# Patient Record
Sex: Female | Born: 1955 | Race: White | Hispanic: No | Marital: Married | Smoking: Former smoker
Health system: Southern US, Community
[De-identification: ages and names within clinical notes are randomized; demographics above are authoritative.]

## PROBLEM LIST (undated history)

## (undated) DIAGNOSIS — K219 Gastro-esophageal reflux disease without esophagitis: Secondary | ICD-10-CM

## (undated) DIAGNOSIS — F32A Depression, unspecified: Secondary | ICD-10-CM

## (undated) DIAGNOSIS — E119 Type 2 diabetes mellitus without complications: Secondary | ICD-10-CM

## (undated) DIAGNOSIS — F329 Major depressive disorder, single episode, unspecified: Secondary | ICD-10-CM

## (undated) DIAGNOSIS — G8929 Other chronic pain: Secondary | ICD-10-CM

## (undated) DIAGNOSIS — R569 Unspecified convulsions: Secondary | ICD-10-CM

## (undated) DIAGNOSIS — F419 Anxiety disorder, unspecified: Secondary | ICD-10-CM

## (undated) DIAGNOSIS — G629 Polyneuropathy, unspecified: Secondary | ICD-10-CM

## (undated) DIAGNOSIS — I10 Essential (primary) hypertension: Secondary | ICD-10-CM

## (undated) DIAGNOSIS — G4730 Sleep apnea, unspecified: Secondary | ICD-10-CM

## (undated) HISTORY — DX: Polyneuropathy, unspecified: G62.9

## (undated) HISTORY — PX: CHOLECYSTECTOMY: SHX55

## (undated) HISTORY — PX: ABDOMINAL HYSTERECTOMY: SHX81

## (undated) HISTORY — PX: BACK SURGERY: SHX140

## (undated) HISTORY — PX: CARPAL TUNNEL RELEASE: SHX101

## (undated) HISTORY — PX: PARS PLANA VITRECTOMY W/ REPAIR OF MACULAR HOLE: SHX2170

## (undated) HISTORY — DX: Sleep apnea, unspecified: G47.30

## (undated) HISTORY — DX: Morbid (severe) obesity due to excess calories: E66.01

## (undated) HISTORY — DX: Other chronic pain: G89.29

## (undated) HISTORY — DX: Anxiety disorder, unspecified: F41.9

---

## 2000-02-22 ENCOUNTER — Encounter: Payer: Self-pay | Admitting: Gastroenterology

## 2001-11-26 ENCOUNTER — Encounter: Payer: Self-pay | Admitting: Podiatry

## 2001-11-30 ENCOUNTER — Ambulatory Visit (HOSPITAL_COMMUNITY): Admission: RE | Admit: 2001-11-30 | Discharge: 2001-11-30 | Payer: Self-pay

## 2002-04-08 HISTORY — PX: FOOT SURGERY: SHX648

## 2002-05-10 ENCOUNTER — Ambulatory Visit (HOSPITAL_COMMUNITY): Admission: RE | Admit: 2002-05-10 | Discharge: 2002-05-10 | Payer: Self-pay | Admitting: Family Medicine

## 2002-05-10 ENCOUNTER — Encounter: Payer: Self-pay | Admitting: Family Medicine

## 2002-08-30 ENCOUNTER — Ambulatory Visit: Admission: RE | Admit: 2002-08-30 | Discharge: 2002-08-30 | Payer: Self-pay | Admitting: Family Medicine

## 2002-10-12 ENCOUNTER — Inpatient Hospital Stay (HOSPITAL_COMMUNITY): Admission: AD | Admit: 2002-10-12 | Discharge: 2002-10-16 | Payer: Self-pay | Admitting: Family Medicine

## 2002-10-12 ENCOUNTER — Encounter: Payer: Self-pay | Admitting: Family Medicine

## 2002-10-14 HISTORY — PX: COLONOSCOPY: SHX174

## 2002-12-31 ENCOUNTER — Encounter: Payer: Self-pay | Admitting: Neurological Surgery

## 2002-12-31 ENCOUNTER — Ambulatory Visit (HOSPITAL_COMMUNITY): Admission: RE | Admit: 2002-12-31 | Discharge: 2002-12-31 | Payer: Self-pay | Admitting: Neurological Surgery

## 2003-02-14 ENCOUNTER — Ambulatory Visit (HOSPITAL_COMMUNITY): Admission: RE | Admit: 2003-02-14 | Discharge: 2003-02-14 | Payer: Self-pay | Admitting: Family Medicine

## 2003-05-11 ENCOUNTER — Ambulatory Visit (HOSPITAL_COMMUNITY): Admission: RE | Admit: 2003-05-11 | Discharge: 2003-05-11 | Payer: Self-pay | Admitting: Family Medicine

## 2003-06-15 ENCOUNTER — Encounter (HOSPITAL_COMMUNITY): Admission: RE | Admit: 2003-06-15 | Discharge: 2003-07-15 | Payer: Self-pay | Admitting: Family Medicine

## 2004-05-23 ENCOUNTER — Ambulatory Visit (HOSPITAL_COMMUNITY): Admission: RE | Admit: 2004-05-23 | Discharge: 2004-05-23 | Payer: Self-pay | Admitting: Family Medicine

## 2004-10-03 ENCOUNTER — Ambulatory Visit (HOSPITAL_COMMUNITY): Admission: RE | Admit: 2004-10-03 | Discharge: 2004-10-03 | Payer: Self-pay | Admitting: Family Medicine

## 2004-11-07 ENCOUNTER — Ambulatory Visit (HOSPITAL_COMMUNITY): Admission: RE | Admit: 2004-11-07 | Discharge: 2004-11-07 | Payer: Self-pay | Admitting: Obstetrics & Gynecology

## 2004-11-19 ENCOUNTER — Encounter: Admission: RE | Admit: 2004-11-19 | Discharge: 2004-11-19 | Payer: Self-pay | Admitting: Family Medicine

## 2004-11-30 ENCOUNTER — Emergency Department (HOSPITAL_COMMUNITY): Admission: EM | Admit: 2004-11-30 | Discharge: 2004-12-01 | Payer: Self-pay | Admitting: *Deleted

## 2004-12-24 ENCOUNTER — Ambulatory Visit: Payer: Self-pay | Admitting: Internal Medicine

## 2005-01-02 ENCOUNTER — Encounter (INDEPENDENT_AMBULATORY_CARE_PROVIDER_SITE_OTHER): Payer: Self-pay | Admitting: *Deleted

## 2005-01-02 ENCOUNTER — Ambulatory Visit (HOSPITAL_COMMUNITY): Admission: RE | Admit: 2005-01-02 | Discharge: 2005-01-03 | Payer: Self-pay | Admitting: General Surgery

## 2005-03-25 ENCOUNTER — Ambulatory Visit (HOSPITAL_COMMUNITY): Admission: RE | Admit: 2005-03-25 | Discharge: 2005-03-25 | Payer: Self-pay | Admitting: Neurological Surgery

## 2005-04-15 ENCOUNTER — Ambulatory Visit (HOSPITAL_COMMUNITY): Admission: RE | Admit: 2005-04-15 | Discharge: 2005-04-15 | Payer: Self-pay | Admitting: Neurological Surgery

## 2005-05-30 ENCOUNTER — Inpatient Hospital Stay (HOSPITAL_COMMUNITY): Admission: RE | Admit: 2005-05-30 | Discharge: 2005-06-05 | Payer: Self-pay | Admitting: Neurological Surgery

## 2005-11-18 ENCOUNTER — Ambulatory Visit (HOSPITAL_COMMUNITY): Admission: RE | Admit: 2005-11-18 | Discharge: 2005-11-18 | Payer: Self-pay | Admitting: Family Medicine

## 2005-11-26 ENCOUNTER — Ambulatory Visit (HOSPITAL_COMMUNITY): Admission: RE | Admit: 2005-11-26 | Discharge: 2005-11-26 | Payer: Self-pay | Admitting: Neurological Surgery

## 2006-07-21 ENCOUNTER — Ambulatory Visit (HOSPITAL_COMMUNITY): Admission: RE | Admit: 2006-07-21 | Discharge: 2006-07-21 | Payer: Self-pay | Admitting: Family Medicine

## 2006-08-08 ENCOUNTER — Encounter: Admission: RE | Admit: 2006-08-08 | Discharge: 2006-08-08 | Payer: Self-pay | Admitting: Family Medicine

## 2006-08-12 ENCOUNTER — Encounter: Admission: RE | Admit: 2006-08-12 | Discharge: 2006-08-12 | Payer: Self-pay | Admitting: Family Medicine

## 2006-08-12 ENCOUNTER — Encounter (INDEPENDENT_AMBULATORY_CARE_PROVIDER_SITE_OTHER): Payer: Self-pay | Admitting: Specialist

## 2006-08-19 HISTORY — PX: BREAST BIOPSY: SHX20

## 2006-09-14 ENCOUNTER — Emergency Department (HOSPITAL_COMMUNITY): Admission: EM | Admit: 2006-09-14 | Discharge: 2006-09-15 | Payer: Self-pay | Admitting: Emergency Medicine

## 2007-04-15 ENCOUNTER — Ambulatory Visit (HOSPITAL_COMMUNITY): Admission: RE | Admit: 2007-04-15 | Discharge: 2007-04-15 | Payer: Self-pay | Admitting: Neurological Surgery

## 2007-09-29 ENCOUNTER — Ambulatory Visit (HOSPITAL_COMMUNITY): Admission: RE | Admit: 2007-09-29 | Discharge: 2007-09-29 | Payer: Self-pay | Admitting: Family Medicine

## 2008-04-06 ENCOUNTER — Emergency Department (HOSPITAL_COMMUNITY): Admission: EM | Admit: 2008-04-06 | Discharge: 2008-04-06 | Payer: Self-pay | Admitting: Emergency Medicine

## 2009-04-12 ENCOUNTER — Ambulatory Visit: Payer: Self-pay | Admitting: Gastroenterology

## 2009-04-12 DIAGNOSIS — Z8719 Personal history of other diseases of the digestive system: Secondary | ICD-10-CM | POA: Insufficient documentation

## 2010-02-06 ENCOUNTER — Ambulatory Visit (HOSPITAL_COMMUNITY): Admission: RE | Admit: 2010-02-06 | Discharge: 2010-02-06 | Payer: Self-pay | Admitting: Family Medicine

## 2010-03-08 ENCOUNTER — Ambulatory Visit (HOSPITAL_COMMUNITY): Admission: RE | Admit: 2010-03-08 | Discharge: 2010-03-08 | Payer: Self-pay | Admitting: Family Medicine

## 2010-04-28 ENCOUNTER — Encounter: Payer: Self-pay | Admitting: Family Medicine

## 2010-04-29 ENCOUNTER — Encounter: Payer: Self-pay | Admitting: Obstetrics & Gynecology

## 2010-04-29 ENCOUNTER — Encounter: Payer: Self-pay | Admitting: Family Medicine

## 2010-05-08 NOTE — Procedures (Signed)
Summary: EGD   EGD  Procedure date:  02/22/2000  Findings:      Findings: Esophagitis  Location: [facility]    EGD  Procedure date:  02/22/2000  Findings:      Findings: Esophagitis  Location: [facility]   Patient Name: Deborah Carroll, Deborah Carroll MRN:  Procedure Procedures: Panendoscopy (EGD) CPT: 43235.  Personnel: Endoscopist: Barbette Hair. Arlyce Dice, MD.  Referred By: Lilyan Punt, MD.  Exam Location: Exam performed in Outpatient Clinic. Outpatient  Patient Consent: Procedure, Alternatives, Risks and Benefits discussed, consent obtained, from patient.  Indications Symptoms: Chest Pain.  History  Pre-Exam Physical: Performed Feb 22, 2000  Cardio-pulmonary exam, HEENT exam, Abdominal exam, Extremity exam, Neurological exam, Mental status exam WNL.  Exam Exam Info: Maximum depth of insertion Stomach, intended Duodenum. Exam incomplete due to poor cooperation. ASA Classification: I. Tolerance: excellent.  Sedation Meds: Fentanyl 50 mcg. Versed 10 mg. Cetacaine Spray 1 sprays Robinul 0.2  Monitoring: BP and pulse monitoring done. Oximetry used. Supplemental O2 given  Findings ESOPHAGEAL INFLAMMATION: Comments: erosions.    Comments: Dilitation not attempted; pt poorly cooperative Assessment Abnormal examination, see findings above.  Diagnoses: 530.11: Esophagitis, Reflux.   Events  Unplanned Intervention: No unplanned interventions were required.  Unplanned Events: There were no complications. Plans Medication(s): PPI: Esomeprazole/Nexium 40 mg QD, starting Feb 22, 2000   Disposition: After procedure patient sent to recovery. After recovery patient sent home.  Scheduling: Office Visit, to [employer]. Arlyce Dice, MD, around Mar 21, 2000.    This report was created from the original endoscopy report, which was reviewed and signed by the above listed endoscopist.     cc. Tessie Fass

## 2010-05-08 NOTE — Letter (Signed)
Summary: Generic Letter  [facility]  [address] [place], [place] [address]   Phone: [phone]  Fax: [phone]    04/12/2009  Deborah Carroll [address] [place], [place]  [address]  Dear Ms. Joplin,  It is my pleasure to have treated you recently as a new patient in my office. I appreciate your confidence and the opportunity to participate in your care.  Since I do have a busy inpatient endoscopy schedule and office schedule, my office hours vary weekly. I am, however, available for emergency calls everyday through my office. If I am not available for an urgent office appointment, another one of our gastroenterologist will be able to assist you.  My well-trained staff are prepared to help you at all times. For emergencies after office hours, a physician from our Gastroenterology section is always available through my 24 hour answering service  Once again I welcome you as a new patient and I look forward to a happy and healthy relationship               Sincerely,   Melvia Heaps MD  Appended Document: Generic Letter letter mailed

## 2010-05-08 NOTE — Assessment & Plan Note (Signed)
Summary: consult before col...em   History of Present Illness Visit Type: Initial Consult Primary GI MD: Melvia Heaps MD [facility] Primary Provider: Lilyan Punt, MD Chief Complaint: Consult before Colonoscopy History of Present Illness:   [name] is a 55 year old white female referred at the request of Dr. Gerda Diss for possible colonoscopy.  In 2004 she underwent colonoscopy during a  hospitalization for abdominal pain.  Changes consistent with ischemic colitis were seen.  At that time she was taking a dietary patch.  She has had no GI complaints ever since.  Specifically, she is with a change in bowel habits, abdominal pain, melena or hematochezia.  She takes hydrocodone daily for back pain and tends to be constipated.   GI Review of Systems    Reports bloating and  weight gain.      Denies abdominal pain, acid reflux, belching, chest pain, dysphagia with liquids, dysphagia with solids, heartburn, loss of appetite, nausea, vomiting, vomiting blood, and  weight loss.      Reports change in bowel habits.     Denies anal fissure, black tarry stools, constipation, diarrhea, diverticulosis, fecal incontinence, heme positive stool, hemorrhoids, irritable bowel syndrome, jaundice, light color stool, liver problems, rectal bleeding, and  rectal pain.    Current Medications (verified): 1)  Dilantin 100 Mg Caps (Phenytoin Sodium Extended) .Marland Kitchen.. 1 Capsule By Mouth Two Times A Day 2)  Hydrochlorothiazide 25 Mg Tabs (Hydrochlorothiazide) .Marland Kitchen.. 1 Tablet By Mouth Once Daily 3)  Citalopram Hydrobromide 20 Mg Tabs (Citalopram Hydrobromide) .Marland Kitchen.. 1 Tablet By Mouth Once Daily 4)  Prilosec 20 Mg Cpdr (Omeprazole) .Marland Kitchen.. 1 Capsule By Mouth Once Daily 5)  Vitamin B-12 1000 Mcg Tabs (Cyanocobalamin) .Marland Kitchen.. 1 Tablet By Mouth Once Daily 6)  Cascara Sagrada 450 Mg Caps (Cascara Sagrada) .... 2 Capsules By Mouth Once Daily 7)  Adipex-P 37.5 Mg Caps (Phentermine Hcl) .Marland Kitchen.. 1 By Mouth Capsule Once Daily 8)   Hydrocodone-Acetaminophen 7.5-750 Mg Tabs (Hydrocodone-Acetaminophen) .... Take Q 4-6 Hours As Needed Pain  Allergies (verified): No Known Drug Allergies  Past History:  Past Medical History: seizure disorder urinary incontinence Esophagitis 2001 Depression Gallstones Obesity Seizures Sleep Apnea  Past Surgical History: c-section Hysterectomy Carpal Tunnel Release Tubal Ligation Back Surgery  Family History: No FH of Colon Cancer: Family History of Diabetes: MGM Family History of Heart Disease: MGM Aunt   Social History: Married  2 girls Occupation: Housewife Patient is a former smoker. quit 1980 Alcohol Use - no Daily Caffeine Use  1 per day Illicit Drug Use - no Smoking Status:  quit Drug Use:  no  Review of Systems       The patient complains of back pain, depression-new, hearing problems, heart rhythm changes, night sweats, sleeping problems, swelling of feet/legs, urine leakage, and vision changes.  The patient denies allergy/sinus, anemia, anxiety-new, arthritis/joint pain, blood in urine, breast changes/lumps, change in vision, confusion, cough, coughing up blood, fainting, fatigue, fever, headaches-new, heart murmur, itching, muscle pains/cramps, nosebleeds, shortness of breath, skin rash, sore throat, swollen lymph glands, thirst - excessive, urination - excessive, urination changes/pain, and voice change.         All other systems were reviewed and were negative   Vital Signs:  Patient profile:   55 year old female Height:      62 inches Weight:      211.25 pounds BMI:     38.78 Pulse rate:   84 / minute Pulse rhythm:   regular BP sitting:   130 / 84  (  left arm)  Vitals Entered By: Milford Cage NCMA (April 12, 2009 11:46 AM)  Physical Exam  Additional Exam:  She is a well-developed well-nourished female  skin: anicteric HEENT: normocephalic; PEERLA; no nasal or pharyngeal abnormalities neck: supple nodes: no cervical  lymphadenopathy chest: clear to ausculatation and percussion heart: no murmurs, gallops, or rubs abd: soft, nontender; BS normoactive; no abdominal masses, tenderness, organomegaly rectal: deferred ext: no cynanosis, clubbing, edema skeletal: no deformities neuro: oriented x 3; no focal abnormalities    Impression & Recommendations:  Problem # 1:  ISCHEMIC COLITIS, HX OF (ICD-V12.79) Patient does not have any ongoing bowel symptoms.  She is not at risk for undergoing GI disorders.  Recommendations #1 colonoscopy for colorectal cancer screening in 2014  Patient Instructions: 1)  CC Dr. Lilyan Punt

## 2010-08-24 NOTE — Consult Note (Signed)
NAME:  Deborah Carroll, Deborah Carroll                      ACCOUNT NO.:  [id number]   MEDICAL RECORD NO.:  [id number]                   PATIENT TYPE:  INP   LOCATION:  A320                                 FACILITY:  [facility]   PHYSICIAN:  Lionel December, M.D.                 DATE OF BIRTH:  [birth year]   DATE OF CONSULTATION:  10/12/2002  DATE OF DISCHARGE:                                   CONSULTATION   REFERRING PHYSICIAN:  Donna Bernard, M.D.   REASON FOR CONSULTATION:  Abdominal pain and leukocytosis.   HISTORY OF PRESENT ILLNESS:  Deborah Carroll is a 55 year old, Caucasian female  who was admitted to Dr. Zack Seal service earlier today for  evaluation and treatment of acute onset of abdominal pain.  She was in her  usual state of health until early this morning when she woke up with more or  less general abdominal pain and needed to have a bowel movement, but she is  not able to do so.  Pain increased in intensity.  She came to the emergency  room around 3 a.m.  While in the waiting area, she had a large bowel  movement.  She passed hard stool followed by normal and liquid stool.  She  felt better and therefore went home without being seen by ER physician.  However, her pain continued.  She was seen by Dr. Gerda Diss in the office and  noted to be in a lot of distress and therefore hospitalized.  She has been  started on Protonix and IV Unasyn.  She is also receiving Noora Ciel and IV  fluids for low potassium.  She states that she was fine when she went to bed  last night, although she had not had a BM x3 days.  This afternoon, she  noted some blood with her bowel movements and she also noted fresh blood on  tissue.  She did throw up after she took contrast for abdominal CT.  CT  shows thickened, distal transverse colon, splenic flexure and proximal  descending colon.  She has had diaphoresis when she has severe pain,  although she has not had fever.  She denies vaginal discharge or  discharge.  She denies recent weight loss or anorexia.  She has been trying to lose  weight and about two weeks ago, she started herself on this patch which is  natural ingredients.  However, she does not know what is in the patch.  She  did decide to take it off this morning.  She denies chronic heartburn or  dysphagia.   MEDICATIONS ON ADMISSION:  1. Dilantin 500 mg q.h.s.  2. Ibuprofen 800 mg q.d. for back pain.  3. Fluid pill, which was begun recently, but she does not remember the name.   PAST MEDICAL HISTORY:  1. Seizure disorder.  2. Migraines.  3. Back pain secondary to prolapsed disc and because of this, she has  been     on temporary Disability.  4. C-section x2.  5. Hysterectomy.  6. Decompression of carpal tunnel bilaterally.  7. Most recent surgery was foot surgery in August 2003.   ALLERGIES:  No known drug allergies.   FAMILY HISTORY:  Noncontributory.   SOCIAL HISTORY:  She works at [employer].  She is presently on short-term  Disability because of her back problems.  She is married and has two  children.  She never smoked cigarettes and she does not drink alcohol.   PHYSICAL EXAMINATION:  GENERAL:  Well-developed, moderately obese, Caucasian  female who appears to be in some distress.  VITAL SIGNS:  Weight 207.7 pounds, height 5 feet 2 inches, pulse 97, blood  pressure 144/81, respirations 30, temperature 101.1.  HEENT:  Conjunctivae is pink.  Sclerae nonicteric.  Oropharyngeal mucosa is  normal.  NECK:  Without thyromegaly.  CARDIAC:  Regular rate and rhythm.  Normal S1, S3.  No murmur or gallop  noted.  LUNGS:  Clear to auscultation.  ABDOMEN:  Protuberant.  Bowel sounds are normal.  Palpation reveals vague  tenderness which is more in the left lower quadrant, but more or less  generalized.  She has some guarding in the left upper quadrant, but no  rebound.  No organomegaly or masses.  RECTAL:  Deferred, as she just had one in the office.  EXTREMITIES:   No peripheral edema or clubbing.   LABORATORY DATA AND X-RAY FINDINGS:  WBC 20.1, 90% segs, H&H 15.2 and 44.2,  platelet count 260,000.  Sodium 133, potassium 2.5, chloride 97, CO2 25,  glucose 127, BUN 8, creatinine 0.6.  Bilirubin 0.8, Alk phos mildly elevated  at 160, AST 22, ALT 28, total protein 7.7 with albumin of 4.2 and calcium of  9.1.   Abdominal CT reviewed by the radiologist.  It shows thickened splenic  flexure, distal transverse colon and proximal descending colon.  Fatty  change in her liver.   ASSESSMENT:  Ms. Buda acute symptomatology and computed tomography  findings are suggestive of ischemic colitis on presentation consisting of  sudden onset of abdominal pain with bowel urgency, followed by frequent  bowel movements and rectal bleeding.  She does not have any risk factors  such as smoking, use of birth control pills or diabetes.  She did recently  go on this natural patch and I wonder what sort of medications it has in it  (she used this to try and lose weight).  I feel infectious colitis, colonic  neoplasm or diverticulitis would be highly unlikely.  I agree with idea of  giving her intravenous antibiotics until definite diagnosis has been  established.   RECOMMENDATIONS:  1. Will start her on morphine sulfate 4 mg IV every three hours as needed.  2. Will allow sips of clear liquids.  3. The patient will be reassessed and hopefully her condition will improve     dramatically.  4. Will plan colonoscopy within the next 48-72 hours.   I would like to thank Dr. Simone Curia for the opportunity to participate  in the care of this nice lady.                                               Lionel December, M.D.    NR/MEDQ  D:  10/12/2002  T:  10/13/2002  Job:  [id number]
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
## 2010-08-24 NOTE — Op Note (Signed)
NAME:  DAFNEY, FARLER NO.:  [id number]   MEDICAL RECORD NO.:  [id number]          PATIENT TYPE:  INP   LOCATION:  3309                         FACILITY:  [facility]   PHYSICIAN:  Stefani Dama, M.D.  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   PROCEDURES:  Bilateral diskectomy, L4-L5, posterior lumbar interbody fusion  with PEEK spacers, pedicle screw fixation, L4-L5, posterolateral arthrodesis  with local autograft and allograft, L4-L5.   SURGEON:  Stefani Dama, M.D.   FIRST ASSISTANT:  Cristi Loron, M.D.   ANESTHESIA:  General endotracheal.   INDICATIONS:  Deborah Carroll is a 55 year old individual who has had  significant back pain with bilateral lower extremity pain that has been  progressive over the last 3-4 years' time.  She had been treated with  conservative management, but notes that her level of function is continuing  to deteriorate despite conservative efforts.  She was advised regarding  surgical decompression and arthrodesis at the L4-L5 level.   PROCEDURE:  The patient was brought to the operating room supine on a  stretcher.  After the smooth induction of general endotracheal anesthesia,  she was turned prone.  The back was prepped with DuraPrep and draped in a  sterile fashion.  A midline incision was created and carried down to the  lumbodorsal fascia.  The fascia was opened on either side of the midline to  expose the first spinous process of L4.  The interlaminar space at L4-L5 was  then cleared.  A self-retaining retractor was placed in the wound and after  positively identifying each spinous process and interlaminar space of L4-L5  with radiography, bilateral  laminotomies were created, removing the inferior  margin of the lamina and medial wall of the facet of L4.  The thickened  redundant yellow ligament in this area was then taken up and the common  dural tube was exposed.  Dissection was carried out lateral to the common  dural tube and the disk space was identified.  Disk was noted be bulged  posteriorly significantly.  In this area, there was noted be calcific  overgrowth of the posterior longitudinal ligament and after opening into the  disk space with a 15 blade, a combination of curettes and rongeurs was used  to evacuate a substantial quantity of severely desiccated disk material.  The ligament itself was then taken up with a 2- and 3-mm Kerrison punch and  an osteophyte tool was used to remove the remnants of the osteophytes from  the inferior margin of the body of L4 and the superior margin of the body of  L5.  Disk space was cleared completely, first from the right side and then a  similar procedure was carried out from the left side.  The interspinous  ligament was loosened and this was removed and the interlaminar distractor  was used  in this area to allow for further visualization of the interspace.  With the disk largely being removed, a series of curettes were then used to  remove the cartilaginous endplates from L4 and L5.  A series of disk shapers  were then used up to the 9-mm size to remove bony endplate material and  opened into the marrow cavity at the L4-L5 space.  With this then, the  interspace was sized and it was felt that an 8-mm spacer would fit well in  the collapsed interspace.  PEEK spacers were then filled with a combination  of autograft and allograft; the autograft was the remnants of the  laminectomy and facetectomy that were performed.  This was mixed with 10 mL  of Alpha Grain demineralized bone matrix substitute and this combination was  placed into the interspace along with the PEEK cage, first placed on  the  right side and then on the left.  Pedicle entry sites were then chosen at L4  and L5.  These were checked radiographically with a fluoroscope and then the  L4 pedicle openings were tapped to the 5.5-mm size and 5.5 x 45-mm screws  were placed in L4, 6.5 x 45-mm screws were placed in L5; these connected was  short rods on the left side and a 30-mm pre-contoured rod was used and on  the right side, a 35-mm pre-contoured rod was used.  This was affixed into  the final position and then in the previously decorticated lateral gutters,  bone graft was packed.  Once this was all secured, care was taken to make  sure that the L4 nerve roots superiorly were well-decompressed out their  path out the foramen.  The L5 nerve roots were decompressed inferiorly and  hemostasis was then achieved in the soft tissues of the wound and the  lumbodorsal fascia was closed with #1 Vicryl in interrupted fashion, 2-0  Vicryl was used in the subcutaneous and subcuticular tissues, 3-0 Vicryl was  used to close the final subcuticular layer and Dermabond was used on the  skin.  Blood loss was estimated at 500 mL.      Stefani Dama, M.D.  Electronically Signed     HJE/MEDQ  D:  05/30/2005  T:  05/31/2005  Job:  [id number]

## 2010-08-24 NOTE — H&P (Signed)
NAME:  Deborah Carroll, Deborah Carroll NO.:  [id number]   MEDICAL RECORD NO.:  [id number]                  PATIENT TYPE:   LOCATION:  320                                  FACILITY:   PHYSICIAN:  Donna Bernard, M.D.             DATE OF BIRTH:   DATE OF ADMISSION:  10/12/2002  DATE OF DISCHARGE:                                HISTORY & PHYSICAL   CHIEF COMPLAINT:  Blood per stools, abdominal pain and pelvic pain.   SUBJECTIVE:  This patient is a 55 year old white female with a history of  seizure disorder.  Patient presented to the office initially with complaints  of severe lower abdominal and pelvic region pain, the onset was earlier this  morning.  The patient has had a couple of loose stools that were bloody in  nature.  She notes severe pain primarily in the low abdomen.  She has had  one episode of vomiting, mild nausea.  She has also had fever and chills.  No significant change in urinary habits.  Of note, I saw the patient in May  presented with a one day history of hematochezia without abdominal pain.  I  recommended a GI consultation at that time which was scheduled and the  patient did not keep it.  The patient presents in extremis and feeling very  much in discomfort.  She rates her pain as an 8 out of 10.   PRIOR SURGERY:  1. Remote carpal tunnel syndrome.  2. Remote C-section x 2.  3. Partial hysterectomy in 1999.   FAMILY HISTORY:  Positive for hypertension and coronary artery disease.   SOCIAL HISTORY:  Patient is married.  Does not smoke tobacco.  No alcohol  use.  Two children.   CURRENT MEDICATIONS:  1. Dilantin 100 mg five per day.  2. Hydrochlorothiazide 25 mg one daily.   ALLERGIES:  Patient notes no allergies.   REVIEW OF SYSTEMS:  Otherwise negative.   PHYSICAL EXAMINATION:  VITAL SIGNS:  Temp 99.8, weight 208.  GENERAL:  Patient is alert with some acute distress.  HEENT:  Normal.  NECK:  Supple.  LUNGS:  Clear.  HEART:   Regular rate and rhythm.  Tachycardic.  No significant murmur.  ABDOMEN:  Bowel sounds present.  Left lower abdominal tenderness.  Left  upper quadrant tenderness noted.  No rebound.  Some guarding.  EXTREMITIES:  Normal.  NEURO:  Intact.   SIGNIFICANT LABORATORY DATA:  CBC 20,000 white blood count, left shift.  MET-  7 potassium low at 2.5.   CT scan with contrast result reveals ischemic bowel changes in the left  sigmoid colon extending into the rectosigmoid region.   IMPRESSION:  1. Ischemic bowel disease.  Patient interestingly notes a recent over the     counter patch she is using for weight loss.  She is not sure of the     content.  She will bring this back to [place].  Currently she does not have a     surgical abdomen.  A long discussion was had with the family about the     nature of this disease including the low potential for serious     complications including infarction and the need for surgery.  At this     time we do not anticipate such a complication.  2. Seizure disorder - stable.  3. Hypokalemia - fairly severe.   PLAN:  Potassium supplementation, antibiotics, symptomatic care discussed,  GI consultation, close watch for further decline, appropriate pain control,  clear liquids, further orders as noted on the chart.                                                 Donna Bernard, M.D.    Karie Chimera  D:  10/12/2002  T:  10/13/2002  Job:  [id number]

## 2010-08-24 NOTE — Op Note (Signed)
NAME:  LASHEIKA, ORTLOFF                      ACCOUNT NO.:  [id number]   MEDICAL RECORD NO.:  [id number]                   PATIENT TYPE:  INP   LOCATION:  A320                                 FACILITY:  [facility]   PHYSICIAN:  Lionel December, M.D.                 DATE OF BIRTH:  November 24, 1955   DATE OF PROCEDURE:  10/14/2002  DATE OF DISCHARGE:                                  PROCEDURE NOTE   PROCEDURE:  Total colonoscopy.   INDICATIONS:  Mrs. Dantuono is a 55 year old Caucasian female who presented  with sudden onset of left-sided abdominal pain, bloody diarrhea and has  thickened colon on CT scan primarily in the descending colon and splenic  flexure area.  She is suspected to have a ischemic colitis.  She is  undergoing diagnostic colonoscopy.  Procedure was reviewed with the patient  and informed consent was obtained.   PREOPERATIVE MEDICATIONS:  Demerol 25 mg IV, Versed 4 mg IV.   FINDINGS:  The procedure was performed in the endoscopy suite.  The  patient's vital signs and O2 saturation were monitored during the procedure  and remained stable.  The patient was placed in the left lateral recumbent  position.  Rectal examination was performed.  No abnormality noted on  external or digital exam.  Olympus videoscope was placed in the rectum and  advanced under vision into the sigmoid colon and beyond.  There was patchy  erythema to distal descending colon.  However, as the scope was passed into  to proximal descending colon, there were patchy superficial ulcers which  progressed to become a large ulceration in the area of the splenic flexure.  Similar changes were also noted in the distal transverse colon.  Preparation  was satisfactory.  The scope was passed to the cecum which was identified by  the ileocecal valve.  Blunt cecum was also seen but not photographed.  As  the scope was withdrawn, mucosa was once again carefully examined.  Biopsies  were taken from the area of the  splenic flexure.  Mucosa of the sigmoid  colon and rectum was normal.  Scope was retroflexed to examine anorectal  junction and prominent papilla was noted.  Endoscope was then withdrawn.  The patient tolerated the procedure well.   FINAL DIAGNOSIS:  Ulceration involving descending colon, splenic flexure and  distal transverse colon with most extensive inflammation in the splenic  flexure area.  These changes are typical of ischemic colitis.  Biopsy taken.    RECOMMENDATIONS:  Advance diet to full liquids.  Give her Levsin/SLSE IV,  drop to 75 mL per hour.  Will go ahead and stop her Unasyn.                                               [employer]  Karilyn Cota, M.D.    NR/MEDQ  D:  10/14/2002  T:  10/14/2002  Job:  [id number]   cc:   Donna Bernard, M.D.  [address]  [place]  [place] [address]  Fax: [phone]

## 2010-08-24 NOTE — Discharge Summary (Signed)
NAMEMarland Carroll  DIANNAH, RINDFLEISCH NO.:  [id number]   MEDICAL RECORD NO.:  [id number]          PATIENT TYPE:  INP   LOCATION:  3030                         FACILITY:  [facility]   PHYSICIAN:  Stefani Dama, M.D.  DATE OF BIRTH:  1956-02-24   DATE OF ADMISSION:  05/30/2005  DATE OF DISCHARGE:  06/05/2005                                 DISCHARGE SUMMARY   ADMITTING DIAGNOSIS:  1.  Lumbar spondylosis and stenosis L4-L5.  2.  Lumbar radiculopathy.   DISCHARGE AND FINAL DIAGNOSIS:  1.  Lumbar spondylosis and stenosis L4-L5.  2.  Lumbar radiculopathy.   MAJOR OPERATION:  Bilateral diskectomy L4-L5, posterior interbody  arthrodesis with peak spacers, pedicle screw fixation L4-L5, posterolateral  arthrodesis with autograft and Allograft on May 30, 2005.   CONDITION ON DISCHARGE:  Improving.   HOSPITAL COURSE:  The patient is a 55 year old individual who has had  significant back and bilateral lower extremity pain secondary to spondylitic  degeneration at L4-L5.  She has failed extensive efforts at conservative  management and ultimately was taken to the operating room where she  underwent the above-named procedure.  Postoperatively she has been having  considerable difficulties with pain.  Postoperative CAT scan was performed  on the second postoperative day and this demonstrated placement of the  hardware was in good position.  There was no evidence of any bony  impingement on any of the nerve roots.  Pedicle screws were well-placed.  The patient was placed on some Decadron and this seemed to help ameliorate  her pain to a moderate degree.  Gradually with passage of time and  mobilization she was ambulated.  Her incision has been clean and dry and at  this time she is complaining primarily of right buttock pain, but otherwise  is neurologically intact.  The patient is given a prescription for Percocet  #60 without refills, Valium 5 mg #40 without refills, Decadron taper 0.75  mg  three times a day to be tapered every three days to off, Pepcid for  protection of her GI system and the patient is on potassium chloride 14 mEq  twice daily for hypokalemia which she has had since the time of admission.  She will be seen in the office in about two weeks' time.   CONDITION ON DISCHARGE:  Improving.      Stefani Dama, M.D.  Electronically Signed     HJE/MEDQ  D:  06/05/2005  T:  06/05/2005  Job:  [id number]

## 2010-08-24 NOTE — Op Note (Signed)
NAME:  Deborah Carroll, Deborah Carroll NO.:  [id number]   MEDICAL RECORD NO.:  [id number]                  PATIENT TYPE:   LOCATION:                                       FACILITY:   PHYSICIAN:  Emilio Math, M.D.                  DATE OF BIRTH:   DATE OF PROCEDURE:  11/30/2001  DATE OF DISCHARGE:                                 OPERATIVE REPORT   PREOPERATIVE DIAGNOSES:  1. Abductor varus rotation both feet.  2. Two verrucae 1 cm or greater right foot.   POSTOPERATIVE DIAGNOSES:  1. Abductor varus rotation both feet.  2. Two verrucae 1 cm or greater right foot.   PROCEDURE:  1. Derotation arthroplasty fifth toes bilaterally.  2. Excision of warts x2 right foot.   SURGEON:  Erven Colla, DPM   ANESTHESIA:  Local standby   DESCRIPTION OF PROCEDURE:  The patient brought to the operating room and  placed on the operating table in the supine position  The patient's lower  right foot and leg was then prepped and draped in the usual aseptic manner.  Then, with an ankle tourniquet placed and well-padded to prevent contusion;  elevated to 250 mmHg; the following surgical procedures were then performed  under monitored anesthesia care.   Derotation arthroplasty fifth toe right foot.  Attention directed to the  dorsal aspect of the fifth toe of the right foot where two semi-elliptical  converging skin incisions were made.  The incisions were underlined and  deepened via sharp and blunt dissection being sure to identify and retract  all vital structures.  Capsular incision was made in the head of the phalanx  freed from all soft tissue attachments and resected utilizing a Zimmer  oscillating saw.  The tendon was reapproximated utilizing 1 horizontal  mattress suture of 4-0 Dexon and the skin was approximated utilizing  horizontal mattress sutures of 4-0 Prolene.   Arthroplasty derotational fifth toe left foot.  A similar procedure as  described on the right foot  was performed on the left foot.  No addition or  deletions.   Excision of warts x2 right foot.  The two verruca on the plantar aspect of  the right foot were circumcised and then curetted utilizing a blunt curette.   All surgical sites were then infiltrated with approximately 1/8 cc of  dexamethasone phosphatase and mild compressive bandage consistent of  Betadine soaked Adaptic.  Sterile 4 x 4's and sterile Kling were then  applied.  The patient tolerated the procedure well and left the operating  room in apparent good condition.  Vital signs stable to the recovery room.                                                Emilio Math,  M.D.    DT/MEDQ  D:  11/30/2001  T:  12/01/2001  Job:  [id number]

## 2010-08-24 NOTE — Discharge Summary (Signed)
NAME:  Deborah Carroll, MAS NO.:  [id number]   MEDICAL RECORD NO.:  [id number]                  PATIENT TYPE:   LOCATION:                                       FACILITY:   PHYSICIAN:  Donna Bernard, M.D.             DATE OF BIRTH:   DATE OF ADMISSION:  DATE OF DISCHARGE:  10/16/2002                                 DISCHARGE SUMMARY   FINAL DIAGNOSES:  1. Ischemic colitis.  2. Seizure disorder.  3. Hypokalemia - resolved.   FINAL DISPOSITION:  1. Patient discharged to home.   DISCHARGE MEDICATIONS:  1. Patient maintained on Dilantin at the same dose.  2. Stop hydrochlorothiazide for now.  3. Stop ibuprofen for now.  4. Tylenol as needed for mild pain.  5. Vicodin as needed for more significant pain.  6. __________ as needed for abdominal cramps.   FOLLOW UP:  Followup with Dr. Lilyan Punt in approximately one week.  He  must report any severe abdominal pain.   INITIAL HISTORY AND PHYSICAL:  Please see H&P as dictated.   HOSPITAL COURSE:  This patient is a 55 year old white female with a history  of seizure disorder otherwise she has been in good health recently.  She has  been on hydrochlorothiazide 25 mg as needed for edema which she is taking  nearly every day.  The patient also is compliant with her Dilantin 100 mg 5  daily.  Over the couple days prior to admission, the patient had significant  abdominal discomfort in her lower abdomen and pelvic region.  Patient had  some loose stools.  She did note blood in her stools.  She had slight  nausea.  Interestingly, she had a similar spell but not as significant in  May.  At that time a GI consult was recommended and the patient did not keep  that appointment.  The pain upon presentation to the hospital was quite  severe, an 8-10.  The patient was admitted to the hospital.  We consulted  the GI folks who did a CT scan, this revealed significant ischemic bowel  changes in the left sigmoid  colon extending into the rectosigmoid colon.  Potassium supplementation was provided.  Initially we covered the patient  with IV antibiotics specifically Unison.  The patient's initial CBC was  significantly elevated with a white blood count of 20,000, normal  hemoglobin.  MET-7 showed a significantly low potassium at 2.5.  Liver  enzymes were fine other than a mild elevation of the alk phos.  Over the  next several days, the patient did improve significantly.  Dr. Karilyn Cota did a  colonoscopy which did confirmed changes of ischemic colitis.  We had brought  the  patient off her antibiotics.  We advanced her diet which she tolerated.  On  the day of discharge the patient was feeling better.  Her abdominal exam was  stable.  She was discharged home.  Diagnosis and disposition as noted above.                                               Donna Bernard, M.D.    Karie Chimera  D:  10/24/2002  T:  10/24/2002  Job:  [id number]

## 2010-08-24 NOTE — H&P (Signed)
   NAME:  Deborah Carroll, Deborah Carroll.:  [id number]   MEDICAL RECORD NO.:  [id number]                  PATIENT TYPE:   LOCATION:                                       FACILITY:   PHYSICIAN:  Emilio Math, M.D.                  DATE OF BIRTH:   DATE OF ADMISSION:  11/30/2001  DATE OF DISCHARGE:                                HISTORY & PHYSICAL   HISTORY OF PRESENT ILLNESS:  The patient is a 55 year old female who  presents to the office with a chief complaint of painful lesions of the  fifth toe of the right foot.  The patient relates that, along with a lesion  that she has on the right hallux, has been getting very painful.  She has  difficulty wearing enclosed shoes and would like to have this problem  surgically corrected.  Conservative care has consisted of debridement of the  lesions, wearing wider, larger shoes, without relief of symptomatology.  The  patient requests surgical correction of same.   PAST MEDICAL HISTORY:  Children x2, C-section x2, hysterectomy, carpal  tunnel.  No transfusions or hepatitis.   MEDICATIONS:  She takes Dilantin for seizure problems and migraine  headaches.   ALLERGIES:  No allergies.   REVIEW OF SYMPTOMS:  History of migraine headaches.   PHYSICAL EXAMINATION:  EXTREMITIES:  Lower extremity exam reveals palpable  pedal pulses at DP and PT, with spontaneous capillary filling time.  NEUROLOGIC:  Essentially within normal limits.  MUSCULOSKELETAL:  Musculoskeletal exam reveals a abductovarus rotation of  the fifth toe of the right foot with dorsal lateral lesions consistent with  a abductovarus rotation.  SKIN:  Dermatological exam reveals the patient to have a lesion on the  plantar aspect of the right hallux that is verrucoid in nature and  consistent with a verruca plantaris.  It is approximately 4 mm in diameter.   ASSESSMENT:  1. Abductovarus rotation, fifth toe, right foot.  2. Plantar wart.   PLAN:  The  patient will undergo surgical derotational arthroplasty with  excision of wart.  I reviewed the procedure with the patient, including  complications of the procedure such as infection, bone infection,  postoperative pain, swelling, etc.  The patient seems to understand the same  and surgery has been scheduled for 11/30/01.                                                Emilio Math, M.D.    DT/MEDQ  D:  11/29/2001  T:  11/29/2001  Job:  [id number]

## 2010-08-24 NOTE — Procedures (Signed)
NAME:  Deborah Carroll, Deborah Carroll NO.:  [id number]   MEDICAL RECORD NO.:  [id number]                  PATIENT TYPE:  PREC   LOCATION:                                       FACILITY:   PHYSICIAN:  Scott A. Gerda Diss, M.D.               DATE OF BIRTH:   DATE OF PROCEDURE:  06/15/2003  DATE OF DISCHARGE:                                    STRESS TEST   FINDINGS:  1. Normal sinus rhythm.  2. No acute changes on resting EKG.   PROTOCOL:  Bruce protocol.   INDICATION:  Chest discomfort.   HEART RATE RESPONSE TO EXERCISE:  Heart rate gradually increased and reached  a peak of 156 and Stage 3 of the test.  The test was ended at approximately  7:45 into the test.   ST SEGMENT RESPONSE TO EXERCISE:  There were no acute ST-segment depression.  Everything in that regards was normal.   ARRHYTHMIA:  Occasional PAC.   BLOOD PRESSURE RESPONSE:  The patient had a significant hypertensive  response to exercise, getting at 210/100 after having a resting blood  pressure of ____/90.   HEART RATE RESPONSE IN RECOVERY:  Gradually came down to normal.   SYMPTOMATOLOGY:  A little bit of shortness of breath and fatigue, but no  chest pressure.   INTERPRETATION:  Negative stress test, await Cardiolite.  Does have  hypertensive response.  Recommend for the patient to increase the amount of  exercise on a regular basis, after we get the Cardiolite back.      ___________________________________________                                            Jonna Coup. Gerda Diss, M.D.   SAL/MEDQ  D:  06/20/2003  T:  06/21/2003  Job:  [id number]

## 2010-08-24 NOTE — Op Note (Signed)
NAME:  Deborah Carroll, Deborah Carroll NO.:  [id number]   MEDICAL RECORD NO.:  [id number]          PATIENT TYPE:  INP   LOCATION:  3309                         FACILITY:  [facility]   PHYSICIAN:  Stefani Dama, M.D.  DATE OF BIRTH:  May 01, 1955   DATE OF PROCEDURE:  05/30/2005  DATE OF DISCHARGE:                                 OPERATIVE REPORT   PREOPERATIVE DIAGNOSIS:  Lumbar spondylosis with stenosis L4-L5 with lumbar  radiculopathy.   POSTOPERATIVE DIAGNOSIS:  Lumbar spondylosis with stenosis L4-L5 with lumbar  radiculopathy.   OPERATION:  Lumbar decompression L4-L5 with bilateral discectomy, posterior  interbody arthrodesis with PEEK bone spacer and local autograft and  allograft, pedicle screw fixation L4-L5 with posterolateral arthrodesis  local autograft and allograft.   SURGEON:  Stefani Dama, M.D.   FIRST ASSISTANT:  Cristi Loron, M.D.   ANESTHESIA:  General endotracheal.   INDICATIONS:  Deborah Carroll a 55 year old individual who has had significant back  and bilateral lower extremity pain.  She has evidence of significant  spondylitic disease at L4-L5 with a high grade stenosis.  Having failed  extensive efforts at conservative management over a long period of time, she  was advised regarding surgical intervention at L4-L5.   PROCEDURE:  The patient was brought to the operating room supine on the  stretcher.  After the smooth induction of general endotracheal anesthesia,  she was turned prone, the back was prepped with DuraPrep and draped in  sterile fashion.  A midline incision was created and carried down to the  lumbodorsal fascia which was opened on either side of midline.  The  interlaminar space at L4-L5 was cleared.  Confirmation of the interlaminar  space was obtained radiographically.  Laminotomies removing the inferior  margin lamina of L4 out to the mesial wall of the facets was performed,  first on the right side then on the left side.  The common  dural tube was  identified and dissected free and as the dissection was continued, there was  noted to be a substantial mass in the region of disc space elevating and  compressing the common dural tube.  The epidural veins in this area were  cauterized and divided and then using a 15 blade, the disc space itself was  opened.  There was significant calcific overgrowth at this point which was  accentuating the compression.  This was released with a 2 mm punch and by  dissecting through and into the disc space.  The disc space was opened and  evacuated of a modest quantity of significantly degenerated disc material.  The space was opened further by using an interspinous distractor at L4-L5  then discectomy could be completed more thoroughly and 2 and 3 mm Kerrison  punches were used to remove osteophytes from superior margin of the body of  L5 and inferior margin body of L4 to allow for the placement of some  osteotomes from the surgical Dynamic set so as to scrape the endplates.  The  procedure was then continued on the opposite side and decompression was  carried out here also.  In the  end, the disc space was curetted both on the  inferior margin of the body of L4 and superior margin body of L5 to make  sure there was good bony contact for the PEEK cage spacers.  By using trial  spacers, it was felt that an 8 mm spacer would give adequate opening to the  interspinous space and to the intradiscal space and ultimately an 8 mm x 25  mm PEEK bone spacer was filled with the patient's autograft and placed into  the interspace.  Attention was then turned to the pedicle entry sites which  chosen at L4 and L5 using fluoroscopic guidance.  When these were adequate.  6.5 by 45 mm screws were placed in the L5 vertebra, 5.5 by 45 mm screws were  used in L4 vertebra.  These were tapped, sounded and then placed  appropriately.  Then a 35 mm pre-contoured rod was used on the right side at  L4-L5 and on the  left side, a 30 mm pre -contoured rod was placed and the  system was connected and tightened down.  Care was taken to make sure that  the L4 nerve root superiorly and the L5 nerve roots inferiorly were well  decompressed.  Once this was ascertained, then posterolateral bone graft was  packed into the lateral gutters.  Hemostasis in the soft tissues was  obtained meticulously.  The system was checked for appropriate torque on  final tightening and then the lumbodorsal fascia was reapproximated with #1  Vicryl in an interrupted fashion, 2-0 Vicryl subcutaneous tissues, 3-0  Vicryl subcuticularly and Dermabond on the skin.  Blood loss was estimated  500 mL.  The patient tolerated procedure well and was returned to recovery  in stable condition.      Stefani Dama, M.D.  Electronically Signed     HJE/MEDQ  D:  05/31/2005  T:  06/01/2005  Job:  [id number]

## 2011-01-24 LAB — COMPREHENSIVE METABOLIC PANEL
ALT: 15
AST: 19
Albumin: 3.6
Alkaline Phosphatase: 155 — ABNORMAL HIGH
BUN: 11
CO2: 29
Calcium: 8.8
Chloride: 101
Creatinine, Ser: 0.7
GFR calc Af Amer: >60
GFR calc non Af Amer: >60
Glucose, Bld: 159 — ABNORMAL HIGH
Potassium: 2.8 — ABNORMAL LOW
Sodium: 138
Total Bilirubin: 0.2 — ABNORMAL LOW
Total Protein: 7.1

## 2011-01-24 LAB — DIFFERENTIAL
Basophils Absolute: 0.1
Basophils Relative: 1
Eosinophils Absolute: 0.4
Eosinophils Relative: 3
Lymphocytes Relative: 24
Lymphs Abs: 3.2
Monocytes Absolute: 0.8 — ABNORMAL HIGH
Monocytes Relative: 6
Neutro Abs: 8.6 — ABNORMAL HIGH
Neutrophils Relative %: 66

## 2011-01-24 LAB — CBC
HCT: 36.2
Hemoglobin: 12.5
MCHC: 34.5
MCV: 83.8
Platelets: 231
RBC: 4.32
RDW: 14.7 — ABNORMAL HIGH
WBC: 12.9 — ABNORMAL HIGH

## 2011-01-24 LAB — POCT CARDIAC MARKERS
CKMB, poc: <1 — ABNORMAL LOW
CKMB, poc: <1 — ABNORMAL LOW
Myoglobin, poc: 52.5
Myoglobin, poc: 57.4
Operator id: 208461
Operator id: 286941
Troponin i, poc: <0.05
Troponin i, poc: <0.05

## 2011-02-22 ENCOUNTER — Other Ambulatory Visit: Payer: Self-pay | Admitting: Family Medicine

## 2011-02-22 DIAGNOSIS — Z139 Encounter for screening, unspecified: Secondary | ICD-10-CM

## 2011-03-07 ENCOUNTER — Ambulatory Visit (HOSPITAL_COMMUNITY): Payer: Self-pay

## 2011-11-18 ENCOUNTER — Emergency Department (HOSPITAL_COMMUNITY): Payer: Medicare Other

## 2011-11-18 ENCOUNTER — Emergency Department (HOSPITAL_COMMUNITY)
Admission: EM | Admit: 2011-11-18 | Discharge: 2011-11-18 | Disposition: A | Discharge status: Home / Self Care | Payer: Medicare Other | Attending: Emergency Medicine | Admitting: Emergency Medicine

## 2011-11-18 ENCOUNTER — Encounter (HOSPITAL_COMMUNITY): Payer: Self-pay | Admitting: *Deleted

## 2011-11-18 DIAGNOSIS — R112 Nausea with vomiting, unspecified: Secondary | ICD-10-CM | POA: Insufficient documentation

## 2011-11-18 DIAGNOSIS — R109 Unspecified abdominal pain: Secondary | ICD-10-CM | POA: Insufficient documentation

## 2011-11-18 DIAGNOSIS — Z9089 Acquired absence of other organs: Secondary | ICD-10-CM | POA: Insufficient documentation

## 2011-11-18 DIAGNOSIS — R300 Dysuria: Secondary | ICD-10-CM | POA: Insufficient documentation

## 2011-11-18 DIAGNOSIS — M549 Dorsalgia, unspecified: Secondary | ICD-10-CM | POA: Insufficient documentation

## 2011-11-18 DIAGNOSIS — R10817 Generalized abdominal tenderness: Secondary | ICD-10-CM | POA: Insufficient documentation

## 2011-11-18 HISTORY — DX: Essential (primary) hypertension: I10

## 2011-11-18 HISTORY — DX: Depression, unspecified: F32.A

## 2011-11-18 HISTORY — DX: Major depressive disorder, single episode, unspecified: F32.9

## 2011-11-18 HISTORY — DX: Unspecified convulsions: R56.9

## 2011-11-18 LAB — CBC WITH DIFFERENTIAL/PLATELET
Basophils Absolute: 0 10*3/uL (ref 0.0–0.1)
Basophils Relative: 0 % (ref 0–1)
Eosinophils Absolute: 0.2 10*3/uL (ref 0.0–0.7)
Eosinophils Relative: 2 % (ref 0–5)
HCT: 38.6 % (ref 36.0–46.0)
Hemoglobin: 12.8 g/dL (ref 12.0–15.0)
Lymphocytes Relative: 19 % (ref 12–46)
Lymphs Abs: 2.2 10*3/uL (ref 0.7–4.0)
MCH: 27.9 pg (ref 26.0–34.0)
MCHC: 33.2 g/dL (ref 30.0–36.0)
MCV: 84.3 fL (ref 78.0–100.0)
Monocytes Absolute: 0.9 10*3/uL (ref 0.1–1.0)
Monocytes Relative: 8 % (ref 3–12)
Neutro Abs: 8.1 10*3/uL — ABNORMAL HIGH (ref 1.7–7.7)
Neutrophils Relative %: 71 % (ref 43–77)
Platelets: 160 10*3/uL (ref 150–400)
RBC: 4.58 MIL/uL (ref 3.87–5.11)
RDW: 13.6 % (ref 11.5–15.5)
WBC: 11.4 10*3/uL — ABNORMAL HIGH (ref 4.0–10.5)

## 2011-11-18 LAB — BASIC METABOLIC PANEL
BUN: 10 mg/dL (ref 6–23)
CO2: 28 mEq/L (ref 19–32)
Calcium: 9.3 mg/dL (ref 8.4–10.5)
Chloride: 101 mEq/L (ref 96–112)
Creatinine, Ser: 0.6 mg/dL (ref 0.50–1.10)
GFR calc Af Amer: >90 mL/min (ref >90)
GFR calc non Af Amer: >90 mL/min (ref >90)
Glucose, Bld: 128 mg/dL — ABNORMAL HIGH (ref 70–99)
Potassium: 2.9 mEq/L — ABNORMAL LOW (ref 3.5–5.1)
Sodium: 138 mEq/L (ref 135–145)

## 2011-11-18 LAB — HEPATIC FUNCTION PANEL
ALT: 13 U/L (ref 0–35)
AST: 12 U/L (ref 0–37)
Albumin: 3.5 g/dL (ref 3.5–5.2)
Alkaline Phosphatase: 130 U/L — ABNORMAL HIGH (ref 39–117)
Bilirubin, Direct: <0.1 mg/dL (ref 0.0–0.3)
Total Bilirubin: 0.4 mg/dL (ref 0.3–1.2)
Total Protein: 7.3 g/dL (ref 6.0–8.3)

## 2011-11-18 LAB — URINALYSIS, ROUTINE W REFLEX MICROSCOPIC
Bilirubin Urine: NEGATIVE
Glucose, UA: NEGATIVE mg/dL
Ketones, ur: NEGATIVE mg/dL
Leukocytes, UA: NEGATIVE
Nitrite: NEGATIVE
Protein, ur: NEGATIVE mg/dL
Specific Gravity, Urine: 1.015 (ref 1.005–1.030)
Urobilinogen, UA: 0.2 mg/dL (ref 0.0–1.0)
pH: 7 (ref 5.0–8.0)

## 2011-11-18 LAB — LIPASE, BLOOD: Lipase: 36 U/L (ref 11–59)

## 2011-11-18 LAB — URINE MICROSCOPIC-ADD ON

## 2011-11-18 MED ORDER — PROMETHAZINE HCL 25 MG PO TABS: 25.0000 mg | ORAL_TABLET | Freq: Four times a day (QID) | ORAL | Status: DC | PRN

## 2011-11-18 MED ORDER — POTASSIUM CHLORIDE CRYS ER 20 MEQ PO TBCR
40.0000 meq | EXTENDED_RELEASE_TABLET | Freq: Once | ORAL | Status: AC
  Administered 2011-11-18: 40 meq via ORAL
  Filled 2011-11-18: qty 2

## 2011-11-18 MED ORDER — IOHEXOL 300 MG/ML  SOLN
100.0000 mL | Freq: Once | INTRAMUSCULAR | Status: AC | PRN
  Administered 2011-11-18: 100 mL via INTRAVENOUS

## 2011-11-18 MED ORDER — ONDANSETRON HCL 4 MG/2ML IJ SOLN
4.0000 mg | Freq: Once | INTRAMUSCULAR | Status: AC
  Administered 2011-11-18: 4 mg via INTRAVENOUS
  Filled 2011-11-18: qty 2

## 2011-11-18 MED ORDER — OXYCODONE-ACETAMINOPHEN 5-325 MG PO TABS: 1.0000 | ORAL_TABLET | Freq: Four times a day (QID) | ORAL | Status: AC | PRN

## 2011-11-18 MED ORDER — SODIUM CHLORIDE 0.9 % IV BOLUS (SEPSIS)
1000.0000 mL | Freq: Once | INTRAVENOUS | Status: AC
  Administered 2011-11-18: 1000 mL via INTRAVENOUS

## 2011-11-18 MED ORDER — CEPHALEXIN 500 MG PO CAPS: 500.0000 mg | ORAL_CAPSULE | Freq: Four times a day (QID) | ORAL | Status: AC

## 2011-11-18 NOTE — ED Provider Notes (Signed)
History   This chart was scribed for Deborah Lennert, MD by Charolett Bumpers . The patient was seen in room APA18/APA18. Patient's care was started at 1215.    CSN: [id number]  Arrival date & time 11/18/11  1115   First MD Initiated Contact with Patient 11/18/11 1215      Chief Complaint  Patient presents with  . Abdominal Pain    (Consider location/radiation/quality/duration/timing/severity/associated sxs/prior treatment) HPI Comments: Deborah Carroll is a 56 y.o. female who presents to the Emergency Department complaining of constant, moderate generalized abdominal pain with an onset of 2 days ago. Pt reports associated n/v, dysuria, decreased appetite and back pain. Pt denies any diarrhea. Pt states last night, her back pain started radiating around sides, described as tightness. Pt reports a h/o cholecystectomy and back surgery.   PCP: Dr. Lilyan Punt   Patient is a 56 y.o. female presenting with abdominal pain.  Abdominal Pain The primary symptoms of the illness include abdominal pain, nausea, vomiting and dysuria. The primary symptoms of the illness do not include fatigue or diarrhea. The current episode started 2 days ago. The onset of the illness was sudden. The problem has been gradually worsening.  The abdominal pain is generalized.  The dysuria is not associated with hematuria or frequency.  Additional symptoms associated with the illness include back pain. Symptoms associated with the illness do not include hematuria or frequency.     Past Medical History  Diagnosis Date  . Seizures   . Hypertension   . Depression     Past Surgical History  Procedure Date  . Back surgery   . Cesarean section   . Abdominal hysterectomy   . Carpal tunnel release   . Cholecystectomy   . Foot surgery     History reviewed. No pertinent family history.  History  Substance Use Topics  . Smoking status: Never Smoker   . Smokeless tobacco: Not on file  . Alcohol Use:  Yes     occ    OB History    Grav Para Term Preterm Abortions TAB SAB Ect Mult Living                  Review of Systems  Constitutional: Positive for appetite change. Negative for fatigue.  HENT: Negative for congestion, sinus pressure and ear discharge.   Eyes: Negative for discharge.  Respiratory: Negative for cough.   Cardiovascular: Negative for chest pain.  Gastrointestinal: Positive for nausea, vomiting and abdominal pain. Negative for diarrhea.  Genitourinary: Positive for dysuria. Negative for frequency and hematuria.  Musculoskeletal: Positive for back pain.  Skin: Negative for rash.  Neurological: Negative for seizures and headaches.  Hematological: Negative.   Psychiatric/Behavioral: Negative for hallucinations.  All other systems reviewed and are negative.    Allergies  Review of patient's allergies indicates no known allergies.  Home Medications  No current outpatient prescriptions on file.  BP 142/83  Pulse 87  Temp 99.2 F (37.3 C) (Oral)  Resp 20  Ht 5\' 2"  (1.575 m)  Wt 220 lb (99.791 kg)  BMI 40.24 kg/m2  SpO2 98%  Physical Exam  Constitutional: She is oriented to person, place, and time. She appears well-developed.  HENT:  Head: Normocephalic and atraumatic.  Eyes: Conjunctivae and EOM are normal. No scleral icterus.  Neck: Neck supple. No thyromegaly present.  Cardiovascular: Normal rate, regular rhythm and normal heart sounds.  Exam reveals no gallop and no friction rub.   No murmur heard. Pulmonary/Chest:  Effort normal and breath sounds normal. No stridor. She has no wheezes. She has no rales. She exhibits no tenderness.  Abdominal: Soft. Bowel sounds are normal. She exhibits no distension. There is generalized tenderness. There is no rebound.       Minimal diffuse abdominal tenderness.   Musculoskeletal: Normal range of motion. She exhibits no edema.  Lymphadenopathy:    She has no cervical adenopathy.  Neurological: She is oriented to  person, place, and time. Coordination normal.  Skin: No rash noted. No erythema.  Psychiatric: She has a normal mood and affect. Her behavior is normal.    ED Course  Procedures (including critical care time)  DIAGNOSTIC STUDIES: Oxygen Saturation is 98% on room air, normal by my interpretation.    COORDINATION OF CARE:   12:20-Discussed planned course of treatment with the patient, who is agreeable at this time.  12:30-Medication Orders: Sodium chloride 0.9% bolus 1,000 mL-once.    Results for orders placed during the hospital encounter of 11/18/11  URINALYSIS, ROUTINE W REFLEX MICROSCOPIC      Component Value Range   Color, Urine YELLOW  YELLOW   APPearance CLEAR  CLEAR   Specific Gravity, Urine 1.015  1.005 - 1.030   pH 7.0  5.0 - 8.0   Glucose, UA NEGATIVE  NEGATIVE mg/dL   Hgb urine dipstick TRACE (*) NEGATIVE   Bilirubin Urine NEGATIVE  NEGATIVE   Ketones, ur NEGATIVE  NEGATIVE mg/dL   Protein, ur NEGATIVE  NEGATIVE mg/dL   Urobilinogen, UA 0.2  0.0 - 1.0 mg/dL   Nitrite NEGATIVE  NEGATIVE   Leukocytes, UA NEGATIVE  NEGATIVE  CBC WITH DIFFERENTIAL      Component Value Range   WBC 11.4 (*) 4.0 - 10.5 K/uL   RBC 4.58  3.87 - 5.11 MIL/uL   Hemoglobin 12.8  12.0 - 15.0 g/dL   HCT 16.1  09.6 - 04.5 %   MCV 84.3  78.0 - 100.0 fL   MCH 27.9  26.0 - 34.0 pg   MCHC 33.2  30.0 - 36.0 g/dL   RDW 40.9  81.1 - 91.4 %   Platelets 160  150 - 400 K/uL   Neutrophils Relative 71  43 - 77 %   Neutro Abs 8.1 (*) 1.7 - 7.7 K/uL   Lymphocytes Relative 19  12 - 46 %   Lymphs Abs 2.2  0.7 - 4.0 K/uL   Monocytes Relative 8  3 - 12 %   Monocytes Absolute 0.9  0.1 - 1.0 K/uL   Eosinophils Relative 2  0 - 5 %   Eosinophils Absolute 0.2  0.0 - 0.7 K/uL   Basophils Relative 0  0 - 1 %   Basophils Absolute 0.0  0.0 - 0.1 K/uL  BASIC METABOLIC PANEL      Component Value Range   Sodium 138  135 - 145 mEq/L   Potassium 2.9 (*) 3.5 - 5.1 mEq/L   Chloride 101  96 - 112 mEq/L   CO2 28  19  - 32 mEq/L   Glucose, Bld 128 (*) 70 - 99 mg/dL   BUN 10  6 - 23 mg/dL   Creatinine, Ser 7.82  0.50 - 1.10 mg/dL   Calcium 9.3  8.4 - 95.6 mg/dL   GFR calc non Af Amer >90  >90 mL/min   GFR calc Af Amer >90  >90 mL/min  HEPATIC FUNCTION PANEL      Component Value Range   Total Protein 7.3  6.0 -  8.3 g/dL   Albumin 3.5  3.5 - 5.2 g/dL   AST 12  0 - 37 U/L   ALT 13  0 - 35 U/L   Alkaline Phosphatase 130 (*) 39 - 117 U/L   Total Bilirubin 0.4  0.3 - 1.2 mg/dL   Bilirubin, Direct <6.2  0.0 - 0.3 mg/dL   Indirect Bilirubin NOT CALCULATED  0.3 - 0.9 mg/dL  LIPASE, BLOOD      Component Value Range   Lipase 36  11 - 59 U/L  URINE MICROSCOPIC-ADD ON      Component Value Range   Squamous Epithelial / LPF FEW (*) RARE   WBC, UA 0-2  <3 WBC/hpf   RBC / HPF 0-2  <3 RBC/hpf   Bacteria, UA FEW (*) RARE     Dg Abd Acute W/chest  11/18/2011  *RADIOLOGY REPORT*  Clinical Data: Upper abdominal pain and bloating  ACUTE ABDOMEN SERIES (ABDOMEN 2 VIEW & CHEST 1 VIEW)  Comparison: None.  Findings: There is a moderate amount of fecal matter in the colon. No evidence of ileus, obstruction nor free air.  There are clips in the right upper quadrant related previous cholecystectomy.  There has been previous fusion at L4-5.  No acute bony finding.  One-view chest shows normal heart and mediastinal shadows.  There is some linear scarring or atelectasis at the lung bases.  Upper lungs are clear.  No heart failure or effusions.  IMPRESSION: Moderate amount of fecal matter in the colon.  No evidence of ileus, obstruction or free air.  Original Report Authenticated By: Thomasenia Sales, M.D.     No diagnosis found.   Pt to follow up in 2 days with her md MDM  abd and flank pain.  Possible uti,  Will tx with keflex The chart was scribed for me under my direct supervision.  I personally performed the history, physical, and medical decision making and all procedures in the evaluation of this patient.Deborah Lennert, MD 11/18/11 [phone]

## 2011-11-18 NOTE — ED Notes (Signed)
abd pain , vomiting ,since Saturday,no diarrhea.  Dysuria,, chills last pam.  No rash

## 2011-11-21 LAB — URINE CULTURE: Colony Count: >=100000

## 2011-11-22 NOTE — ED Notes (Signed)
Results received from [facility]. (+) URNC  -> >/= 100,000 colonies -> E Coli.  Rx given in ED for  Keflex -> sensitive to the same.  Chart appended per protocol.  

## 2012-01-22 ENCOUNTER — Other Ambulatory Visit: Payer: Self-pay | Admitting: Family Medicine

## 2012-01-22 DIAGNOSIS — Z139 Encounter for screening, unspecified: Secondary | ICD-10-CM

## 2012-01-28 ENCOUNTER — Ambulatory Visit (HOSPITAL_COMMUNITY)
Admission: RE | Admit: 2012-01-28 | Discharge: 2012-01-28 | Disposition: A | Discharge status: Home / Self Care | Payer: Medicare Other | Source: Ambulatory Visit | Attending: Family Medicine | Admitting: Family Medicine

## 2012-01-28 DIAGNOSIS — Z1231 Encounter for screening mammogram for malignant neoplasm of breast: Secondary | ICD-10-CM | POA: Insufficient documentation

## 2012-01-28 DIAGNOSIS — Z139 Encounter for screening, unspecified: Secondary | ICD-10-CM

## 2012-06-16 ENCOUNTER — Other Ambulatory Visit: Payer: Self-pay | Admitting: Family Medicine

## 2012-06-16 ENCOUNTER — Ambulatory Visit (HOSPITAL_COMMUNITY)
Admission: RE | Admit: 2012-06-16 | Discharge: 2012-06-16 | Disposition: A | Discharge status: Home / Self Care | Payer: Medicare Other | Source: Ambulatory Visit | Attending: Family Medicine | Admitting: Family Medicine

## 2012-06-16 DIAGNOSIS — K5900 Constipation, unspecified: Secondary | ICD-10-CM

## 2012-06-16 DIAGNOSIS — K5909 Other constipation: Secondary | ICD-10-CM | POA: Insufficient documentation

## 2012-06-16 DIAGNOSIS — R109 Unspecified abdominal pain: Secondary | ICD-10-CM | POA: Insufficient documentation

## 2012-07-13 ENCOUNTER — Other Ambulatory Visit (HOSPITAL_COMMUNITY): Payer: Self-pay | Admitting: Family Medicine

## 2012-07-22 ENCOUNTER — Encounter: Payer: Self-pay | Admitting: Internal Medicine

## 2012-07-27 ENCOUNTER — Encounter: Payer: Self-pay | Admitting: Gastroenterology

## 2012-07-27 ENCOUNTER — Ambulatory Visit (INDEPENDENT_AMBULATORY_CARE_PROVIDER_SITE_OTHER): Payer: Medicare Other | Admitting: Gastroenterology

## 2012-07-27 VITALS — BP 112/69 | HR 67 | Temp 97.6°F | Ht 62.0 in | Wt 224.8 lb

## 2012-07-27 DIAGNOSIS — R198 Other specified symptoms and signs involving the digestive system and abdomen: Secondary | ICD-10-CM

## 2012-07-27 DIAGNOSIS — R194 Change in bowel habit: Secondary | ICD-10-CM | POA: Insufficient documentation

## 2012-07-27 DIAGNOSIS — K5900 Constipation, unspecified: Secondary | ICD-10-CM

## 2012-07-27 MED ORDER — POLYETHYLENE GLYCOL 3350 17 GM/SCOOP PO POWD: ORAL | Status: DC

## 2012-07-27 MED ORDER — PEG 3350-KCL-NA BICARB-NACL 420 G PO SOLR: 4000.0000 mL | ORAL | Status: DC

## 2012-07-27 NOTE — Progress Notes (Signed)
Primary Care Physician:  Lilyan Punt, MD  Primary Gastroenterologist:  Jonette Eva, MD   Chief Complaint  Patient presents with  . Colonoscopy  . Constipation    HPI:  Deborah Carroll is a 57 y.o. female here for change of bowel habits, to schedule colonoscopy. Her last colonoscopy was by Dr. Karilyn Cota in July of 2004 at which time she presented with left-sided abdominal pain and bloody diarrhea. Findings were consistent with ischemic colitis. Recently she started Lyrica. She developed abdominal fullness, difficulty passing a bowel movement. She reports she did not have a BM for six weeks. KUB was obtained which showed a significant amount of stool throughout her colon. Lab work showed normal CBC and ferritin. She tried FiberCon, Clinical biochemist, Geologist, engineering, Fleet suppositories for acute obstipation and states they did not produce a BM. Eventually had some relief with daily lactulose. She initially was on Lyrica 3 daily. Medications was stopped due to obstipation.  She developed stomach virus, just got over this one week ago. She stopped all of her medications for constipation at that time. Resumed colace three daily last week. Also started herself back on Lyrica one daily b/c of severe foot pain. She is having BMs, but Britsol 1.   Denies rectal bleeding, n/v, abd pain, heartburn, weight loss. She reports her thyroid function has been checked within the last one year by Dr. Gerda Diss.     Current Outpatient Prescriptions  Medication Sig Dispense Refill  . citalopram (CELEXA) 20 MG tablet Take 20 mg by mouth daily.      Marland Kitchen docusate sodium (COLACE) 100 MG capsule Take 100 mg by mouth 3 (three) times daily.      . hydrochlorothiazide (HYDRODIURIL) 25 MG tablet TAKE ONE TABLET BY MOUTH EVERY DAY  90 tablet  1  . phenytoin (DILANTIN) 100 MG ER capsule Take 100 mg by mouth. 3  Tablets in the am 2 tablets in pm      . potassium chloride (K-DUR,KLOR-CON) 10 MEQ tablet Take 10 mEq by mouth daily.      .  pregabalin (LYRICA) 50 MG capsule Take 50 mg by mouth daily.      . polyethylene glycol powder (MIRALAX) powder Take one capful twice a day for three days and then daily as needed for constipation.       No current facility-administered medications for this visit.    Allergies as of 07/27/2012  . (No Known Allergies)    Past Medical History  Diagnosis Date  . Seizures     last one 1991  . Hypertension   . Depression   . Sleep apnea     CPAP  . Anxiety   . Peripheral neuropathy     Past Surgical History  Procedure Laterality Date  . Back surgery    . Cesarean section    . Abdominal hysterectomy    . Carpal tunnel release      Bilateral  . Cholecystectomy    . Foot surgery  2004  . Colonoscopy  10/14/2002    HQI:[id number] involving descending colon, splenic flexure and distal transverse colon with most extensive inflammation in the splenic flexure area.  These changes are typical of ischemic colitis.  Biopsy taken.    Family History  Problem Relation Age of Onset  . Colon cancer Neg Hx   . Liver disease Neg Hx     History   Social History  . Marital Status: Married    Spouse Name: N/A    Number of Children: 2  .  Years of Education: N/A   Occupational History  . disability    Social History Main Topics  . Smoking status: Never Smoker   . Smokeless tobacco: Not on file  . Alcohol Use: Yes     Comment: occ  . Drug Use: No  . Sexually Active: Yes    Birth Control/ Protection: Surgical   Other Topics Concern  . Not on file   Social History Narrative  . No narrative on file      ROS:  General: Negative for anorexia, weight loss, fever, chills, fatigue, weakness. Eyes: Negative for vision changes.  ENT: Negative for hoarseness, difficulty swallowing , nasal congestion. CV: Negative for chest pain, angina, palpitations, dyspnea on exertion, peripheral edema.  Respiratory: Negative for dyspnea at rest, dyspnea on exertion, cough, sputum, wheezing.   GI: See history of present illness. GU:  Negative for dysuria, hematuria, urinary incontinence, urinary frequency, nocturnal urination.  MS: chronic foot pain since surgery 2004, no low back pain.  Derm: Negative for rash or itching.  Neuro: Negative for weakness, abnormal sensation, seizure, frequent headaches, memory loss, confusion.  Psych: Negative for anxiety, depression, suicidal ideation, hallucinations.  Endo: Negative for unusual weight change.  Heme: Negative for bruising or bleeding. Allergy: Negative for rash or hives.    Physical Examination:  BP 112/69  Pulse 67  Temp(Src) 97.6 F (36.4 C) (Oral)  Ht 5\' 2"  (1.575 m)  Wt 224 lb 12.8 oz (101.969 kg)  BMI 41.11 kg/m2   General: Well-nourished, well-developed in no acute distress. obese Head: Normocephalic, atraumatic.   Eyes: Conjunctiva pink, no icterus. Mouth: Oropharyngeal mucosa moist and pink , no lesions erythema or exudate. Neck: Supple without thyromegaly, masses, or lymphadenopathy.  Lungs: Clear to auscultation bilaterally.  Heart: Regular rate and rhythm, no murmurs rubs or gallops.  Abdomen: Bowel sounds are normal, nontender, nondistended, no hepatosplenomegaly or masses, no abdominal bruits or    hernia , no rebound or guarding.   Rectal: defer Extremities: No lower extremity edema. No clubbing or deformities.  Neuro: Alert and oriented x 4 , grossly normal neurologically.  Skin: Warm and dry, no rash or jaundice.   Psych: Alert and cooperative, normal mood and affect.  Labs: Labs from 06/15/2012. Ferritin 93, white blood cell count 9400, hemoglobin 14.7, hematocrit 42.7, platelets 246,000.  Imaging Studies: CT A/P with contrast 11/2011  IMPRESSION:  1. No explanation for patient's abdominal pain, nausea and  vomiting.  2. The urinary bladder is mildly thick-walled, possibly secondary  to underdistension, but may be seen in the setting of cystitis.  Correlation with urinalysis may be performed  as clinically  indicated. No renal stones or evidence of urinary obstruction.  3. Moderate colonic stool burden without evidence of obstruction.  Normal appearance of the appendix.  4. Post cholecystectomy.  5. Stable postsurgical change of L4 - L5.

## 2012-07-27 NOTE — Assessment & Plan Note (Signed)
57 year old lady with recent severe constipation/obstipation after starting Lyrica 50mg , 3 daily for chronic foot pain. Chronically she has a tendency towards constipation. However, after starting Lyrica, she developed a six-week period of time without being able to pass gas or bowel movement. She came off the medication, however after having recurrent foot pain she resumed a Lyrica at once daily. She is noticing that her stools are becoming harder, [place] stool 1. She really "needs" to stay on Lyrica for relief of her chronic foot pain. Previously did not use a daily bowel regimen for chronic constipation. For bowel habit change, offer her colonoscopy at this time. Her last colonoscopy was in 2004 at which time she had ischemic colitis.  I have discussed the risks, alternatives, benefits with regards to but not limited to the risk of reaction to medication, bleeding, infection, perforation and the patient is agreeable to proceed. Written consent to be obtained.  For chronic constipation, she will take MiraLax. 1 capful twice daily for 3 days followed by 1 capful daily as needed. She has been instructed to make sure her bowels are moving well prior to her bowel prep for colonoscopy. If not she will call the office. She has been told that she can use Dulcolax 10 mg daily for several days prior to her bowel prep if needed.  With regards to resuming Lyrica, I have asked her to continue current dose for couple weeks until we can determine the effects on her bowel habits. Hopefully with combination of daily MiraLax she will be able to continue Lyrica. If tolerated she may consider increasing Lyrica to twice daily.

## 2012-07-27 NOTE — Progress Notes (Signed)
CC PCP 

## 2012-07-27 NOTE — Patient Instructions (Addendum)
We have scheduled you for a colonoscopy with Dr. Darrick Penna. Please see separate instruction. Make sure you are having good bowel movements leading up to your bowel prep, if not please call the office for advice. Please start MiraLax daily as needed for constipation. Use twice a day for the first 3 days, then use once daily on days you do not have a good, soft bowel movement. You may continue Lyrica as long as we can manage your constipation.   Constipation, Adult Constipation is when a person has fewer than 3 bowel movements a week; has difficulty having a bowel movement; or has stools that are dry, hard, or larger than normal. As people grow older, constipation is more common. If you try to fix constipation with medicines that make you have a bowel movement (laxatives), the problem may get worse. Long-term laxative use may cause the muscles of the colon to become weak. A low-fiber diet, not taking in enough fluids, and taking certain medicines may make constipation worse. CAUSES   Certain medicines, such as antidepressants, pain medicine, iron supplements, antacids, and water pills.   Certain diseases, such as diabetes, irritable bowel syndrome (IBS), thyroid disease, or depression.   Not drinking enough water.   Not eating enough fiber-rich foods.   Stress or travel.  Lack of physical activity or exercise.  Not going to the restroom when there is the urge to have a bowel movement.  Ignoring the urge to have a bowel movement.  Using laxatives too much. SYMPTOMS   Having fewer than 3 bowel movements a week.   Straining to have a bowel movement.   Having hard, dry, or larger than normal stools.   Feeling full or bloated.   Pain in the lower abdomen.  Not feeling relief after having a bowel movement. DIAGNOSIS  Your caregiver will take a medical history and perform a physical exam. Further testing may be done for severe constipation. Some tests may include:   A barium  enema X-ray to examine your rectum, colon, and sometimes, your small intestine.  A sigmoidoscopy to examine your lower colon.  A colonoscopy to examine your entire colon. TREATMENT  Treatment will depend on the severity of your constipation and what is causing it. Some dietary treatments include drinking more fluids and eating more fiber-rich foods. Lifestyle treatments may include regular exercise. If these diet and lifestyle recommendations do not help, your caregiver may recommend taking over-the-counter laxative medicines to help you have bowel movements. Prescription medicines may be prescribed if over-the-counter medicines do not work.  HOME CARE INSTRUCTIONS   Increase dietary fiber in your diet, such as fruits, vegetables, whole grains, and beans. Limit high-fat and processed sugars in your diet, such as [place] fries, hamburgers, cookies, candies, and soda.   A fiber supplement may be added to your diet if you cannot get enough fiber from foods.   Drink enough fluids to keep your urine clear or pale yellow.   Exercise regularly or as directed by your caregiver.   Go to the restroom when you have the urge to go. Do not hold it.  Only take medicines as directed by your caregiver. Do not take other medicines for constipation without talking to your caregiver first. SEEK IMMEDIATE MEDICAL CARE IF:   You have bright red blood in your stool.   Your constipation lasts for more than 4 days or gets worse.   You have abdominal or rectal pain.   You have thin, pencil-like stools.  You have  unexplained weight loss. MAKE SURE YOU:   Understand these instructions.  Will watch your condition.  Will get help right away if you are not doing well or get worse. Document Released: 12/22/2003 Document Revised: 06/17/2011 Document Reviewed: 02/26/2011 [facility] Patient Information 2013 [place], [place].

## 2012-07-30 ENCOUNTER — Encounter (HOSPITAL_COMMUNITY): Payer: Self-pay | Admitting: Pharmacy Technician

## 2012-08-04 ENCOUNTER — Encounter (HOSPITAL_COMMUNITY): Payer: Self-pay | Admitting: *Deleted

## 2012-08-04 ENCOUNTER — Ambulatory Visit (HOSPITAL_COMMUNITY)
Admission: RE | Admit: 2012-08-04 | Discharge: 2012-08-04 | Disposition: A | Discharge status: Home / Self Care | Payer: Medicare Other | Source: Ambulatory Visit | Attending: Gastroenterology | Admitting: Gastroenterology

## 2012-08-04 ENCOUNTER — Encounter (HOSPITAL_COMMUNITY)
Admission: RE | Disposition: A | Discharge status: Home / Self Care | Payer: Self-pay | Source: Ambulatory Visit | Attending: Gastroenterology

## 2012-08-04 DIAGNOSIS — K6389 Other specified diseases of intestine: Secondary | ICD-10-CM

## 2012-08-04 DIAGNOSIS — R198 Other specified symptoms and signs involving the digestive system and abdomen: Secondary | ICD-10-CM | POA: Insufficient documentation

## 2012-08-04 DIAGNOSIS — D126 Benign neoplasm of colon, unspecified: Secondary | ICD-10-CM | POA: Insufficient documentation

## 2012-08-04 DIAGNOSIS — R194 Change in bowel habit: Secondary | ICD-10-CM

## 2012-08-04 DIAGNOSIS — G4733 Obstructive sleep apnea (adult) (pediatric): Secondary | ICD-10-CM | POA: Insufficient documentation

## 2012-08-04 DIAGNOSIS — Z8371 Family history of colonic polyps: Secondary | ICD-10-CM | POA: Insufficient documentation

## 2012-08-04 DIAGNOSIS — Z83719 Family history of colon polyps, unspecified: Secondary | ICD-10-CM | POA: Insufficient documentation

## 2012-08-04 DIAGNOSIS — K5900 Constipation, unspecified: Secondary | ICD-10-CM

## 2012-08-04 DIAGNOSIS — I10 Essential (primary) hypertension: Secondary | ICD-10-CM | POA: Insufficient documentation

## 2012-08-04 HISTORY — PX: COLONOSCOPY: SHX5424

## 2012-08-04 SURGERY — COLONOSCOPY
Anesthesia: Moderate Sedation

## 2012-08-04 MED ORDER — MEPERIDINE HCL 100 MG/ML IJ SOLN
INTRAMUSCULAR | Status: DC | PRN
  Administered 2012-08-04: 50 mg via INTRAVENOUS
  Administered 2012-08-04: 25 mg via INTRAVENOUS

## 2012-08-04 MED ORDER — PROMETHAZINE HCL 25 MG/ML IJ SOLN
12.5000 mg | Freq: Once | INTRAMUSCULAR | Status: AC
  Administered 2012-08-04: 12.5 mg via INTRAVENOUS

## 2012-08-04 MED ORDER — PROMETHAZINE HCL 25 MG/ML IJ SOLN
INTRAMUSCULAR | Status: DC | PRN
  Administered 2012-08-04: 12.5 mg via INTRAVENOUS

## 2012-08-04 MED ORDER — PROMETHAZINE HCL 25 MG/ML IJ SOLN
INTRAMUSCULAR | Status: AC
  Filled 2012-08-04: qty 1

## 2012-08-04 MED ORDER — MIDAZOLAM HCL 5 MG/5ML IJ SOLN
INTRAMUSCULAR | Status: AC
  Filled 2012-08-04: qty 10

## 2012-08-04 MED ORDER — SODIUM CHLORIDE 0.9 % IJ SOLN
INTRAMUSCULAR | Status: AC
  Filled 2012-08-04: qty 10

## 2012-08-04 MED ORDER — MEPERIDINE HCL 100 MG/ML IJ SOLN
INTRAMUSCULAR | Status: AC
  Filled 2012-08-04: qty 2

## 2012-08-04 MED ORDER — MIDAZOLAM HCL 5 MG/5ML IJ SOLN
INTRAMUSCULAR | Status: DC | PRN
  Administered 2012-08-04 (×2): 2 mg via INTRAVENOUS

## 2012-08-04 MED ORDER — STERILE WATER FOR IRRIGATION IR SOLN
Status: DC | PRN
  Administered 2012-08-04: 12:00:00

## 2012-08-04 MED ORDER — SODIUM CHLORIDE 0.9 % IV SOLN
INTRAVENOUS | Status: DC
  Administered 2012-08-04: 12:00:00 via INTRAVENOUS

## 2012-08-04 NOTE — Op Note (Signed)
[facility] [address] [place], [address]   COLONOSCOPY PROCEDURE REPORT  PATIENT: Deborah, Carroll  MR#: [id number] BIRTHDATE: [birth year] , 56  yrs. old GENDER: Female ENDOSCOPIST: Jonette Eva, MD REFERRED XB:JYNWG Gerda Diss, M.D. PROCEDURE DATE:  08/04/2012 PROCEDURE:   Colonoscopy with cold biopsy polypectomy and Colonoscopy with biopsy INDICATIONS:Average risk patient for colon cancer.  MOTHER HAD POLYPS AT AGE 56. PMHX: CONSTIPATION WORSE WITH LYRICA(2014). CT AUG 2013: MOD STOOL BURDEN, NL LIVER, BMI > 40(HFP AUG 2013 NL) MEDICATIONS: Demerol 75 mg IV, Versed 4 mg IV, Promethazine (Phenergan) 12.5mg  IV, and PREOP: Promethazine (Phenergan) 12.5mg  IV  DESCRIPTION OF PROCEDURE:    Physical exam was performed.  Informed consent was obtained from the patient after explaining the benefits, risks, and alternatives to procedure.  The patient was connected to monitor and placed in left lateral position. Continuous oxygen was provided by nasal cannula and IV medicine administered through an indwelling cannula.  After administration of sedation and rectal exam, the patients rectum was intubated and the EC-3890Li ([id number])  colonoscope was advanced under direct visualization to the cecum.  The scope was removed slowly by carefully examining the color, texture, anatomy, and integrity mucosa on the way out.  The patient was recovered in endoscopy and discharged home in satisfactory condition.    COLON FINDINGS: POLYPOID LESION IN AT THE APPENDICEAL ORIFICE.  COLD FORCEPS BIOPSIES OBTAINED, A sessile polyp measuring 3 mm in size was found in the descending colon.  A polypectomy was performed with cold forceps.  , Moderate melanosis was found throughout the entire examined colon.  , and The colon IS redundant.  Manual abdominal counter-pressure was used to reach the cecum.  PREP QUALITY: good    CECAL W/D TIME: 13 minutes     COMPLICATIONS: None  ENDOSCOPIC  IMPRESSION: 1.   POLYPOID LESION IN AT THE APPENDICEAL ORIFICE. 2.   Polyp  in the descending colon 3.   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   The colon IS redundant   RECOMMENDATIONS: TAKE MIRALAX DAILY TO PREVENT CONSTIPATION. DRINK WATER TO KEEP URINE LIGHT YELLOW. FOLLOW A LOW FAT/HIGH FIBER DIET.  AVOID ITEMS THAT CAUSE BLOATING  GAS. LOSE WEIGHT. BIOPSY RESULTS SHOULD BE BACK IN 7 DAYS. FOLLOW UP IN 6 MOS. Next colonoscopy in 10 years WITH AN OVERTUBE.       _______________________________ Rosalie DoctorJonette Eva, MD 08/04/2012 1:06 PM     PATIENT NAME:  Deborah, Carroll MR#: [id number]

## 2012-08-04 NOTE — H&P (Signed)
Primary Care Physician:  Lilyan Punt, MD Primary Gastroenterologist:  Dr. Darrick Penna  Pre-Procedure History & Physical: HPI:  Deborah Carroll is a 57 y.o. female here for CHANGE IN BOWEL HABITS.  Past Medical History  Diagnosis Date  . Seizures     last one 1991  . Hypertension   . Depression   . Sleep apnea     CPAP  . Anxiety   . Peripheral neuropathy     Past Surgical History  Procedure Laterality Date  . Back surgery    . Cesarean section      x2  . Abdominal hysterectomy    . Carpal tunnel release      Bilateral  . Cholecystectomy    . Foot surgery  2004  . Colonoscopy  10/14/2002    [id number] involving descending colon, splenic flexure and distal transverse colon with most extensive inflammation in the splenic flexure area.  These changes are typical of ischemic colitis.  Biopsy taken.    Prior to Admission medications   Medication Sig Start Date End Date Taking? Authorizing Provider  citalopram (CELEXA) 20 MG tablet Take 20 mg by mouth daily.   Yes Historical Provider, MD  docusate sodium (COLACE) 100 MG capsule Take 100 mg by mouth 3 (three) times daily.   Yes Historical Provider, MD  hydrochlorothiazide (HYDRODIURIL) 25 MG tablet TAKE ONE TABLET BY MOUTH EVERY DAY 07/13/12  Yes Babs Sciara, MD  phenytoin (DILANTIN) 100 MG ER capsule Take 200-300 mg by mouth 2 (two) times daily. 3  Tablets in the am 2 tablets in pm   Yes Historical Provider, MD  polyethylene glycol powder (MIRALAX) powder Take one capful twice a day for three days and then daily as needed for constipation. 07/27/12  Yes Tiffany Kocher, PA-C  polyethylene glycol-electrolytes (TRILYTE) 420 G solution Take 4,000 mLs by mouth as directed. 07/27/12  Yes West Bali, MD  potassium chloride (K-DUR,KLOR-CON) 10 MEQ tablet Take 10 mEq by mouth daily.   Yes Historical Provider, MD  pregabalin (LYRICA) 50 MG capsule Take 50 mg by mouth daily.   Yes Historical Provider, MD    Allergies as of 07/27/2012   . (No Known Allergies)    Family History  Problem Relation Age of Onset  . Colon cancer Neg Hx   . Liver disease Neg Hx   . Colon polyps Mother     History   Social History  . Marital Status: Married    Spouse Name: N/A    Number of Children: 2  . Years of Education: N/A   Occupational History  . disability    Social History Main Topics  . Smoking status: Never Smoker   . Smokeless tobacco: Not on file  . Alcohol Use: Yes     Comment: occ  . Drug Use: No  . Sexually Active: Yes    Birth Control/ Protection: Surgical   Other Topics Concern  . Not on file   Social History Narrative  . No narrative on file    Review of Systems: See HPI, otherwise negative ROS   Physical Exam: BP 134/82  Temp(Src) 97.4 F (36.3 C) (Oral)  Resp 16  Ht 5\' 2"  (1.575 m)  Wt 220 lb (99.791 kg)  BMI 40.23 kg/m2  SpO2 95% General:   Alert,  pleasant and cooperative in NAD Head:  Normocephalic and atraumatic. Neck:  Supple; Lungs:  Clear throughout to auscultation.    Heart:  Regular rate and rhythm. Abdomen:  Soft, nontender  and nondistended. Normal bowel sounds, without guarding, and without rebound.   Neurologic:  Alert and  oriented x4;  grossly normal neurologically.  Impression/Plan:    CHANGE IN BOWEL HABITS  Plan: 1. TCS TODAY

## 2012-08-06 ENCOUNTER — Encounter (HOSPITAL_COMMUNITY): Payer: Self-pay | Admitting: Gastroenterology

## 2012-08-07 ENCOUNTER — Encounter: Payer: Self-pay | Admitting: *Deleted

## 2012-08-12 ENCOUNTER — Telehealth: Payer: Self-pay | Admitting: Gastroenterology

## 2012-08-12 NOTE — Telephone Encounter (Signed)
Results forwarded to PCP

## 2012-08-12 NOTE — Telephone Encounter (Signed)
LMOM to call.

## 2012-08-12 NOTE — Telephone Encounter (Signed)
Please call pt. She had a simple adenoma removed from her colon.   TAKE MIRALAX DAILY TO PREVENT CONSTIPATION.  DRINK WATER TO KEEP URINE LIGHT YELLOW.  FOLLOW A LOW FAT/HIGH FIBER DIET. AVOID ITEMS THAT CAUSE BLOATING & GAS.  CONTINUE YOUR WEIGHT LOSS EFFORTS.  FOLLOW UP IN 4 MOS W/ LL DX: CONSTIPATION.  Next colonoscopy in 10 years.

## 2012-08-13 LAB — CBC
Ferritin: 93
HCT: 43 %
Hemoglobin: 14.7 g/dL (ref 12.0–16.0)
MCV: 85.2 fL
WBC: 9.4
platelet count: 246

## 2012-08-13 NOTE — Telephone Encounter (Signed)
Pt aware or results.  Darl Pikes can you NIC for 4 month follow up Thanks

## 2012-08-18 NOTE — Telephone Encounter (Signed)
Pt is aware of OV on 8/28 at 10 with AS per [facility]

## 2012-08-19 ENCOUNTER — Other Ambulatory Visit: Payer: Self-pay | Admitting: *Deleted

## 2012-08-19 MED ORDER — HYDROCODONE-ACETAMINOPHEN 7.5-325 MG PO TABS: 1.0000 | ORAL_TABLET | Freq: Four times a day (QID) | ORAL | Status: DC | PRN

## 2012-09-08 ENCOUNTER — Other Ambulatory Visit (HOSPITAL_COMMUNITY): Payer: Self-pay | Admitting: Family Medicine

## 2012-10-21 ENCOUNTER — Other Ambulatory Visit: Payer: Self-pay | Admitting: *Deleted

## 2012-10-21 MED ORDER — HYDROCODONE-ACETAMINOPHEN 7.5-325 MG PO TABS: 1.0000 | ORAL_TABLET | Freq: Four times a day (QID) | ORAL | Status: DC | PRN

## 2012-11-10 ENCOUNTER — Ambulatory Visit (INDEPENDENT_AMBULATORY_CARE_PROVIDER_SITE_OTHER): Payer: Medicare Other | Admitting: Nurse Practitioner

## 2012-11-10 ENCOUNTER — Encounter: Payer: Self-pay | Admitting: Nurse Practitioner

## 2012-11-10 VITALS — BP 130/84 | HR 70 | Ht 62.0 in | Wt 236.6 lb

## 2012-11-10 DIAGNOSIS — G629 Polyneuropathy, unspecified: Secondary | ICD-10-CM

## 2012-11-10 DIAGNOSIS — R0609 Other forms of dyspnea: Secondary | ICD-10-CM

## 2012-11-10 DIAGNOSIS — R609 Edema, unspecified: Secondary | ICD-10-CM

## 2012-11-10 DIAGNOSIS — G609 Hereditary and idiopathic neuropathy, unspecified: Secondary | ICD-10-CM

## 2012-11-10 DIAGNOSIS — R0989 Other specified symptoms and signs involving the circulatory and respiratory systems: Secondary | ICD-10-CM

## 2012-11-10 MED ORDER — HYDROCODONE-ACETAMINOPHEN 10-325 MG PO TABS: 1.0000 | ORAL_TABLET | Freq: Two times a day (BID) | ORAL | Status: DC

## 2012-11-11 ENCOUNTER — Encounter: Payer: Self-pay | Admitting: Nurse Practitioner

## 2012-11-11 DIAGNOSIS — R609 Edema, unspecified: Secondary | ICD-10-CM | POA: Insufficient documentation

## 2012-11-11 DIAGNOSIS — G629 Polyneuropathy, unspecified: Secondary | ICD-10-CM | POA: Insufficient documentation

## 2012-11-11 LAB — BRAIN NATRIURETIC PEPTIDE: Brain Natriuretic Peptide: 10.5 pg/mL (ref 0.0–100.0)

## 2012-11-11 NOTE — Progress Notes (Signed)
Subjective:  Patient presents with complaints of swelling in the arms and legs that began around March. Noticed that swelling is worse while on Lyrica. There has been helping some of her peripheral neuropathy, still having some breakthrough pain. No chest pain/ischemic type pain. No unusual shortness of breath. No orthopnea. No cough. No change in her salt intake. Patient has had a chronic intermittent problem with peripheral edema according to the chart. Patient states HCTZ helps her fluid better than Lasix.  Objective:   BP 130/84  Pulse 70  Ht 5\' 2"  (1.575 m)  Wt 236 lb 9.6 oz (107.321 kg)  BMI 43.26 kg/m2 NAD. Alert, oriented. Lungs clear. Heart regular rate rhythm. No JVD noted. Lower extremities trace to 1+ pitting edema. Strong pedal pulses. Significant central obesity.  Assessment:Peripheral edema  Other dyspnea and respiratory abnormality - Plan: Brain natriuretic peptide  Peripheral neuropathy  Plan: Meds ordered this encounter  Medications  . HYDROcodone-acetaminophen (NORCO) 10-325 MG per tablet    Sig: Take 1 tablet by mouth 2 (two) times daily.    Dispense:  60 tablet    Refill:  2    May fill after 11/20/12    Order Specific Question:  Supervising Provider    Answer:  Merlyn Albert [[id number]]   Will increase hydrocodone dose to 10 mg with the same number of tablets per month. Low-sodium diet. Encourage weight loss and regular activity. BNP pending. Warning signs reviewed. Recheck in 3 months, call back sooner if any problems. Recommend preventive health physical this fall.

## 2012-11-11 NOTE — Assessment & Plan Note (Signed)
Continue Lyrica as directed. Increased to Vicodin 10/325 one by mouth twice a day when necessary with the same number of tablets per month. Recheck in 3 months.

## 2012-11-11 NOTE — Assessment & Plan Note (Signed)
Patient chooses to continue HCTZ. Has noticed minimal improvement on Lasix in the past. BNP pending. Low-sodium diet. Recommend regular activity and weight loss.

## 2012-12-02 ENCOUNTER — Encounter: Payer: Self-pay | Admitting: Gastroenterology

## 2012-12-03 ENCOUNTER — Encounter: Payer: Self-pay | Admitting: Gastroenterology

## 2012-12-03 ENCOUNTER — Ambulatory Visit (INDEPENDENT_AMBULATORY_CARE_PROVIDER_SITE_OTHER): Payer: Medicare Other | Admitting: Gastroenterology

## 2012-12-03 VITALS — BP 114/72 | HR 66 | Temp 97.9°F | Ht 62.0 in | Wt 238.8 lb

## 2012-12-03 DIAGNOSIS — K5900 Constipation, unspecified: Secondary | ICD-10-CM

## 2012-12-03 DIAGNOSIS — R609 Edema, unspecified: Secondary | ICD-10-CM

## 2012-12-03 NOTE — Assessment & Plan Note (Signed)
Improved. Discussed bowel habits to include addition of fiber supplement daily, probiotic, low-fat diet. Weight loss efforts described. Next colonoscopy in 2024. Return prn.

## 2012-12-03 NOTE — Progress Notes (Signed)
Referring Provider: Babs Sciara, MD Primary Care Physician:  Lilyan Punt, MD Primary GI: Dr. Darrick Penna    Chief Complaint  Patient presents with  . Follow-up    HPI:   Deborah Carroll returns today with history of chronic constipation. TCS up-to-date, due again in 2024. Placed back on Lyrica. Denies any constipation. BM about every day.  Not well-formed stool. No Miralax. Sometimes goes three times a day. No diarrhea. Occasional abdominal discomfort; left [employer] one day and felt like she was going to have a baby. Followed by bowel movement. When feels the urge, has to go. No fiber supplements. No rectal bleeding.   Considerable weight gain since placed on Lyrica again. Lower extremity edema. Has seen PCP regarding this.    Past Medical History  Diagnosis Date  . Seizures     last one 1991  . Hypertension   . Depression   . Sleep apnea     CPAP  . Anxiety   . Chronic pain   . Peripheral neuropathy     Past Surgical History  Procedure Laterality Date  . Back surgery    . Cesarean section      x2  . Abdominal hysterectomy    . Carpal tunnel release      Bilateral  . Cholecystectomy    . Foot surgery  2004  . Colonoscopy  10/14/2002    [id number] involving descending colon, splenic flexure and distal transverse colon with most extensive inflammation in the splenic flexure area.  These changes are typical of ischemic colitis.  Biopsy taken.  . Colonoscopy N/A 08/04/2012    SLF: POLYPOID LESION IN AT THE APPENDICEAL ORIFICE/Polyp  in the descending colon/Moderate melanosis throughout the entire examined colon/ The colon IS redundant. small tubular adenoma.     Current Outpatient Prescriptions  Medication Sig Dispense Refill  . citalopram (CELEXA) 20 MG tablet TAKE ONE TABLET BY MOUTH EVERY DAY  90 tablet  1  . docusate sodium (COLACE) 100 MG capsule Take 100 mg by mouth 3 (three) times daily.      . hydrochlorothiazide (HYDRODIURIL) 25 MG tablet TAKE ONE TABLET BY  MOUTH EVERY DAY  90 tablet  1  . HYDROcodone-acetaminophen (NORCO) 10-325 MG per tablet Take 1 tablet by mouth 2 (two) times daily.  60 tablet  2  . phenytoin (DILANTIN) 100 MG ER capsule Take 200-300 mg by mouth 2 (two) times daily. 3  Tablets in the am 2 tablets in pm      . polyethylene glycol powder (MIRALAX) powder Take one capful twice a day for three days and then daily as needed for constipation.      . potassium chloride (K-DUR,KLOR-CON) 10 MEQ tablet Take 10 mEq by mouth daily.      . pregabalin (LYRICA) 50 MG capsule Take 50 mg by mouth daily.       No current facility-administered medications for this visit.    Allergies as of 12/03/2012  . (No Known Allergies)    Family History  Problem Relation Age of Onset  . Colon cancer Neg Hx   . Liver disease Neg Hx   . Colon polyps Mother   . Hypertension Maternal Grandmother   . Heart attack Maternal Grandfather     History   Social History  . Marital Status: Married    Spouse Name: N/A    Number of Children: 2  . Years of Education: N/A   Occupational History  . disability    Social History Main  Topics  . Smoking status: Never Smoker   . Smokeless tobacco: None  . Alcohol Use: Yes     Comment: occ  . Drug Use: No  . Sexual Activity: Yes    Birth Control/ Protection: Surgical   Other Topics Concern  . None   Social History Narrative  . None    Review of Systems: Negative unless mentioned in HPI.   Physical Exam: BP 114/72  Pulse 66  Temp(Src) 97.9 F (36.6 C) (Oral)  Ht 5\' 2"  (1.575 m)  Wt 238 lb 12.8 oz (108.319 kg)  BMI 43.67 kg/m2 General:   Alert and oriented. No distress noted. Pleasant and cooperative.  Head:  Normocephalic and atraumatic. Eyes:  Conjuctiva clear without scleral icterus. Heart:  S1, S2 present without murmurs, rubs, or gallops. Regular rate and rhythm. Abdomen:  +BS, soft, non-tender and non-distended. No rebound or guarding. No HSM or masses noted. Msk:  Symmetrical  without gross deformities. Normal posture. Extremities:  2+ edema bilaterally Neurologic:  Alert and  oriented x4;  grossly normal neurologically. Skin:  Intact without significant lesions or rashes. Psych:  Alert and cooperative. Normal mood and affect.

## 2012-12-03 NOTE — Progress Notes (Signed)
cc'd to pcp 

## 2012-12-03 NOTE — Patient Instructions (Addendum)
Start taking metamucil or benefiber daily. This is helpful in keeping your bowels regular.  Also, you may get a probiotic over the counter and take daily. Some examples are Digestive Advantage, Philip's Colon Health, [employer] brand, Align, Restora.  We will see you back as needed. Next colonoscopy in 10 years.  Please call Dr. Fletcher Anon office about your symptoms with Lyrica. For now, taper down to once per day until you hear back from them.

## 2012-12-03 NOTE — Assessment & Plan Note (Signed)
Likely secondary to Lyrica. PCP aware, seen earlier this month. Significant weight gain. Patient has tapered down to dosing twice per day. I have asked her to taper down to once per day, call PCP as soon as possible. Further management by PCP.

## 2012-12-04 ENCOUNTER — Encounter: Payer: Self-pay | Admitting: Family Medicine

## 2012-12-04 ENCOUNTER — Ambulatory Visit (INDEPENDENT_AMBULATORY_CARE_PROVIDER_SITE_OTHER): Payer: Medicare Other | Admitting: Family Medicine

## 2012-12-04 VITALS — BP 128/78 | Ht 62.0 in | Wt 237.6 lb

## 2012-12-04 DIAGNOSIS — R609 Edema, unspecified: Secondary | ICD-10-CM

## 2012-12-04 DIAGNOSIS — R600 Localized edema: Secondary | ICD-10-CM

## 2012-12-04 MED ORDER — TORSEMIDE 20 MG PO TABS: ORAL_TABLET | ORAL | Status: DC

## 2012-12-04 NOTE — Progress Notes (Signed)
  Subjective:    Patient ID: Deborah Carroll, female    DOB: 05/17/1955, 57 y.o.   MRN: [id number]  HPI Patient here today because she has gained 18 lbs ever since she started Lyrica. She believes she is retaining fluid and is swollen from head to toe. Slight pitting edema in lower extremities.  She denies shortness of breath wheezing difficulty breathing. Lower appetite.    No other concerns.     Review of Systems She denies shortness of breath chest pain or discomfort, denies leg pain    Objective:   Physical Exam Lungs are clear there are no crackles no fluid heart regular no gallop pulse normal blood pressure good abdomen soft obese pitting edema up toward her knees bilateral no edema noted in the arms.       Assessment & Plan:  Significant edema-I believe this patient would benefit from Demadex 20 mg one or 2 each morning as directed continue potassium check metabolic 7 and 2 weeks followup here in 4 weeks. I believe this edema is a side effect of the Lyrica but Elavil as not a good choice and she does need something for her neuropathy.

## 2013-01-05 ENCOUNTER — Ambulatory Visit: Payer: Medicare Other | Admitting: Family Medicine

## 2013-01-06 ENCOUNTER — Telehealth: Payer: Self-pay | Admitting: Family Medicine

## 2013-01-06 ENCOUNTER — Encounter: Payer: Self-pay | Admitting: Family Medicine

## 2013-01-06 MED ORDER — PREGABALIN 50 MG PO CAPS: 50.0000 mg | ORAL_CAPSULE | Freq: Every day | ORAL | Status: DC

## 2013-01-06 NOTE — Telephone Encounter (Signed)
Rx faxed to [facility]. Patient notified. 

## 2013-01-06 NOTE — Telephone Encounter (Signed)
pregabalin (LYRICA) 50 MG capsule  Pt needs this refill sent to [facility], not [facility] (due to them closing needs to transfer scripts)

## 2013-01-25 NOTE — Progress Notes (Signed)
REVIEWED.  TCS APR 2014 SIMPLE ADENOMA

## 2013-02-22 ENCOUNTER — Encounter: Payer: Self-pay | Admitting: Family Medicine

## 2013-02-22 ENCOUNTER — Ambulatory Visit (INDEPENDENT_AMBULATORY_CARE_PROVIDER_SITE_OTHER): Payer: Medicare Other | Admitting: Family Medicine

## 2013-02-22 VITALS — BP 128/84 | Ht 62.0 in | Wt 236.2 lb

## 2013-02-22 DIAGNOSIS — Z79899 Other long term (current) drug therapy: Secondary | ICD-10-CM

## 2013-02-22 DIAGNOSIS — G629 Polyneuropathy, unspecified: Secondary | ICD-10-CM

## 2013-02-22 DIAGNOSIS — Z23 Encounter for immunization: Secondary | ICD-10-CM

## 2013-02-22 DIAGNOSIS — G609 Hereditary and idiopathic neuropathy, unspecified: Secondary | ICD-10-CM

## 2013-02-22 DIAGNOSIS — Z0000 Encounter for general adult medical examination without abnormal findings: Secondary | ICD-10-CM

## 2013-02-22 DIAGNOSIS — R609 Edema, unspecified: Secondary | ICD-10-CM

## 2013-02-22 DIAGNOSIS — G40909 Epilepsy, unspecified, not intractable, without status epilepticus: Secondary | ICD-10-CM | POA: Insufficient documentation

## 2013-02-22 DIAGNOSIS — R569 Unspecified convulsions: Secondary | ICD-10-CM

## 2013-02-22 MED ORDER — POTASSIUM CHLORIDE CRYS ER 10 MEQ PO TBCR: 10.0000 meq | EXTENDED_RELEASE_TABLET | Freq: Two times a day (BID) | ORAL | Status: DC

## 2013-02-22 MED ORDER — PHENYTOIN SODIUM EXTENDED 100 MG PO CAPS: ORAL_CAPSULE | ORAL | Status: DC

## 2013-02-22 MED ORDER — HYDROCODONE-ACETAMINOPHEN 10-325 MG PO TABS: 1.0000 | ORAL_TABLET | Freq: Two times a day (BID) | ORAL | Status: DC

## 2013-02-22 MED ORDER — TORSEMIDE 20 MG PO TABS: ORAL_TABLET | ORAL | Status: DC

## 2013-02-22 MED ORDER — CITALOPRAM HYDROBROMIDE 20 MG PO TABS: ORAL_TABLET | ORAL | Status: DC

## 2013-02-22 MED ORDER — PREGABALIN 50 MG PO CAPS: 50.0000 mg | ORAL_CAPSULE | Freq: Two times a day (BID) | ORAL | Status: DC

## 2013-02-22 NOTE — Progress Notes (Signed)
  Subjective:    Patient ID: Deborah Carroll, female    DOB: 1955/08/07, 57 y.o.   MRN: [id number]  HPI Patient is here today to get a refill on her medications.  No other concerns.  Patient relates she needs refills on her medicine long discussion held 25 minutes with the patient reviewing over her pedal edema/peripheral neuropathy/seizure disorder/chronic intermittent lower pain from the peripheral neuropathy/constipation. Patient is up-to-date on colonoscopies she denies any chest tightness pressure pain shortness of breath or rectal bleeding.   Review of Systems See above    Objective:   Physical Exam Pedal edema present but not severe skin dry Lungs are clear hearts regular Blood pressure good on recheck       Assessment & Plan:  #1 seizure disorder stable-check Dilantin level #2 peripheral edema -- Demadex as indicated 1 or 2 tablets in the morning when necessary take potassium once daily but may have to go to twice daily depending on how lab work shows #3 history hyperlipidemia check lab work #4 chronic pain uses hydrocodone sparingly prescription for 30 given cautioned drowsiness #5 peripheral neuropathy Lyrica as directed

## 2013-06-17 ENCOUNTER — Ambulatory Visit: Payer: Medicare Other | Admitting: Family Medicine

## 2013-06-29 NOTE — Progress Notes (Signed)
REVIEWED.  

## 2013-06-30 ENCOUNTER — Telehealth: Payer: Self-pay | Admitting: Family Medicine

## 2013-06-30 MED ORDER — SCOPOLAMINE 1 MG/3DAYS TD PT72: MEDICATED_PATCH | TRANSDERMAL | Status: DC

## 2013-06-30 NOTE — Telephone Encounter (Signed)
Pt's husband called, they are leaving Saturday 07/03/13 for a 5 day cruise, would like motion sickness patches to take with her just in case, please send to [facility]

## 2013-06-30 NOTE — Telephone Encounter (Signed)
Scopolamine patches 1 box to apply every 3 days best to apply the day before the cruise and take off the day after the cruise. May trigger some dizziness when patches removed. If any problems let us know.

## 2013-06-30 NOTE — Telephone Encounter (Signed)
Discussed with pt. Med sent to [facility].

## 2013-07-20 ENCOUNTER — Other Ambulatory Visit: Payer: Self-pay | Admitting: Family Medicine

## 2013-07-20 NOTE — Telephone Encounter (Signed)
Seen 11/17

## 2013-07-20 NOTE — Telephone Encounter (Signed)
May refill this +6 refills

## 2013-08-19 ENCOUNTER — Encounter: Payer: Self-pay | Admitting: Family Medicine

## 2013-08-19 ENCOUNTER — Ambulatory Visit (INDEPENDENT_AMBULATORY_CARE_PROVIDER_SITE_OTHER): Payer: Commercial Managed Care - HMO | Admitting: Family Medicine

## 2013-08-19 VITALS — BP 132/78 | Ht 62.0 in | Wt 238.4 lb

## 2013-08-19 DIAGNOSIS — R5383 Other fatigue: Secondary | ICD-10-CM

## 2013-08-19 DIAGNOSIS — R5381 Other malaise: Secondary | ICD-10-CM

## 2013-08-19 DIAGNOSIS — G8929 Other chronic pain: Secondary | ICD-10-CM

## 2013-08-19 DIAGNOSIS — Z0189 Encounter for other specified special examinations: Secondary | ICD-10-CM

## 2013-08-19 DIAGNOSIS — G629 Polyneuropathy, unspecified: Secondary | ICD-10-CM

## 2013-08-19 DIAGNOSIS — G4733 Obstructive sleep apnea (adult) (pediatric): Secondary | ICD-10-CM

## 2013-08-19 DIAGNOSIS — M549 Dorsalgia, unspecified: Secondary | ICD-10-CM

## 2013-08-19 DIAGNOSIS — G609 Hereditary and idiopathic neuropathy, unspecified: Secondary | ICD-10-CM

## 2013-08-19 DIAGNOSIS — R569 Unspecified convulsions: Secondary | ICD-10-CM

## 2013-08-19 DIAGNOSIS — R609 Edema, unspecified: Secondary | ICD-10-CM

## 2013-08-19 MED ORDER — HYDROCODONE-ACETAMINOPHEN 10-325 MG PO TABS: 1.0000 | ORAL_TABLET | Freq: Three times a day (TID) | ORAL | Status: DC | PRN

## 2013-08-19 NOTE — Progress Notes (Signed)
Subjective:    Patient ID: Deborah Carroll, female    DOB: 03/15/1956, 58 y.o.   MRN: [id number]  HPI Patient is here today for her chronic pain follow up visit. Patient wants to discuss the difference between the CPAP and IPAP machine. She is still snoring at night. Wants to discuss the weight gain she is experiencing from taking the Lyrica. Wants to know if Gabapentin would be a better alternative.   The patient has been trying to watch her diet she's been tried exercise she's been gaining weight. She is frustrated she wonders if the medication is contributing. She denies any other particular troubles.  She is having problems with her CPAP machine we talked about this at length she will talk with her insurance company to see if they would recommend respiratory therapy to work with her to find a proper machine if not she will notify us and we will set her up with pulmonology specialist  She has history of seizures none recently needs lab work done. She did not do it from last visit. Review of Systems  Constitutional: Negative for activity change, appetite change and fatigue.  Respiratory: Negative for cough and shortness of breath.   Cardiovascular: Negative for chest pain.  Gastrointestinal: Negative for abdominal pain and abdominal distention.  Endocrine: Negative for polydipsia and polyphagia.  Genitourinary: Negative for frequency.  Musculoskeletal: Positive for back pain.  Neurological: Negative for dizziness, weakness and headaches.  Psychiatric/Behavioral: Negative for confusion.       Objective:   Physical Exam  Vitals reviewed. Constitutional: She appears well-nourished. No distress.  Cardiovascular: Normal rate, regular rhythm and normal heart sounds.   No murmur heard. Pulmonary/Chest: Effort normal and breath sounds normal. No respiratory distress.  Musculoskeletal: She exhibits no edema.  Patient does have pedal edema she also has mild neuropathy worse on the left side  than the right side to monofilament. Has positional sense. Pulses are good. Reflexes good.  Lymphadenopathy:    She has no cervical adenopathy.  Neurological: She is alert. She exhibits normal muscle tone.  Psychiatric: Her behavior is normal.          Assessment & Plan:  1. Peripheral neuropathy Patient has peripheral neuropathy she was seen several years ago for this by neurology group nerve conduction test were normal but she was told that small nerve endings could be abnormal and still not show up on the tests so therefore she did try gabapentin she did have side effects with that then she was tried on Lyrica currently she is having weight gain as well as swelling in the legs she feels the symptoms are getting worse I told her it would be worthwhile to check a B12 to get a fresh opinion regarding her neuropathy possibly another nerve conduction study - Vitamin B12 - Ambulatory referral to Neurology  2. Seizures No seizures recently check Dilantin level - Dilantin (Phenytoin) level, total  3. Peripheral edema Peripheral edema she is on a diuretic it is more than likely this is related to her medication Lyrica - Hepatic function panel - Basic metabolic panel  4. Other specified examination Patient is due a cholesterol profile - Lipid panel  5. Other malaise and fatigue She has significant fatigue and tiredness probably related to her sleep apnea but we will check a CBC - CBC with Differential  6. Chronic back pain She does have chronic back pain hydrocodone 3-4 times per day 3 scripts given followup 3 months patient denies abusing  it  7. Obstructive sleep apnea She will talk with her and sure about if there is an alternative supplier for CPAP machine and that if this is not something that can be done then next step would be she would connect with [place] and we would refer her to a sleep specialist

## 2013-08-20 LAB — CBC WITH DIFFERENTIAL/PLATELET
Basophils Absolute: 0 10*3/uL (ref 0.0–0.1)
Basophils Relative: 0 % (ref 0–1)
Eosinophils Absolute: 0.3 10*3/uL (ref 0.0–0.7)
Eosinophils Relative: 3 % (ref 0–5)
HCT: 40.1 % (ref 36.0–46.0)
Hemoglobin: 13.8 g/dL (ref 12.0–15.0)
Lymphocytes Relative: 27 % (ref 12–46)
Lymphs Abs: 2.5 10*3/uL (ref 0.7–4.0)
MCH: 29.2 pg (ref 26.0–34.0)
MCHC: 34.4 g/dL (ref 30.0–36.0)
MCV: 85 fL (ref 78.0–100.0)
Monocytes Absolute: 0.6 10*3/uL (ref 0.1–1.0)
Monocytes Relative: 7 % (ref 3–12)
Neutro Abs: 5.7 10*3/uL (ref 1.7–7.7)
Neutrophils Relative %: 63 % (ref 43–77)
Platelets: 168 10*3/uL (ref 150–400)
RBC: 4.72 MIL/uL (ref 3.87–5.11)
RDW: 14.2 % (ref 11.5–15.5)
WBC: 9.1 10*3/uL (ref 4.0–10.5)

## 2013-08-21 LAB — HEPATIC FUNCTION PANEL
ALT: 13 U/L (ref 0–35)
AST: 14 U/L (ref 0–37)
Albumin: 3.7 g/dL (ref 3.5–5.2)
Alkaline Phosphatase: 127 U/L — ABNORMAL HIGH (ref 39–117)
Bilirubin, Direct: 0.1 mg/dL (ref 0.0–0.3)
Indirect Bilirubin: 0.4 mg/dL (ref 0.2–1.2)
Total Bilirubin: 0.5 mg/dL (ref 0.2–1.2)
Total Protein: 6.7 g/dL (ref 6.0–8.3)

## 2013-08-21 LAB — LIPID PANEL
Cholesterol: 166 mg/dL (ref 0–200)
HDL: 48 mg/dL (ref >39)
LDL Cholesterol: 92 mg/dL (ref 0–99)
Total CHOL/HDL Ratio: 3.5 Ratio
Triglycerides: 128 mg/dL (ref <150)
VLDL: 26 mg/dL (ref 0–40)

## 2013-08-21 LAB — BASIC METABOLIC PANEL
BUN: 9 mg/dL (ref 6–23)
CO2: 29 mEq/L (ref 19–32)
Calcium: 9 mg/dL (ref 8.4–10.5)
Chloride: 104 mEq/L (ref 96–112)
Creat: 0.64 mg/dL (ref 0.50–1.10)
Glucose, Bld: 105 mg/dL — ABNORMAL HIGH (ref 70–99)
Potassium: 3.7 mEq/L (ref 3.5–5.3)
Sodium: 142 mEq/L (ref 135–145)

## 2013-08-21 LAB — VITAMIN B12: Vitamin B-12: 517 pg/mL (ref 211–911)

## 2013-08-21 LAB — PHENYTOIN LEVEL, TOTAL: Phenytoin Lvl: 6.6 ug/mL — ABNORMAL LOW (ref 10.0–20.0)

## 2013-09-19 ENCOUNTER — Other Ambulatory Visit: Payer: Self-pay | Admitting: Family Medicine

## 2013-10-13 ENCOUNTER — Ambulatory Visit: Payer: Medicare Other | Admitting: Neurology

## 2013-11-14 ENCOUNTER — Other Ambulatory Visit: Payer: Self-pay | Admitting: Family Medicine

## 2013-11-18 ENCOUNTER — Ambulatory Visit (INDEPENDENT_AMBULATORY_CARE_PROVIDER_SITE_OTHER): Payer: Commercial Managed Care - HMO | Admitting: Family Medicine

## 2013-11-18 ENCOUNTER — Other Ambulatory Visit: Payer: Self-pay | Admitting: Family Medicine

## 2013-11-18 ENCOUNTER — Encounter: Payer: Self-pay | Admitting: Family Medicine

## 2013-11-18 VITALS — BP 132/72 | Ht 62.0 in | Wt 240.0 lb

## 2013-11-18 DIAGNOSIS — R609 Edema, unspecified: Secondary | ICD-10-CM

## 2013-11-18 DIAGNOSIS — G8929 Other chronic pain: Secondary | ICD-10-CM

## 2013-11-18 DIAGNOSIS — Q828 Other specified congenital malformations of skin: Secondary | ICD-10-CM

## 2013-11-18 DIAGNOSIS — G629 Polyneuropathy, unspecified: Secondary | ICD-10-CM

## 2013-11-18 DIAGNOSIS — G609 Hereditary and idiopathic neuropathy, unspecified: Secondary | ICD-10-CM

## 2013-11-18 DIAGNOSIS — M549 Dorsalgia, unspecified: Secondary | ICD-10-CM

## 2013-11-18 DIAGNOSIS — R739 Hyperglycemia, unspecified: Secondary | ICD-10-CM

## 2013-11-18 DIAGNOSIS — R7309 Other abnormal glucose: Secondary | ICD-10-CM

## 2013-11-18 LAB — POCT GLYCOSYLATED HEMOGLOBIN (HGB A1C): Hemoglobin A1C: 5.8

## 2013-11-18 MED ORDER — HYDROCODONE-ACETAMINOPHEN 10-325 MG PO TABS: 1.0000 | ORAL_TABLET | Freq: Three times a day (TID) | ORAL | Status: DC | PRN

## 2013-11-18 MED ORDER — PHENYTOIN SODIUM EXTENDED 100 MG PO CAPS: ORAL_CAPSULE | ORAL | Status: DC

## 2013-11-18 MED ORDER — TORSEMIDE 20 MG PO TABS
ORAL_TABLET | ORAL | Status: DC
Start: 2013-11-18 — End: 2016-11-27

## 2013-11-18 MED ORDER — POTASSIUM CHLORIDE CRYS ER 10 MEQ PO TBCR: 10.0000 meq | EXTENDED_RELEASE_TABLET | Freq: Two times a day (BID) | ORAL | Status: DC

## 2013-11-18 NOTE — Progress Notes (Signed)
   Subjective:    Patient ID: Deborah Carroll, female    DOB: 04-13-1955, 58 y.o.   MRN: [id number]  HPI Patient arrives for a follow up on swelling in feet and legs. This is been a problem that's been going on for years but could be made worse by her Lyrica which is used for her neuropathy. She denies any shortness of breath or chest pain This patient was seen today for chronic pain  The medication list was reviewed and updated.   -Compliance with pain medication: none  The patient was advised the importance of maintaining medication and not using illegal substances with these.  Refills needed: yes  The patient was educated that we can provide 3 monthly scripts for their medication, it is their responsibility to follow the instructions.  Side effects or complications from medications: none,BM ok  Patient is aware that pain medications are meant to minimize the severity of the pain to allow their pain levels to improve to allow for better function. They are aware of that pain medications cannot totally remove their pain.  Due for UDT ( at least once per year) : this fall       Review of Systems  Constitutional: Negative for activity change, appetite change and fatigue.  Respiratory: Negative for shortness of breath.   Cardiovascular: Positive for leg swelling. Negative for chest pain.  Gastrointestinal: Negative for abdominal pain.  Neurological: Negative for headaches.  Psychiatric/Behavioral: Negative for behavioral problems.       Objective:   Physical Exam  Vitals reviewed. Constitutional: She appears well-nourished. No distress.  HENT:  Head: Normocephalic.  Cardiovascular: Normal rate, regular rhythm and normal heart sounds.   No murmur heard. Pulmonary/Chest: Effort normal and breath sounds normal.  Musculoskeletal: She exhibits no edema.  Lymphadenopathy:    She has no cervical adenopathy.  Neurological: She is alert.  Psychiatric: Her behavior is normal.           Assessment & Plan:  Chronic pedal edema-there is no sign of congestive heart failure. Part of her swelling is related to Lyrica. She takes diuretic to help keep it under control.  Obesity patient was counseled regarding trying to lose weight encouraged to watch portions stay physically active  Chronic pain neuropathy in the feet  she was given her prescriptions and instructed to use as directed all what before the end of her medications   History hyperglycemia A1c looks good watch starches in diet stay physically at

## 2013-12-16 ENCOUNTER — Other Ambulatory Visit: Payer: Self-pay

## 2014-02-15 ENCOUNTER — Encounter: Payer: Self-pay | Admitting: Family Medicine

## 2014-02-15 ENCOUNTER — Telehealth: Payer: Self-pay | Admitting: Family Medicine

## 2014-02-15 ENCOUNTER — Ambulatory Visit (INDEPENDENT_AMBULATORY_CARE_PROVIDER_SITE_OTHER): Payer: Commercial Managed Care - HMO | Admitting: Family Medicine

## 2014-02-15 VITALS — BP 136/86 | Ht 62.0 in | Wt 238.4 lb

## 2014-02-15 DIAGNOSIS — M549 Dorsalgia, unspecified: Secondary | ICD-10-CM

## 2014-02-15 DIAGNOSIS — Z23 Encounter for immunization: Secondary | ICD-10-CM

## 2014-02-15 DIAGNOSIS — G8929 Other chronic pain: Secondary | ICD-10-CM

## 2014-02-15 DIAGNOSIS — G629 Polyneuropathy, unspecified: Secondary | ICD-10-CM

## 2014-02-15 MED ORDER — HYDROCODONE-ACETAMINOPHEN 10-325 MG PO TABS: 1.0000 | ORAL_TABLET | Freq: Three times a day (TID) | ORAL | Status: DC | PRN

## 2014-02-15 MED ORDER — CITALOPRAM HYDROBROMIDE 20 MG PO TABS: ORAL_TABLET | ORAL | Status: DC

## 2014-02-15 MED ORDER — PREGABALIN 50 MG PO CAPS: ORAL_CAPSULE | ORAL | Status: DC

## 2014-02-15 NOTE — Telephone Encounter (Signed)
error 

## 2014-02-15 NOTE — Patient Instructions (Signed)

## 2014-02-15 NOTE — Progress Notes (Signed)
   Subjective:    Patient ID: Deborah Carroll, female    DOB: 1956-01-11, 58 y.o.   MRN: [id number]  HPI This patient was seen today for chronic pain  The medication list was reviewed and updated.   -Compliance with pain medication: yes  The patient was advised the importance of maintaining medication and not using illegal substances with these.  Refills needed: yes  The patient was educated that we can provide 3 monthly scripts for their medication, it is their responsibility to follow the instructions.  Side effects or complications from medications:none  Patient is aware that pain medications are meant to minimize the severity of the pain to allow their pain levels to improve to allow for better function. They are aware of that pain medications cannot totally remove their pain.  Due for UDT ( at least once per year) : next visit  Patient states that she has no concerns at this time.     Patient comes in to review of her medications plus also get her prescriptions   Review of Systems Denies chest tightness pressure pain shortness breath relates some burning and numbness in the feet    Objective:   Physical Exam  Lungs are clear hearts regular extremities no edema skin warm dry neurologic grossly normal      Assessment & Plan:  neuropathy-her neuropathies given a [place] trouble increase Lyrica 50 mg 3 times a day she will give this a next several weeks if that does [place] stick with that if not we'll need to bump up the dose  Chronic pain she does well with the medicine 3 prescriptions were given she needs follow-up in 3 months  A1c needs to be rechecked again on a follow-up visit. Comprehensive lab work on the next visit

## 2014-04-14 ENCOUNTER — Other Ambulatory Visit: Payer: Self-pay

## 2014-04-14 DIAGNOSIS — Z1231 Encounter for screening mammogram for malignant neoplasm of breast: Secondary | ICD-10-CM

## 2014-04-26 ENCOUNTER — Ambulatory Visit
Admission: RE | Admit: 2014-04-26 | Discharge: 2014-04-26 | Disposition: A | Discharge status: Home / Self Care | Payer: PPO | Source: Ambulatory Visit

## 2014-04-26 DIAGNOSIS — Z1231 Encounter for screening mammogram for malignant neoplasm of breast: Secondary | ICD-10-CM

## 2014-05-16 ENCOUNTER — Ambulatory Visit (INDEPENDENT_AMBULATORY_CARE_PROVIDER_SITE_OTHER): Payer: PPO | Admitting: Family Medicine

## 2014-05-16 ENCOUNTER — Encounter: Payer: Self-pay | Admitting: Family Medicine

## 2014-05-16 ENCOUNTER — Telehealth: Payer: Self-pay | Admitting: Family Medicine

## 2014-05-16 VITALS — BP 128/80 | Ht 62.0 in | Wt 242.0 lb

## 2014-05-16 DIAGNOSIS — Z131 Encounter for screening for diabetes mellitus: Secondary | ICD-10-CM

## 2014-05-16 DIAGNOSIS — Z79899 Other long term (current) drug therapy: Secondary | ICD-10-CM

## 2014-05-16 DIAGNOSIS — R609 Edema, unspecified: Secondary | ICD-10-CM

## 2014-05-16 DIAGNOSIS — R569 Unspecified convulsions: Secondary | ICD-10-CM

## 2014-05-16 DIAGNOSIS — G8929 Other chronic pain: Secondary | ICD-10-CM

## 2014-05-16 DIAGNOSIS — E785 Hyperlipidemia, unspecified: Secondary | ICD-10-CM

## 2014-05-16 DIAGNOSIS — M549 Dorsalgia, unspecified: Secondary | ICD-10-CM

## 2014-05-16 DIAGNOSIS — G629 Polyneuropathy, unspecified: Secondary | ICD-10-CM

## 2014-05-16 DIAGNOSIS — R102 Pelvic and perineal pain: Secondary | ICD-10-CM

## 2014-05-16 DIAGNOSIS — G4733 Obstructive sleep apnea (adult) (pediatric): Secondary | ICD-10-CM

## 2014-05-16 DIAGNOSIS — J208 Acute bronchitis due to other specified organisms: Secondary | ICD-10-CM

## 2014-05-16 MED ORDER — HYDROCODONE-ACETAMINOPHEN 10-325 MG PO TABS: 1.0000 | ORAL_TABLET | Freq: Three times a day (TID) | ORAL | Status: DC | PRN

## 2014-05-16 MED ORDER — PANTOPRAZOLE SODIUM 40 MG PO TBEC: 40.0000 mg | DELAYED_RELEASE_TABLET | Freq: Every day | ORAL | Status: DC

## 2014-05-16 MED ORDER — CEFPROZIL 500 MG PO TABS: 500.0000 mg | ORAL_TABLET | Freq: Two times a day (BID) | ORAL | Status: DC

## 2014-05-16 MED ORDER — PREGABALIN 100 MG PO CAPS: 100.0000 mg | ORAL_CAPSULE | Freq: Two times a day (BID) | ORAL | Status: DC

## 2014-05-16 NOTE — Telephone Encounter (Signed)
Patient will go ahead and get Ultrasound done at [facility] on Wed.

## 2014-05-16 NOTE — Patient Instructions (Addendum)
As part of your visit today we have covered your chronic pain. You have been given prescription(s) for pain medicines.The DEA and [place] require that any patient on pain medications must be seen every 3 months. You are expected to come in for a office visit before further pain medications are issued.   We will not refill medications or early nor will we give an extended month supply at the end of these prescriptions.It is your responsibility to keep up with medications. They will not be replaced.  It is your responsibility to schedule an office visit in 3-4 months to be seen before you are out of your medication. Do not call our office to request early refills or additional refills. Do not wait till the last moment to schedule the follow up visit. We highly recommend you schedule this now for 3 months.  We believe that most patients take their meds as prescribed but drug misuse and diversion is a serious problem in the [place]. Our office does standard measures to insure proper care to all. All patients are subject to random urine drug screens and random pill counts. Also all patients drug prescription records are reviewed on a regular basis in accordance with [facility] medical board policies.  Remember, do not use alcohol or illegal drugs with your pain medications.    We are required by law to adhere to strict regulations. Failure on our part to follow these regulations could jeopardize our prescription license which in turn would cause [place] not to be able to care for you.Thank you for your understanding and following these policies.Plantar Fasciitis Plantar fasciitis is a common condition that causes foot pain. It is soreness (inflammation) of the band of tough fibrous tissue on the bottom of the foot that runs from the heel bone (calcaneus) to the ball of the foot. The cause of this soreness may be from excessive standing, poor fitting shoes, running on hard surfaces, being overweight, having an  abnormal walk, or overuse (this is common in runners) of the painful foot or feet. It is also common in aerobic exercise dancers and ballet dancers. SYMPTOMS  Most people with plantar fasciitis complain of:  Severe pain in the morning on the bottom of their foot especially when taking the first steps out of bed. This pain recedes after a few minutes of walking.  Severe pain is experienced also during walking following a long period of inactivity.  Pain is worse when walking barefoot or up stairs DIAGNOSIS   Your caregiver will diagnose this condition by examining and feeling your foot.  Special tests such as X-rays of your foot, are usually not needed. PREVENTION   Consult a sports medicine professional before beginning a new exercise program.  Walking programs offer a good workout. With walking there is a lower chance of overuse injuries common to runners. There is less impact and less jarring of the joints.  Begin all new exercise programs slowly. If problems or pain develop, decrease the amount of time or distance until you are at a comfortable level.  Wear good shoes and replace them regularly.  Stretch your foot and the heel cords at the back of the ankle (Achilles tendon) both before and after exercise.  Run or exercise on even surfaces that are not hard. For example, asphalt is better than pavement.  Do not run barefoot on hard surfaces.  If using a treadmill, vary the incline.  Do not continue to workout if you have foot or joint  problems. Seek professional help if they do not improve. HOME CARE INSTRUCTIONS   Avoid activities that cause you pain until you recover.  Use ice or cold packs on the problem or painful areas after working out.  Only take over-the-counter or prescription medicines for pain, discomfort, or fever as directed by your caregiver.  Soft shoe inserts or athletic shoes with air or gel sole cushions may be helpful.  If problems continue or become  more severe, consult a sports medicine caregiver or your own health care provider. Cortisone is a potent anti-inflammatory medication that may be injected into the painful area. You can discuss this treatment with your caregiver. MAKE SURE YOU:   Understand these instructions.  Will watch your condition.  Will get help right away if you are not doing well or get worse. Document Released: 12/18/2000 Document Revised: 06/17/2011 Document Reviewed: 02/17/2008 [facility] Patient Information 2015 [place], [place]. This information is not intended to replace advice given to you by your health care provider. Make sure you discuss any questions you have with your health care provider.

## 2014-05-16 NOTE — Telephone Encounter (Signed)
Patient was seen earlier today and we scheduled her for an U/S at [facility] on Wednesday.  She wants to know if we can change this to [facility] instead?

## 2014-05-16 NOTE — Progress Notes (Signed)
   Subjective:    Patient ID: Deborah Carroll, female    DOB: 1955/10/10, 59 y.o.   MRN: [id number]  HPI This patient was seen today for chronic pain  The medication list was reviewed and updated.   -Compliance with pain medication: Yes  The patient was advised the importance of maintaining medication and not using illegal substances with these.  Refills needed: Yes  The patient was educated that we can provide 3 monthly scripts for their medication, it is their responsibility to follow the instructions.  Side effects or complications from medications:  No  Patient is aware that pain medications are meant to minimize the severity of the pain to allow their pain levels to improve to allow for better function. They are aware of that pain medications cannot totally remove their pain.  Due for UDT ( at least once per year) : next visit  Burning always present Tingling up the legs Trying to lose weight Using eliptical  Upper resp- started at New Years, chest congestion,no fever Denies wheezing shortness of breath.  Stomach -2 to 3 times a week. Present for past 6 months.Some nausea. 2 to 3 hours. Knows no triggers, nl BM, no blood, no diarrhea  Intermittent pelvic pain. Comes and goes over the past several months has had hysterectomy but still has ovaries feels bloated at times  Patient also with heel pain mentioned at the end of the visit information given about plantar fasciitis        Review of Systems Denies excessive thirst urination denies chest tightness pressure pain relates burning tingling numbness in the legs relates intermittent abdominal discomfort relates lower pelvic intermittent pain denies rectal bleeding denies hematuria    Objective:   Physical Exam Lungs clear heart regular abdomen soft obese minimal midabdominal tenderness and minimal lower pelvic tenderness extremities no edema 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

## 2014-05-18 ENCOUNTER — Ambulatory Visit (HOSPITAL_COMMUNITY)
Admission: RE | Admit: 2014-05-18 | Discharge: 2014-05-18 | Disposition: A | Discharge status: Home / Self Care | Payer: PPO | Source: Ambulatory Visit | Attending: Family Medicine | Admitting: Family Medicine

## 2014-05-18 ENCOUNTER — Encounter (HOSPITAL_COMMUNITY): Payer: Self-pay

## 2014-05-18 ENCOUNTER — Ambulatory Visit (HOSPITAL_COMMUNITY): Payer: PPO

## 2014-05-18 DIAGNOSIS — R102 Pelvic and perineal pain: Secondary | ICD-10-CM | POA: Insufficient documentation

## 2014-05-18 DIAGNOSIS — Z9049 Acquired absence of other specified parts of digestive tract: Secondary | ICD-10-CM | POA: Diagnosis not present

## 2014-05-18 DIAGNOSIS — Z90710 Acquired absence of both cervix and uterus: Secondary | ICD-10-CM | POA: Diagnosis not present

## 2014-05-18 DIAGNOSIS — K559 Vascular disorder of intestine, unspecified: Secondary | ICD-10-CM | POA: Insufficient documentation

## 2014-05-19 ENCOUNTER — Other Ambulatory Visit: Payer: Self-pay | Admitting: *Deleted

## 2014-05-19 DIAGNOSIS — M25559 Pain in unspecified hip: Secondary | ICD-10-CM

## 2014-05-19 DIAGNOSIS — R109 Unspecified abdominal pain: Secondary | ICD-10-CM

## 2014-05-19 LAB — URINALYSIS, ROUTINE W REFLEX MICROSCOPIC
Bilirubin Urine: NEGATIVE
Glucose, UA: NEGATIVE mg/dL
Hgb urine dipstick: NEGATIVE
Leukocytes, UA: NEGATIVE
Nitrite: NEGATIVE
Protein, ur: NEGATIVE mg/dL
Specific Gravity, Urine: 1.028 (ref 1.005–1.030)
Urobilinogen, UA: 0.2 mg/dL (ref 0.0–1.0)
pH: 6 (ref 5.0–8.0)

## 2014-05-19 LAB — CBC WITH DIFFERENTIAL/PLATELET
Basophils Absolute: 0 10*3/uL (ref 0.0–0.1)
Basophils Relative: 0 % (ref 0–1)
Eosinophils Absolute: 0.2 10*3/uL (ref 0.0–0.7)
Eosinophils Relative: 2 % (ref 0–5)
HCT: 40.4 % (ref 36.0–46.0)
Hemoglobin: 13.5 g/dL (ref 12.0–15.0)
Lymphocytes Relative: 25 % (ref 12–46)
Lymphs Abs: 2.6 10*3/uL (ref 0.7–4.0)
MCH: 28.4 pg (ref 26.0–34.0)
MCHC: 33.4 g/dL (ref 30.0–36.0)
MCV: 85.1 fL (ref 78.0–100.0)
MPV: 9.2 fL (ref 8.6–12.4)
Monocytes Absolute: 0.5 10*3/uL (ref 0.1–1.0)
Monocytes Relative: 5 % (ref 3–12)
Neutro Abs: 7 10*3/uL (ref 1.7–7.7)
Neutrophils Relative %: 68 % (ref 43–77)
Platelets: 192 10*3/uL (ref 150–400)
RBC: 4.75 MIL/uL (ref 3.87–5.11)
RDW: 14.4 % (ref 11.5–15.5)
WBC: 10.3 10*3/uL (ref 4.0–10.5)

## 2014-05-19 LAB — LIPID PANEL
Cholesterol: 176 mg/dL (ref 0–200)
HDL: 44 mg/dL (ref >39)
LDL Cholesterol: 99 mg/dL (ref 0–99)
Total CHOL/HDL Ratio: 4 Ratio
Triglycerides: 166 mg/dL — ABNORMAL HIGH (ref <150)
VLDL: 33 mg/dL (ref 0–40)

## 2014-05-19 LAB — BASIC METABOLIC PANEL
BUN: 10 mg/dL (ref 6–23)
CO2: 27 mEq/L (ref 19–32)
Calcium: 8.8 mg/dL (ref 8.4–10.5)
Chloride: 102 mEq/L (ref 96–112)
Creat: 0.66 mg/dL (ref 0.50–1.10)
Glucose, Bld: 110 mg/dL — ABNORMAL HIGH (ref 70–99)
Potassium: 3.4 mEq/L — ABNORMAL LOW (ref 3.5–5.3)
Sodium: 140 mEq/L (ref 135–145)

## 2014-05-19 LAB — HEPATIC FUNCTION PANEL
ALT: 15 U/L (ref 0–35)
AST: 15 U/L (ref 0–37)
Albumin: 3.9 g/dL (ref 3.5–5.2)
Alkaline Phosphatase: 145 U/L — ABNORMAL HIGH (ref 39–117)
Bilirubin, Direct: 0.1 mg/dL (ref 0.0–0.3)
Indirect Bilirubin: 0.3 mg/dL (ref 0.2–1.2)
Total Bilirubin: 0.4 mg/dL (ref 0.2–1.2)
Total Protein: 6.5 g/dL (ref 6.0–8.3)

## 2014-05-19 LAB — PHENYTOIN LEVEL, TOTAL: Phenytoin Lvl: 5.1 ug/mL — ABNORMAL LOW (ref 10.0–20.0)

## 2014-05-19 LAB — HEMOGLOBIN A1C
Hgb A1c MFr Bld: 6.2 % — ABNORMAL HIGH (ref <5.7)
Mean Plasma Glucose: 131 mg/dL — ABNORMAL HIGH (ref <117)

## 2014-05-20 ENCOUNTER — Telehealth: Payer: Self-pay | Admitting: Family Medicine

## 2014-05-20 NOTE — Telephone Encounter (Signed)
I will make sure that the no is finished. The reason that this patient is getting a CAT scan is because she's been having lower abdominal pain bloating swelling. She has had a hysterectomy but still has her ovaries. Her symptoms are consistent with the possibility of ovarian cancer. I tried this standard measures which is ultrasound but because of her significant obesity and bowel gas the ovaries could not be seen and it was recommended by the radiologist to do a CAT scan to rule out lower pelvic tumor. Thank you

## 2014-05-20 NOTE — Telephone Encounter (Signed)
Pt was seen 05/16/14, a CT of abd/Pelv with contrast has been ordered & scheduled for 05/25/14.  OV note is not complete, need clinical information to process prior auth with insurance, also need to know what we're looking for or ruling out - Please advise

## 2014-05-25 ENCOUNTER — Ambulatory Visit (HOSPITAL_COMMUNITY)
Admission: RE | Admit: 2014-05-25 | Discharge: 2014-05-25 | Disposition: A | Discharge status: Home / Self Care | Payer: PPO | Source: Ambulatory Visit | Attending: Family Medicine | Admitting: Family Medicine

## 2014-05-25 ENCOUNTER — Encounter (HOSPITAL_COMMUNITY): Payer: Self-pay

## 2014-05-25 DIAGNOSIS — R102 Pelvic and perineal pain: Secondary | ICD-10-CM | POA: Insufficient documentation

## 2014-05-25 DIAGNOSIS — R109 Unspecified abdominal pain: Secondary | ICD-10-CM | POA: Diagnosis not present

## 2014-05-25 DIAGNOSIS — M25559 Pain in unspecified hip: Secondary | ICD-10-CM

## 2014-05-25 MED ORDER — IOHEXOL 300 MG/ML  SOLN
100.0000 mL | Freq: Once | INTRAMUSCULAR | Status: AC | PRN
  Administered 2014-05-25: 100 mL via INTRAVENOUS

## 2014-06-13 ENCOUNTER — Ambulatory Visit: Payer: PPO | Admitting: Family Medicine

## 2014-07-12 ENCOUNTER — Telehealth: Payer: Self-pay | Admitting: Family Medicine

## 2014-07-12 NOTE — Telephone Encounter (Signed)
Patient brought in the other letter that she received from this Rx assistance program.  The Lyrica will actually be shipped to her home.  She does not want to worry about the Dilantin.  She is just asking to please fill the form out for the Lyrica medication.  Please review in red folder.

## 2014-07-12 NOTE — Telephone Encounter (Signed)
We refer this to county assistance program, please help steer this issue that way

## 2014-07-12 NOTE — Telephone Encounter (Signed)
Pt dropped off a form to be filled out for assistance with lyrica.

## 2014-07-13 MED ORDER — PREGABALIN 100 MG PO CAPS: 100.0000 mg | ORAL_CAPSULE | Freq: Two times a day (BID) | ORAL | Status: DC

## 2014-07-13 NOTE — Telephone Encounter (Signed)
Please go ahead and do a 90 day prescription for Lyrica for this patient with 3 additional refills. This can be forwarded along with the papers to her so she can send it to this organization that she is getting her medication from

## 2014-07-13 NOTE — Telephone Encounter (Signed)
Rx printed and forwarded with paperwork to [facility] for processing.

## 2014-08-11 ENCOUNTER — Other Ambulatory Visit: Payer: Self-pay | Admitting: Family Medicine

## 2014-09-06 ENCOUNTER — Other Ambulatory Visit: Payer: Self-pay | Admitting: *Deleted

## 2014-09-20 ENCOUNTER — Other Ambulatory Visit: Payer: Self-pay | Admitting: *Deleted

## 2014-09-20 MED ORDER — PREGABALIN 100 MG PO CAPS: 100.0000 mg | ORAL_CAPSULE | Freq: Two times a day (BID) | ORAL | Status: DC

## 2014-12-28 ENCOUNTER — Encounter: Payer: Self-pay | Admitting: Nurse Practitioner

## 2014-12-28 ENCOUNTER — Ambulatory Visit (INDEPENDENT_AMBULATORY_CARE_PROVIDER_SITE_OTHER): Payer: PPO | Admitting: Nurse Practitioner

## 2014-12-28 VITALS — BP 138/88 | Temp 99.3°F | Ht 62.0 in | Wt 246.0 lb

## 2014-12-28 DIAGNOSIS — Z79891 Long term (current) use of opiate analgesic: Secondary | ICD-10-CM

## 2014-12-28 DIAGNOSIS — J069 Acute upper respiratory infection, unspecified: Secondary | ICD-10-CM

## 2014-12-28 MED ORDER — HYDROCODONE-ACETAMINOPHEN 10-325 MG PO TABS: 1.0000 | ORAL_TABLET | Freq: Three times a day (TID) | ORAL | Status: DC | PRN

## 2014-12-28 MED ORDER — HYDROCODONE-ACETAMINOPHEN 10-325 MG PO TABS
1.0000 | ORAL_TABLET | Freq: Three times a day (TID) | ORAL | Status: DC | PRN
Start: 2014-12-28 — End: 2014-12-28

## 2014-12-29 ENCOUNTER — Encounter: Payer: Self-pay | Admitting: Nurse Practitioner

## 2014-12-29 DIAGNOSIS — Z79891 Long term (current) use of opiate analgesic: Secondary | ICD-10-CM | POA: Insufficient documentation

## 2014-12-29 NOTE — Progress Notes (Signed)
Subjective:  Presents for routine follow-up of pain management. Lyrica helping overall, depends on her activity. Pain will sometimes be 10/10, hydrocodone relieves pain. Feels her feet "draw up at times. 90 pills are lasting longer than 3 months. Does not take every day. Only takes on a when necessary basis. Has not taken her pain med in 2-3 days. Also complaints of cold symptoms that began yesterday. Very low-grade fever. Chills. Slight cough. Head congestion. Headache around the eyes at times. Slight sore throat only in the mornings. No ear pain.  Objective:   BP 138/88 mmHg  Temp(Src) 99.3 F (37.4 C) (Oral)  Ht 5\' 2"  (1.575 m)  Wt 246 lb (111.585 kg)  BMI 44.98 kg/m2 NAD. Alert, oriented. TMs clear effusion, no erythema. Pharynx mildly injected, clear PND noted. Neck supple with mild soft anterior adenopathy. Lungs clear. Heart regular rate rhythm.  Assessment:  Problem List Items Addressed This Visit      Other   Encounter for long-term opiate analgesic use - Primary    Other Visit Diagnoses    Acute upper respiratory infection/viral illness          Plan:  Meds ordered this encounter  Medications  . DISCONTD: HYDROcodone-acetaminophen (NORCO) 10-325 MG per tablet    Sig: Take 1 tablet by mouth 3 (three) times daily as needed.    Dispense:  90 tablet    Refill:  0    May fill 60 days from 12/28/14    Order Specific Question:  Supervising Provider    Answer:  Mikey Kirschner [[id number]]  . DISCONTD: HYDROcodone-acetaminophen (NORCO) 10-325 MG per tablet    Sig: Take 1 tablet by mouth 3 (three) times daily as needed.    Dispense:  90 tablet    Refill:  0    May fill 30 days from 12/28/14    Order Specific Question:  Supervising Provider    Answer:  Mikey Kirschner [[id number]]  . HYDROcodone-acetaminophen (NORCO) 10-325 MG per tablet    Sig: Take 1 tablet by mouth 3 (three) times daily as needed.    Dispense:  90 tablet    Refill:  0    Order Specific Question:  Supervising  Provider    Answer:  Mikey Kirschner [[id number]]   Encouraged activity and weight loss efforts. Continue to limit hydrocodone use as much as possible. Return in about 3 months (around 03/29/2015).

## 2015-01-26 ENCOUNTER — Encounter (INDEPENDENT_AMBULATORY_CARE_PROVIDER_SITE_OTHER): Payer: PPO | Admitting: Ophthalmology

## 2015-01-31 ENCOUNTER — Other Ambulatory Visit: Payer: Self-pay | Admitting: Family Medicine

## 2015-02-13 ENCOUNTER — Other Ambulatory Visit: Payer: Self-pay | Admitting: Family Medicine

## 2015-03-14 ENCOUNTER — Telehealth: Payer: Self-pay | Admitting: Family Medicine

## 2015-03-14 NOTE — Telephone Encounter (Signed)
Pt is wanting Abigail Butts to call her. Would not tell me why.

## 2015-03-14 NOTE — Telephone Encounter (Signed)
Talked with patient 

## 2015-03-15 ENCOUNTER — Telehealth: Payer: Self-pay | Admitting: *Deleted

## 2015-03-15 NOTE — Telephone Encounter (Signed)
I signed this form as requested please finish filling it again in sending in in thank you

## 2015-03-15 NOTE — Telephone Encounter (Signed)
Needs forms filled out to help pay for lyrica. Forms in folder in your office. Pt states deadline is dec 15th. She would like forms faxed then she would like a call to notify her this has been done.

## 2015-03-16 ENCOUNTER — Other Ambulatory Visit: Payer: Self-pay | Admitting: *Deleted

## 2015-03-16 MED ORDER — PREGABALIN 100 MG PO CAPS: 100.0000 mg | ORAL_CAPSULE | Freq: Two times a day (BID) | ORAL | Status: DC

## 2015-03-17 NOTE — Telephone Encounter (Signed)
Patient was notified that forms have been faxed.

## 2015-03-29 ENCOUNTER — Encounter: Payer: PPO | Admitting: Nurse Practitioner

## 2015-04-11 ENCOUNTER — Other Ambulatory Visit: Payer: Self-pay | Admitting: Family Medicine

## 2015-05-04 ENCOUNTER — Telehealth: Payer: Self-pay | Admitting: Family Medicine

## 2015-05-04 DIAGNOSIS — G4733 Obstructive sleep apnea (adult) (pediatric): Secondary | ICD-10-CM | POA: Diagnosis not present

## 2015-05-04 NOTE — Telephone Encounter (Signed)
Patient has been accepted into [employer] Patient Assistance Program, until 04/07/2016.  LYRICA Caps. C-V 100 mg QTY 180 has been shipped to the address of the patient.  If she needs a refill or additional Pfizer meds during enrollment period, Call [phone].

## 2015-05-06 ENCOUNTER — Other Ambulatory Visit: Payer: Self-pay | Admitting: Family Medicine

## 2015-05-18 ENCOUNTER — Other Ambulatory Visit: Payer: Self-pay | Admitting: Family Medicine

## 2015-05-31 ENCOUNTER — Encounter: Payer: Self-pay | Admitting: Family Medicine

## 2015-05-31 ENCOUNTER — Ambulatory Visit (INDEPENDENT_AMBULATORY_CARE_PROVIDER_SITE_OTHER): Payer: PPO | Admitting: Family Medicine

## 2015-05-31 VITALS — BP 130/80 | Ht 62.0 in | Wt 247.4 lb

## 2015-05-31 DIAGNOSIS — M549 Dorsalgia, unspecified: Secondary | ICD-10-CM

## 2015-05-31 DIAGNOSIS — N643 Galactorrhea not associated with childbirth: Secondary | ICD-10-CM | POA: Diagnosis not present

## 2015-05-31 DIAGNOSIS — E785 Hyperlipidemia, unspecified: Secondary | ICD-10-CM | POA: Diagnosis not present

## 2015-05-31 DIAGNOSIS — G6289 Other specified polyneuropathies: Secondary | ICD-10-CM

## 2015-05-31 DIAGNOSIS — R7303 Prediabetes: Secondary | ICD-10-CM

## 2015-05-31 DIAGNOSIS — G8929 Other chronic pain: Secondary | ICD-10-CM | POA: Diagnosis not present

## 2015-05-31 DIAGNOSIS — R569 Unspecified convulsions: Secondary | ICD-10-CM

## 2015-05-31 LAB — POCT GLYCOSYLATED HEMOGLOBIN (HGB A1C): Hemoglobin A1C: 5.9

## 2015-05-31 MED ORDER — HYDROCODONE-ACETAMINOPHEN 10-325 MG PO TABS: 1.0000 | ORAL_TABLET | Freq: Three times a day (TID) | ORAL | Status: DC | PRN

## 2015-05-31 NOTE — Progress Notes (Signed)
   Subjective:    Patient ID: Deborah Carroll, female    DOB: 1955-08-21, 60 y.o.   MRN: [id number]  HPI This patient was seen today for chronic pain  The medication list was reviewed and updated.   -Compliance with medication: yes  - Number patient states they take daily: 1-2   -when was the last dose patient took? yest  The patient was advised the importance of maintaining medication and not using illegal substances with these.  Refills needed: yes  The patient was educated that we can provide 3 monthly scripts for their medication, it is their responsibility to follow the instructions.  Side effects or complications from medications: no  Patient is aware that pain medications are meant to minimize the severity of the pain to allow their pain levels to improve to allow for better function. They are aware of that pain medications cannot totally remove their pain.  Due for UDT ( at least once per year) : utd   has history of neuropathy. History of prediabetes checking A1c today she watches her diet  She know she needs a lose weight she plans on exercising more  reflexes been under good control with medication not having heartburn Denies any seizure issues  25 minutes was spent with the patient. Greater than half the time was spent in discussion and answering questions and counseling regarding the issues that the patient came in for today.      Review of Systems  she denies high fevers chills sweats nausea vomiting diarrhea denies chest pain bloody stools she does relate burning in tenderness in her lower legs from the neuropathy    Objective:   Physical Exam  neck no masses lungs clear heart regular no murmurs pulse normal BP good abdomen soft obese extremities no edema skin warm dry       Assessment & Plan:   prediabetes under better control Neuropathy continue Lyrica 1 per day patient states when she is taken twice a day it made her feel funny sometimes depressed but 1  a day does well Pain medication refills patient denies abusing it Reflux under good control continue Protonix Seizures no reoccurrence continue medication   patient states anxiousness under good control tolerating Celexa  patient is due for lipid profile because her hyperlipidemia.  she is due CBC metabolic 7 liver profile because of her chronic meds

## 2015-06-05 DIAGNOSIS — H35371 Puckering of macula, right eye: Secondary | ICD-10-CM | POA: Diagnosis not present

## 2015-06-05 DIAGNOSIS — H2511 Age-related nuclear cataract, right eye: Secondary | ICD-10-CM | POA: Diagnosis not present

## 2015-06-05 DIAGNOSIS — H25041 Posterior subcapsular polar age-related cataract, right eye: Secondary | ICD-10-CM | POA: Diagnosis not present

## 2015-06-13 DIAGNOSIS — H2511 Age-related nuclear cataract, right eye: Secondary | ICD-10-CM | POA: Diagnosis not present

## 2015-06-13 DIAGNOSIS — H2513 Age-related nuclear cataract, bilateral: Secondary | ICD-10-CM | POA: Diagnosis not present

## 2015-06-13 NOTE — Patient Instructions (Signed)
Your procedure is scheduled on:   06/20/2015              Report to [facility] at  11:00   AM.  Call this number if you have problems the morning of surgery: [phone]   Remember:   Do not eat or drink :After Midnight.    Take these medicines the morning of surgery with A SIP OF WATER:     Celexa, Protonix and Dilantin       Do not wear jewelry, make-up or nail polish.  Do not wear lotions, powders, or perfumes. You may wear deodorant.  Do not bring valuables to the hospital.  Contacts, dentures or bridgework may not be worn into surgery.  Patients discharged the day of surgery will not be allowed to drive home.  Name and phone number of your driver:    [email or web address] @ Cataract Surgery  A cataract is a clouding of the lens of the eye. When a lens becomes cloudy, vision is reduced based on the degree and nature of the clouding. Surgery may be needed to improve vision. Surgery removes the cloudy lens and usually replaces it with a substitute lens (intraocular lens, IOL). LET YOUR EYE DOCTOR KNOW ABOUT:  Allergies to food or medicine.   Medicines taken including herbs, eyedrops, over-the-counter medicines, and creams.   Use of steroids (by mouth or creams).   Previous problems with anesthetics or numbing medicine.   History of bleeding problems or blood clots.   Previous surgery.   Other health problems, including diabetes and kidney problems.   Possibility of pregnancy, if this applies.  RISKS AND COMPLICATIONS  Infection.   Inflammation of the eyeball (endophthalmitis) that can spread to both eyes (sympathetic ophthalmia).   Poor wound healing.   If an IOL is inserted, it can later fall out of proper position. This is very uncommon.   Clouding of the part of your eye that holds an IOL in place. This is called an "after-cataract." These are uncommon, but easily treated.  BEFORE THE PROCEDURE  Do not eat or drink anything except small amounts of water for 8 to 12  before your surgery, or as directed by your caregiver.   Unless you are told otherwise, continue any eyedrops you have been prescribed.   Talk to your primary caregiver about all other medicines that you take (both prescription and non-prescription). In some cases, you may need to stop or change medicines near the time of your surgery. This is most important if you are taking blood-thinning medicine.Do not stop medicines unless you are told to do so.   Arrange for someone to drive you to and from the procedure.   Do not put contact lenses in either eye on the day of your surgery.  PROCEDURE There is more than one method for safely removing a cataract. Your doctor can explain the differences and help determine which is best for you. Phacoemulsification surgery is the most common form of cataract surgery.  An injection is given behind the eye or eyedrops are given to make this a painless procedure.   A small cut (incision) is made on the edge of the clear, dome-shaped surface that covers the front of the eye (cornea).   A tiny probe is painlessly inserted into the eye. This device gives off ultrasound waves that soften and break up the cloudy center of the lens. This makes it easier for the cloudy lens to be removed by suction.   An IOL  may be implanted.   The normal lens of the eye is covered by a clear capsule. Part of that capsule is intentionally left in the eye to support the IOL.   Your surgeon may or may not use stitches to close the incision.  There are other forms of cataract surgery that require a larger incision and stiches to close the eye. This approach is taken in cases where the doctor feels that the cataract cannot be easily removed using phacoemulsification. AFTER THE PROCEDURE  When an IOL is implanted, it does not need care. It becomes a permanent part of your eye and cannot be seen or felt.   Your doctor will schedule follow-up exams to check on your progress.    Review your other medicines with your doctor to see which can be resumed after surgery.   Use eyedrops or take medicine as prescribed by your doctor.  Document Released: 03/14/2011 Document Reviewed: 03/11/2011 [facility] Patient Information 2012 [place].  .Cataract Surgery Care After Refer to this sheet in the next few weeks. These instructions provide you with information on caring for yourself after your procedure. Your caregiver may also give you more specific instructions. Your treatment has been planned according to current medical practices, but problems sometimes occur. Call your caregiver if you have any problems or questions after your procedure.  HOME CARE INSTRUCTIONS   Avoid strenuous activities as directed by your caregiver.   Ask your caregiver when you can resume driving.   Use eyedrops or other medicines to help healing and control pressure inside your eye as directed by your caregiver.   Only take over-the-counter or prescription medicines for pain, discomfort, or fever as directed by your caregiver.   Do not to touch or rub your eyes.   You may be instructed to use a protective shield during the first few days and nights after surgery. If not, wear sunglasses to protect your eyes. This is to protect the eye from pressure or from being accidentally bumped.   Keep the area around your eye clean and dry. Avoid swimming or allowing water to hit you directly in the face while showering. Keep soap and shampoo out of your eyes.   Do not bend or lift heavy objects. Bending increases pressure in the eye. You can walk, climb stairs, and do light household chores.   Do not put a contact lens into the eye that had surgery until your caregiver says it is okay to do so.   Ask your doctor when you can return to work. This will depend on the kind of work that you do. If you work in a dusty environment, you may be advised to wear protective eyewear for a period of time.    Ask your caregiver when it will be safe to engage in sexual activity.   Continue with your regular eye exams as directed by your caregiver.  What to expect:  It is normal to feel itching and mild discomfort for a few days after cataract surgery. Some fluid discharge is also common, and your eye may be sensitive to light and touch.   After 1 to 2 days, even moderate discomfort should disappear. In most cases, healing will take about 6 weeks.   If you received an intraocular lens (IOL), you may notice that colors are very bright or have a blue tinge. Also, if you have been in bright sunlight, everything may appear reddish for a few hours. If you see these color tinges, it  is because your lens is clear and no longer cloudy. Within a few months after receiving an IOL, these extra colors should go away. When you have healed, you will probably need new glasses.  SEEK MEDICAL CARE IF:   You have increased bruising around your eye.   You have discomfort not helped by medicine.  SEEK IMMEDIATE MEDICAL CARE IF:   You have a fever.   You have a worsening or sudden vision loss.   You have redness, swelling, or increasing pain in the eye.   You have a thick discharge from the eye that had surgery.  MAKE SURE YOU:  Understand these instructions.   Will watch your condition.   Will get help right away if you are not doing well or get worse.  Document Released: 10/12/2004 Document Revised: 03/14/2011 Document Reviewed: 11/16/2010 [facility] Patient Information 2012 [place].    Monitored Anesthesia Care  Monitored anesthesia care is an anesthesia service for a medical procedure. Anesthesia is the loss of the ability to feel pain. It is produced by medications called anesthetics. It may affect a small area of your body (local anesthesia), a large area of your body (regional anesthesia), or your entire body (general anesthesia). The need for monitored anesthesia care depends your  procedure, your condition, and the potential need for regional or general anesthesia. It is often provided during procedures where:   General anesthesia may be needed if there are complications. This is because you need special care when you are under general anesthesia.   You will be under local or regional anesthesia. This is so that you are able to have higher levels of anesthesia if needed.   You will receive calming medications (sedatives). This is especially the case if sedatives are given to put you in a semi-conscious state of relaxation (deep sedation). This is because the amount of sedative needed to produce this state can be hard to predict. Too much of a sedative can produce general anesthesia. Monitored anesthesia care is performed by one or more caregivers who have special training in all types of anesthesia. You will need to meet with these caregivers before your procedure. During this meeting, they will ask you about your medical history. They will also give you instructions to follow. (For example, you will need to stop eating and drinking before your procedure. You may also need to stop or change medications you are taking.) During your procedure, your caregivers will stay with you. They will:   Watch your condition. This includes watching you blood pressure, breathing, and level of pain.   Diagnose and treat problems that occur.   Give medications if they are needed. These may include calming medications (sedatives) and anesthetics.   Make sure you are comfortable.  Having monitored anesthesia care does not necessarily mean that you will be under anesthesia. It does mean that your caregivers will be able to manage anesthesia if you need it or if it occurs. It also means that you will be able to have a different type of anesthesia than you are having if you need it. When your procedure is complete, your caregivers will continue to watch your condition. They will make sure any  medications wear off before you are allowed to go home.  Document Released: 12/19/2004 Document Revised: 07/20/2012 Document Reviewed: 05/06/2012 [facility] Patient Information 2014 [place], [place].

## 2015-06-16 ENCOUNTER — Encounter (HOSPITAL_COMMUNITY)
Admission: RE | Admit: 2015-06-16 | Discharge: 2015-06-16 | Disposition: A | Discharge status: Home / Self Care | Payer: PPO | Source: Ambulatory Visit | Attending: Ophthalmology | Admitting: Ophthalmology

## 2015-06-16 ENCOUNTER — Encounter (HOSPITAL_COMMUNITY): Payer: Self-pay

## 2015-06-16 ENCOUNTER — Other Ambulatory Visit: Payer: Self-pay

## 2015-06-16 DIAGNOSIS — Z01812 Encounter for preprocedural laboratory examination: Secondary | ICD-10-CM | POA: Insufficient documentation

## 2015-06-16 DIAGNOSIS — Z01810 Encounter for preprocedural cardiovascular examination: Secondary | ICD-10-CM | POA: Insufficient documentation

## 2015-06-16 LAB — CBC
HCT: 41.1 % (ref 36.0–46.0)
Hemoglobin: 13.7 g/dL (ref 12.0–15.0)
MCH: 29.2 pg (ref 26.0–34.0)
MCHC: 33.3 g/dL (ref 30.0–36.0)
MCV: 87.6 fL (ref 78.0–100.0)
Platelets: 190 10*3/uL (ref 150–400)
RBC: 4.69 MIL/uL (ref 3.87–5.11)
RDW: 13.6 % (ref 11.5–15.5)
WBC: 13.2 10*3/uL — ABNORMAL HIGH (ref 4.0–10.5)

## 2015-06-16 LAB — BASIC METABOLIC PANEL
Anion gap: 8 (ref 5–15)
BUN: 8 mg/dL (ref 6–20)
CO2: 25 mmol/L (ref 22–32)
Calcium: 8.8 mg/dL — ABNORMAL LOW (ref 8.9–10.3)
Chloride: 105 mmol/L (ref 101–111)
Creatinine, Ser: 0.69 mg/dL (ref 0.44–1.00)
GFR calc Af Amer: >60 mL/min (ref >60)
GFR calc non Af Amer: >60 mL/min (ref >60)
Glucose, Bld: 89 mg/dL (ref 65–99)
Potassium: 3.8 mmol/L (ref 3.5–5.1)
Sodium: 138 mmol/L (ref 135–145)

## 2015-06-20 ENCOUNTER — Ambulatory Visit (HOSPITAL_COMMUNITY): Payer: PPO | Admitting: Anesthesiology

## 2015-06-20 ENCOUNTER — Encounter (HOSPITAL_COMMUNITY): Payer: Self-pay | Admitting: *Deleted

## 2015-06-20 ENCOUNTER — Encounter (HOSPITAL_COMMUNITY)
Admission: RE | Disposition: A | Discharge status: Home / Self Care | Payer: Self-pay | Source: Ambulatory Visit | Attending: Ophthalmology

## 2015-06-20 ENCOUNTER — Ambulatory Visit (HOSPITAL_COMMUNITY)
Admission: RE | Admit: 2015-06-20 | Discharge: 2015-06-20 | Disposition: A | Discharge status: Home / Self Care | Payer: PPO | Source: Ambulatory Visit | Attending: Ophthalmology | Admitting: Ophthalmology

## 2015-06-20 DIAGNOSIS — R569 Unspecified convulsions: Secondary | ICD-10-CM | POA: Diagnosis not present

## 2015-06-20 DIAGNOSIS — H268 Other specified cataract: Secondary | ICD-10-CM | POA: Insufficient documentation

## 2015-06-20 DIAGNOSIS — I10 Essential (primary) hypertension: Secondary | ICD-10-CM | POA: Diagnosis not present

## 2015-06-20 DIAGNOSIS — G4730 Sleep apnea, unspecified: Secondary | ICD-10-CM | POA: Diagnosis not present

## 2015-06-20 DIAGNOSIS — Z79899 Other long term (current) drug therapy: Secondary | ICD-10-CM | POA: Insufficient documentation

## 2015-06-20 DIAGNOSIS — F419 Anxiety disorder, unspecified: Secondary | ICD-10-CM | POA: Insufficient documentation

## 2015-06-20 DIAGNOSIS — F329 Major depressive disorder, single episode, unspecified: Secondary | ICD-10-CM | POA: Diagnosis not present

## 2015-06-20 DIAGNOSIS — H269 Unspecified cataract: Secondary | ICD-10-CM | POA: Diagnosis not present

## 2015-06-20 DIAGNOSIS — H2511 Age-related nuclear cataract, right eye: Secondary | ICD-10-CM | POA: Diagnosis not present

## 2015-06-20 HISTORY — PX: CATARACT EXTRACTION W/PHACO: SHX586

## 2015-06-20 SURGERY — PHACOEMULSIFICATION, CATARACT, WITH IOL INSERTION
Anesthesia: Monitor Anesthesia Care | Site: Eye | Laterality: Right

## 2015-06-20 MED ORDER — FENTANYL CITRATE (PF) 100 MCG/2ML IJ SOLN
25.0000 ug | INTRAMUSCULAR | Status: AC
  Administered 2015-06-20 (×2): 25 ug via INTRAVENOUS

## 2015-06-20 MED ORDER — MIDAZOLAM HCL 2 MG/2ML IJ SOLN
1.0000 mg | INTRAMUSCULAR | Status: DC | PRN
Start: 2015-06-20 — End: 2015-06-20
  Administered 2015-06-20: 2 mg via INTRAVENOUS

## 2015-06-20 MED ORDER — MIDAZOLAM HCL 5 MG/5ML IJ SOLN
INTRAMUSCULAR | Status: DC | PRN
  Administered 2015-06-20: 2 mg via INTRAVENOUS

## 2015-06-20 MED ORDER — TETRACAINE HCL 0.5 % OP SOLN
1.0000 [drp] | OPHTHALMIC | Status: AC
  Administered 2015-06-20 (×3): 1 [drp] via OPHTHALMIC

## 2015-06-20 MED ORDER — TETRACAINE 0.5 % OP SOLN OPTIME - NO CHARGE
OPHTHALMIC | Status: DC | PRN
  Administered 2015-06-20: 2 [drp] via OPHTHALMIC

## 2015-06-20 MED ORDER — MIDAZOLAM HCL 2 MG/2ML IJ SOLN
INTRAMUSCULAR | Status: AC
  Filled 2015-06-20: qty 2

## 2015-06-20 MED ORDER — PROVISC 10 MG/ML IO SOLN
INTRAOCULAR | Status: DC | PRN
  Administered 2015-06-20: 0.85 mL via INTRAOCULAR

## 2015-06-20 MED ORDER — LACTATED RINGERS IV SOLN
INTRAVENOUS | Status: DC
  Administered 2015-06-20: 10:00:00 via INTRAVENOUS

## 2015-06-20 MED ORDER — EPINEPHRINE HCL 1 MG/ML IJ SOLN
INTRAOCULAR | Status: DC | PRN
  Administered 2015-06-20: 500 mL

## 2015-06-20 MED ORDER — KETOROLAC TROMETHAMINE 0.5 % OP SOLN
1.0000 [drp] | OPHTHALMIC | Status: AC
  Administered 2015-06-20 (×3): 1 [drp] via OPHTHALMIC

## 2015-06-20 MED ORDER — EPINEPHRINE HCL 1 MG/ML IJ SOLN
INTRAMUSCULAR | Status: AC
  Filled 2015-06-20: qty 1

## 2015-06-20 MED ORDER — CYCLOPENTOLATE-PHENYLEPHRINE 0.2-1 % OP SOLN
1.0000 [drp] | OPHTHALMIC | Status: AC
  Administered 2015-06-20 (×3): 1 [drp] via OPHTHALMIC

## 2015-06-20 MED ORDER — FENTANYL CITRATE (PF) 100 MCG/2ML IJ SOLN
INTRAMUSCULAR | Status: AC
  Filled 2015-06-20: qty 2

## 2015-06-20 MED ORDER — PHENYLEPHRINE HCL 2.5 % OP SOLN
1.0000 [drp] | OPHTHALMIC | Status: AC
  Administered 2015-06-20 (×3): 1 [drp] via OPHTHALMIC

## 2015-06-20 MED ORDER — BSS IO SOLN
INTRAOCULAR | Status: DC | PRN
  Administered 2015-06-20: 15 mL

## 2015-06-20 SURGICAL SUPPLY — 10 items
CLOTH BEACON ORANGE TIMEOUT ST (SAFETY) ×1 IMPLANT
EYE SHIELD UNIVERSAL CLEAR (GAUZE/BANDAGES/DRESSINGS) ×1 IMPLANT
GLOVE BIOGEL PI IND STRL 7.0 (GLOVE) IMPLANT
GLOVE BIOGEL PI INDICATOR 7.0 (GLOVE) ×1
GLOVE EXAM NITRILE MD LF STRL (GLOVE) ×1 IMPLANT
LENS ALC ACRYL/TECN (Ophthalmic Related) ×2 IMPLANT
PAD ARMBOARD 7.5X6 YLW CONV (MISCELLANEOUS) ×1 IMPLANT
TAPE SURG TRANSPORE 1 IN (GAUZE/BANDAGES/DRESSINGS) IMPLANT
TAPE SURGICAL TRANSPORE 1 IN (GAUZE/BANDAGES/DRESSINGS) ×1
WATER STERILE IRR 250ML POUR (IV SOLUTION) ×1 IMPLANT

## 2015-06-20 NOTE — Anesthesia Preprocedure Evaluation (Signed)
Anesthesia Evaluation  Patient identified by MRN, date of birth, ID band Patient awake    Reviewed: Allergy & Precautions, NPO status   Airway Mallampati: II  TM Distance: >3 FB     Dental  (+) Teeth Intact   Pulmonary sleep apnea and Continuous Positive Airway Pressure Ventilation ,    breath sounds clear to auscultation       Cardiovascular hypertension,  Rhythm:Regular Rate:Normal     Neuro/Psych Seizures -,  PSYCHIATRIC DISORDERS Anxiety Depression  Neuromuscular disease    GI/Hepatic   Endo/Other  Morbid obesity  Renal/GU      Musculoskeletal   Abdominal   Peds  Hematology   Anesthesia Other Findings   Reproductive/Obstetrics                             Anesthesia Physical Anesthesia Plan  ASA: III  Anesthesia Plan: MAC   Post-op Pain Management:    Induction: Intravenous  Airway Management Planned: Simple Face Mask  Additional Equipment:   Intra-op Plan:   Post-operative Plan: Extubation in OR  Informed Consent: I have reviewed the patients History and Physical, chart, labs and discussed the procedure including the risks, benefits and alternatives for the proposed anesthesia with the patient or authorized representative who has indicated his/her understanding and acceptance.     Plan Discussed with:   Anesthesia Plan Comments:         Anesthesia Quick Evaluation

## 2015-06-20 NOTE — H&P (Signed)
The patient was re examined and there is no change in the patients condition since the original H and P. 

## 2015-06-20 NOTE — Discharge Instructions (Signed)
°  °          [facility] Instructions °[address]- [place] [address]-[place] °    ° °1. Avoid closing eyes tightly. One often closes the eye tightly when laughing, talking, sneezing, coughing or if they feel irritated. At these times, you should be careful not to close your eyes tightly. ° °2. Instill eye drops as instructed. To instill drops in your eye, open it, look up and have someone gently pull the lower lid down and instill a couple of drops inside the lower lid. ° °3. Do not touch upper lid. ° °4. Take Advil or Tylenol for pain. ° °5. You may use either eye for near work, such as reading or sewing and you may watch television. ° °6. You may have your hair done at the beauty parlor at any time. ° °7. Wear dark glasses with or without your own glasses if you are in bright light. ° °8. Call our office at [phone] or [phone] if you have sharp pain in your eye or unusual symptoms. ° °9.  FOLLOW UP WITH DR. SHAPIRO TODAY IN HIS [place] OFFICE AT 2:45pm. ° °  °I have received a copy of the above instructions and will follow them.  ° ° ° °IF YOU ARE IN IMMEDIATE DANGER CALL 911! ° °It is important for you to keep your follow-up appointment with your physician after discharge, OR, for you /your caregiver to make a follow-up appointment with your physician / medical provider after discharge. ° °Show these instructions to the next healthcare provider you see. ° Anesthesia, Adult, Care After °Refer to this sheet in the next few weeks. These instructions provide you with information on caring for yourself after your procedure. Your health care provider may also give you more specific instructions. Your treatment has been planned according to current medical practices, but problems sometimes occur. Call your health care provider if you have any problems or questions after your procedure. °WHAT TO EXPECT AFTER THE PROCEDURE °After the procedure, it is typical to  experience: °· Sleepiness. °· Nausea and vomiting. °HOME CARE INSTRUCTIONS °· For the first 24 hours after general anesthesia: °¨ Have a responsible person with you. °¨ Do not drive a car. If you are alone, do not take public transportation. °¨ Do not drink alcohol. °¨ Do not take medicine that has not been prescribed by your health care provider. °¨ Do not sign important papers or make important decisions. °¨ You may resume a normal diet and activities as directed by your health care provider. °· Change bandages (dressings) as directed. °· If you have questions or problems that seem related to general anesthesia, call the hospital and ask for the anesthetist or anesthesiologist on call. °SEEK MEDICAL CARE IF: °· You have nausea and vomiting that continue the day after anesthesia. °· You develop a rash. °SEEK IMMEDIATE MEDICAL CARE IF:  °· You have difficulty breathing. °· You have chest pain. °· You have any allergic problems. °  °This information is not intended to replace advice given to you by your health care provider. Make sure you discuss any questions you have with your health care provider. °  °Document Released: 07/01/2000 Document Revised: 04/15/2014 Document Reviewed: 07/24/2011 °Elsevier Interactive Patient Education ©2016 [employer]. ° °

## 2015-06-20 NOTE — Anesthesia Postprocedure Evaluation (Signed)
Anesthesia Post Note  Patient: Deborah Carroll  Procedure(s) Performed: Procedure(s) (LRB): CATARACT EXTRACTION PHACO AND INTRAOCULAR LENS PLACEMENT (IOC) (Right)  Patient location during evaluation: Short Stay Anesthesia Type: MAC Level of consciousness: awake and awake and alert Pain management: pain level controlled Respiratory status: spontaneous breathing Cardiovascular status: blood pressure returned to baseline Postop Assessment: no signs of nausea or vomiting Anesthetic complications: no    Last Vitals:  Filed Vitals:   06/20/15 1025 06/20/15 1030  BP: 156/87 152/82  Temp:    Resp: 23 15    Last Pain: There were no vitals filed for this visit.               Asuncion Tapscott

## 2015-06-20 NOTE — Transfer of Care (Signed)
Immediate Anesthesia Transfer of Care Note  Patient: Deborah Carroll  Procedure(s) Performed: Procedure(s) with comments: CATARACT EXTRACTION PHACO AND INTRAOCULAR LENS PLACEMENT (IOC) (Right) - CDE:6.25  Patient Location: Short Stay  Anesthesia Type:MAC  Level of Consciousness: awake, alert  and oriented  Airway & Oxygen Therapy: Patient Spontanous Breathing  Post-op Assessment: Report given to RN  Post vital signs: Reviewed  Last Vitals:  Filed Vitals:   06/20/15 1025 06/20/15 1030  BP: 156/87 152/82  Temp:    Resp: 23 15    Complications: No apparent anesthesia complications

## 2015-06-20 NOTE — Op Note (Signed)
Patient brought to the operating room and prepped and draped in the usual manner.  Lid speculum inserted in right eye.  Stab incision made at the twelve o'clock position.  Provisc instilled in the anterior chamber.   A 2.4 mm. Stab incision was made temporally.  An anterior capsulotomy was done with a bent 25 gauge needle.  The nucleus was hydrodissected.  The Phaco tip was inserted in the anterior chamber and the nucleus was emulsified.  CDE was 6.25.  The cortical material was then removed with the I and A tip.  Posterior capsule was the polished.  The anterior chamber was deepened with Provisc.  A 20.5 Diopter Hoya Model 250 IOL was then inserted in the capsular bag.  Provisc was then removed with the I and A tip.  The wound was then hydrated.  Patient sent to the Recovery Room in good condition with follow up in my office.  Preoperative Diagnosis:  Nuclear Cataract OD Postoperative Diagnosis:  Same Procedure name: Kelman Phacoemulsification OD with IOL

## 2015-06-21 ENCOUNTER — Encounter (HOSPITAL_COMMUNITY): Payer: Self-pay | Admitting: Ophthalmology

## 2015-06-26 DIAGNOSIS — H533 Other and unspecified disorders of binocular vision: Secondary | ICD-10-CM | POA: Diagnosis not present

## 2015-06-26 DIAGNOSIS — Z961 Presence of intraocular lens: Secondary | ICD-10-CM | POA: Diagnosis not present

## 2015-06-26 DIAGNOSIS — H5319 Other subjective visual disturbances: Secondary | ICD-10-CM | POA: Diagnosis not present

## 2015-07-18 ENCOUNTER — Other Ambulatory Visit: Payer: Self-pay | Admitting: Family Medicine

## 2015-08-03 DIAGNOSIS — G4733 Obstructive sleep apnea (adult) (pediatric): Secondary | ICD-10-CM | POA: Diagnosis not present

## 2015-08-07 ENCOUNTER — Other Ambulatory Visit: Payer: Self-pay | Admitting: Family Medicine

## 2015-08-07 NOTE — Telephone Encounter (Signed)
May renew 5

## 2015-08-08 DIAGNOSIS — H2512 Age-related nuclear cataract, left eye: Secondary | ICD-10-CM | POA: Diagnosis not present

## 2015-08-10 ENCOUNTER — Encounter (HOSPITAL_COMMUNITY): Payer: Self-pay

## 2015-08-11 ENCOUNTER — Encounter (HOSPITAL_COMMUNITY)
Admission: RE | Admit: 2015-08-11 | Discharge: 2015-08-11 | Disposition: A | Discharge status: Home / Self Care | Payer: PPO | Source: Ambulatory Visit | Attending: Ophthalmology | Admitting: Ophthalmology

## 2015-08-15 ENCOUNTER — Ambulatory Visit (HOSPITAL_COMMUNITY)
Admission: RE | Admit: 2015-08-15 | Discharge: 2015-08-15 | Disposition: A | Discharge status: Home / Self Care | Payer: PPO | Source: Ambulatory Visit | Attending: Ophthalmology | Admitting: Ophthalmology

## 2015-08-15 ENCOUNTER — Encounter (HOSPITAL_COMMUNITY)
Admission: RE | Disposition: A | Discharge status: Home / Self Care | Payer: Self-pay | Source: Ambulatory Visit | Attending: Ophthalmology

## 2015-08-15 ENCOUNTER — Encounter (HOSPITAL_COMMUNITY): Payer: Self-pay | Admitting: Anesthesiology

## 2015-08-15 SURGERY — PHACOEMULSIFICATION, CATARACT, WITH IOL INSERTION
Anesthesia: Monitor Anesthesia Care | Laterality: Left

## 2015-08-15 MED ORDER — KETOROLAC TROMETHAMINE 0.5 % OP SOLN: 1.0000 [drp] | OPHTHALMIC | Status: DC

## 2015-08-15 MED ORDER — CYCLOPENTOLATE-PHENYLEPHRINE 0.2-1 % OP SOLN: 1.0000 [drp] | OPHTHALMIC | Status: DC

## 2015-08-15 MED ORDER — EPINEPHRINE HCL 1 MG/ML IJ SOLN
INTRAMUSCULAR | Status: AC
  Filled 2015-08-15: qty 1

## 2015-08-15 MED ORDER — PHENYLEPHRINE HCL 2.5 % OP SOLN: 1.0000 [drp] | OPHTHALMIC | Status: DC

## 2015-08-15 MED ORDER — TETRACAINE HCL 0.5 % OP SOLN: 1.0000 [drp] | OPHTHALMIC | Status: DC

## 2015-08-15 NOTE — H&P (Signed)
The patient was re examined and there is no change in the patients condition since the original H and P. 

## 2015-08-22 ENCOUNTER — Encounter (HOSPITAL_COMMUNITY): Payer: Self-pay

## 2015-08-23 ENCOUNTER — Encounter (HOSPITAL_COMMUNITY)
Admission: RE | Admit: 2015-08-23 | Discharge: 2015-08-23 | Disposition: A | Discharge status: Home / Self Care | Payer: PPO | Source: Ambulatory Visit | Attending: Ophthalmology | Admitting: Ophthalmology

## 2015-08-29 ENCOUNTER — Ambulatory Visit (HOSPITAL_COMMUNITY): Payer: PPO | Admitting: Anesthesiology

## 2015-08-29 ENCOUNTER — Encounter (HOSPITAL_COMMUNITY): Payer: Self-pay | Admitting: *Deleted

## 2015-08-29 ENCOUNTER — Encounter (HOSPITAL_COMMUNITY)
Admission: RE | Disposition: A | Discharge status: Home / Self Care | Payer: Self-pay | Source: Ambulatory Visit | Attending: Ophthalmology

## 2015-08-29 ENCOUNTER — Ambulatory Visit (HOSPITAL_COMMUNITY)
Admission: RE | Admit: 2015-08-29 | Discharge: 2015-08-29 | Disposition: A | Discharge status: Home / Self Care | Payer: PPO | Source: Ambulatory Visit | Attending: Ophthalmology | Admitting: Ophthalmology

## 2015-08-29 DIAGNOSIS — H269 Unspecified cataract: Secondary | ICD-10-CM | POA: Diagnosis not present

## 2015-08-29 DIAGNOSIS — I10 Essential (primary) hypertension: Secondary | ICD-10-CM | POA: Diagnosis not present

## 2015-08-29 DIAGNOSIS — R569 Unspecified convulsions: Secondary | ICD-10-CM | POA: Diagnosis not present

## 2015-08-29 DIAGNOSIS — H268 Other specified cataract: Secondary | ICD-10-CM | POA: Diagnosis not present

## 2015-08-29 DIAGNOSIS — Z79899 Other long term (current) drug therapy: Secondary | ICD-10-CM | POA: Diagnosis not present

## 2015-08-29 DIAGNOSIS — F418 Other specified anxiety disorders: Secondary | ICD-10-CM | POA: Insufficient documentation

## 2015-08-29 DIAGNOSIS — H2512 Age-related nuclear cataract, left eye: Secondary | ICD-10-CM | POA: Diagnosis not present

## 2015-08-29 HISTORY — PX: CATARACT EXTRACTION W/PHACO: SHX586

## 2015-08-29 SURGERY — PHACOEMULSIFICATION, CATARACT, WITH IOL INSERTION
Anesthesia: Monitor Anesthesia Care | Site: Eye | Laterality: Left

## 2015-08-29 MED ORDER — LIDOCAINE HCL (PF) 1 % IJ SOLN
INTRAMUSCULAR | Status: DC | PRN
  Administered 2015-08-29: .7 mL

## 2015-08-29 MED ORDER — KETOROLAC TROMETHAMINE 0.5 % OP SOLN
1.0000 [drp] | OPHTHALMIC | Status: AC
  Administered 2015-08-29 (×3): 1 [drp] via OPHTHALMIC

## 2015-08-29 MED ORDER — MIDAZOLAM HCL 2 MG/2ML IJ SOLN
INTRAMUSCULAR | Status: AC
  Filled 2015-08-29: qty 2

## 2015-08-29 MED ORDER — FENTANYL CITRATE (PF) 100 MCG/2ML IJ SOLN
INTRAMUSCULAR | Status: AC
  Filled 2015-08-29: qty 2

## 2015-08-29 MED ORDER — BSS IO SOLN
INTRAOCULAR | Status: DC | PRN
  Administered 2015-08-29: 15 mL

## 2015-08-29 MED ORDER — LACTATED RINGERS IV SOLN
INTRAVENOUS | Status: DC
  Administered 2015-08-29: 08:00:00 via INTRAVENOUS

## 2015-08-29 MED ORDER — FENTANYL CITRATE (PF) 100 MCG/2ML IJ SOLN
25.0000 ug | INTRAMUSCULAR | Status: AC
  Administered 2015-08-29: 25 ug via INTRAVENOUS

## 2015-08-29 MED ORDER — FENTANYL CITRATE (PF) 100 MCG/2ML IJ SOLN: 25.0000 ug | INTRAMUSCULAR | Status: DC | PRN

## 2015-08-29 MED ORDER — EPINEPHRINE HCL 1 MG/ML IJ SOLN
INTRAMUSCULAR | Status: DC | PRN
  Administered 2015-08-29: 500 mL

## 2015-08-29 MED ORDER — PROVISC 10 MG/ML IO SOLN
INTRAOCULAR | Status: DC | PRN
  Administered 2015-08-29: 0.85 mL via INTRAOCULAR

## 2015-08-29 MED ORDER — ONDANSETRON HCL 4 MG/2ML IJ SOLN: 4.0000 mg | Freq: Once | INTRAMUSCULAR | Status: DC | PRN

## 2015-08-29 MED ORDER — CYCLOPENTOLATE-PHENYLEPHRINE 0.2-1 % OP SOLN
1.0000 [drp] | OPHTHALMIC | Status: AC
  Administered 2015-08-29 (×3): 1 [drp] via OPHTHALMIC

## 2015-08-29 MED ORDER — LIDOCAINE HCL (PF) 1 % IJ SOLN
INTRAMUSCULAR | Status: AC
  Filled 2015-08-29: qty 2

## 2015-08-29 MED ORDER — MIDAZOLAM HCL 2 MG/2ML IJ SOLN
1.0000 mg | INTRAMUSCULAR | Status: DC | PRN
  Administered 2015-08-29: 2 mg via INTRAVENOUS

## 2015-08-29 MED ORDER — TETRACAINE 0.5 % OP SOLN OPTIME - NO CHARGE
OPHTHALMIC | Status: DC | PRN
  Administered 2015-08-29: 2 [drp] via OPHTHALMIC

## 2015-08-29 MED ORDER — TETRACAINE HCL 0.5 % OP SOLN
1.0000 [drp] | OPHTHALMIC | Status: AC
  Administered 2015-08-29 (×3): 1 [drp] via OPHTHALMIC

## 2015-08-29 MED ORDER — PHENYLEPHRINE HCL 2.5 % OP SOLN
1.0000 [drp] | OPHTHALMIC | Status: AC
  Administered 2015-08-29 (×3): 1 [drp] via OPHTHALMIC

## 2015-08-29 SURGICAL SUPPLY — 10 items
CLOTH BEACON ORANGE TIMEOUT ST (SAFETY) ×1 IMPLANT
EYE SHIELD UNIVERSAL CLEAR (GAUZE/BANDAGES/DRESSINGS) ×1 IMPLANT
GLOVE BIO SURGEON STRL SZ 6.5 (GLOVE) ×1 IMPLANT
GLOVE SKINSENSE NS SZ6.5 (GLOVE) ×1
GLOVE SKINSENSE STRL SZ6.5 (GLOVE) IMPLANT
LENS ALC ACRYL/TECN (Ophthalmic Related) ×2 IMPLANT
PAD ARMBOARD 7.5X6 YLW CONV (MISCELLANEOUS) ×1 IMPLANT
TAPE SURG TRANSPARENT 2IN (GAUZE/BANDAGES/DRESSINGS) IMPLANT
TAPE TRANSPARENT 2IN (GAUZE/BANDAGES/DRESSINGS) ×1
WATER STERILE IRR 250ML POUR (IV SOLUTION) ×1 IMPLANT

## 2015-08-29 NOTE — Anesthesia Preprocedure Evaluation (Signed)
Anesthesia Evaluation  Patient identified by MRN, date of birth, ID band Patient awake    Reviewed: Allergy & Precautions, NPO status   Airway Mallampati: II  TM Distance: >3 FB     Dental  (+) Teeth Intact   Pulmonary sleep apnea and Continuous Positive Airway Pressure Ventilation ,    breath sounds clear to auscultation       Cardiovascular hypertension,  Rhythm:Regular Rate:Normal     Neuro/Psych Seizures -,  PSYCHIATRIC DISORDERS Anxiety Depression  Neuromuscular disease    GI/Hepatic   Endo/Other  Morbid obesity  Renal/GU      Musculoskeletal   Abdominal   Peds  Hematology   Anesthesia Other Findings   Reproductive/Obstetrics                             Anesthesia Physical Anesthesia Plan  ASA: III  Anesthesia Plan: MAC   Post-op Pain Management:    Induction: Intravenous  Airway Management Planned: Simple Face Mask  Additional Equipment:   Intra-op Plan:   Post-operative Plan: Extubation in OR  Informed Consent: I have reviewed the patients History and Physical, chart, labs and discussed the procedure including the risks, benefits and alternatives for the proposed anesthesia with the patient or authorized representative who has indicated his/her understanding and acceptance.     Plan Discussed with:   Anesthesia Plan Comments:         Anesthesia Quick Evaluation

## 2015-08-29 NOTE — Anesthesia Procedure Notes (Signed)
Procedure Name: MAC Date/Time: 08/29/2015 8:09 AM Performed by: Andree Elk, AMY A Pre-anesthesia Checklist: Timeout performed, Patient identified, Emergency Drugs available, Suction available and Patient being monitored Oxygen Delivery Method: Nasal cannula

## 2015-08-29 NOTE — Discharge Instructions (Signed)
°  °          [facility] Instructions °[address]- [place] [address]-[place] °    ° °1. Avoid closing eyes tightly. One often closes the eye tightly when laughing, talking, sneezing, coughing or if they feel irritated. At these times, you should be careful not to close your eyes tightly. ° °2. Instill eye drops as instructed. To instill drops in your eye, open it, look up and have someone gently pull the lower lid down and instill a couple of drops inside the lower lid. ° °3. Do not touch upper lid. ° °4. Take Advil or Tylenol for pain. ° °5. You may use either eye for near work, such as reading or sewing and you may watch television. ° °6. You may have your hair done at the beauty parlor at any time. ° °7. Wear dark glasses with or without your own glasses if you are in bright light. ° °8. Call our office at [phone] or [phone] if you have sharp pain in your eye or unusual symptoms. ° °9.  FOLLOW UP WITH DR. SHAPIRO TODAY IN HIS [place] OFFICE AT 2:45pm. ° °  °I have received a copy of the above instructions and will follow them.  ° ° ° °IF YOU ARE IN IMMEDIATE DANGER CALL 911! ° °It is important for you to keep your follow-up appointment with your physician after discharge, OR, for you /your caregiver to make a follow-up appointment with your physician / medical provider after discharge. ° °Show these instructions to the next healthcare provider you see. °PATIENT INSTRUCTIONS °POST-ANESTHESIA ° °IMMEDIATELY FOLLOWING SURGERY:  Do not drive or operate machinery for the first twenty four hours after surgery.  Do not make any important decisions for twenty four hours after surgery or while taking narcotic pain medications or sedatives.  If you develop intractable nausea and vomiting or a severe headache please notify your doctor immediately. ° °FOLLOW-UP:  Please make an appointment with your surgeon as instructed. You do not need to follow up with anesthesia unless  specifically instructed to do so. ° °WOUND CARE INSTRUCTIONS (if applicable):  Keep a dry clean dressing on the anesthesia/puncture wound site if there is drainage.  Once the wound has quit draining you may leave it open to air.  Generally you should leave the bandage intact for twenty four hours unless there is drainage.  If the epidural site drains for more than 36-48 hours please call the anesthesia department. ° °QUESTIONS?:  Please feel free to call your physician or the hospital operator if you have any questions, and they will be happy to assist you.    ° ° ° °

## 2015-08-29 NOTE — Op Note (Signed)
Patient brought to the operating room and prepped and draped in the usual manner.  Lid speculum inserted in left eye.  Stab incision made at the twelve o'clock position.  Provisc instilled in the anterior chamber.   A 2.4 mm. Stab incision was made temporally.  Intraocular Xylocaine instilled into the anterior chamber.  An anterior capsulotomy was done with a bent 25 gauge needle.  The nucleus was hydrodissected.  The Phaco tip was inserted in the anterior chamber and the nucleus was emulsified.  CDE was 4.11.  The cortical material was then removed with the I and A tip.  Posterior capsule was the polished.  The anterior chamber was deepened with Provisc.  A 21.0 Diopter Hoya Model 250 IOL was then inserted in the capsular bag.  Provisc was then removed with the I and A tip.  The wound was then hydrated.  Patient sent to the Recovery Room in good condition with follow up in my office.  Preoperative Diagnosis:  Nuclear Cataract OD Postoperative Diagnosis:  Same Procedure name: Kelman Phacoemulsification OD with IOL

## 2015-08-29 NOTE — Anesthesia Postprocedure Evaluation (Signed)
Anesthesia Post Note  Patient: Deborah Carroll  Procedure(s) Performed: Procedure(s) (LRB): CATARACT EXTRACTION PHACO AND INTRAOCULAR LENS PLACEMENT (IOC) (Left)  Patient location during evaluation: Short Stay Anesthesia Type: MAC Level of consciousness: awake and alert and oriented Pain management: pain level controlled Vital Signs Assessment: post-procedure vital signs reviewed and stable Respiratory status: spontaneous breathing Cardiovascular status: stable Postop Assessment: no signs of nausea or vomiting Anesthetic complications: no    Last Vitals:  Filed Vitals:   08/29/15 0750 08/29/15 0800  BP: 142/78 138/80  Temp: 36.7 C   Resp:  15    Last Pain: There were no vitals filed for this visit.               ADAMS, AMY A

## 2015-08-29 NOTE — H&P (Signed)
The patient was re examined and there is no change in the patients condition since the original H and P. 

## 2015-08-29 NOTE — Transfer of Care (Signed)
Immediate Anesthesia Transfer of Care Note  Patient: Deborah Carroll  Procedure(s) Performed: Procedure(s) with comments: CATARACT EXTRACTION PHACO AND INTRAOCULAR LENS PLACEMENT (IOC) (Left) - CDE: 4.11  Patient Location: Short Stay  Anesthesia Type:MAC  Level of Consciousness: awake, alert , oriented and patient cooperative  Airway & Oxygen Therapy: Patient Spontanous Breathing  Post-op Assessment: Report given to RN and Post -op Vital signs reviewed and stable  Post vital signs: Reviewed and stable.  Last Vitals:  Filed Vitals:   08/29/15 0750 08/29/15 0800  BP: 142/78 138/80  Temp: 36.7 C   Resp:  15    Last Pain: There were no vitals filed for this visit.    Patients Stated Pain Goal: 5 [id number]  Complications: No apparent anesthesia complications

## 2015-08-30 ENCOUNTER — Encounter: Payer: Self-pay | Admitting: Family Medicine

## 2015-08-30 ENCOUNTER — Ambulatory Visit (INDEPENDENT_AMBULATORY_CARE_PROVIDER_SITE_OTHER): Payer: PPO | Admitting: Family Medicine

## 2015-08-30 VITALS — BP 140/88 | Ht 62.0 in | Wt 246.2 lb

## 2015-08-30 DIAGNOSIS — F329 Major depressive disorder, single episode, unspecified: Secondary | ICD-10-CM | POA: Insufficient documentation

## 2015-08-30 DIAGNOSIS — E785 Hyperlipidemia, unspecified: Secondary | ICD-10-CM

## 2015-08-30 DIAGNOSIS — R569 Unspecified convulsions: Secondary | ICD-10-CM

## 2015-08-30 DIAGNOSIS — G4733 Obstructive sleep apnea (adult) (pediatric): Secondary | ICD-10-CM | POA: Diagnosis not present

## 2015-08-30 DIAGNOSIS — R7303 Prediabetes: Secondary | ICD-10-CM

## 2015-08-30 DIAGNOSIS — M549 Dorsalgia, unspecified: Secondary | ICD-10-CM | POA: Diagnosis not present

## 2015-08-30 DIAGNOSIS — G6289 Other specified polyneuropathies: Secondary | ICD-10-CM | POA: Diagnosis not present

## 2015-08-30 DIAGNOSIS — F322 Major depressive disorder, single episode, severe without psychotic features: Secondary | ICD-10-CM | POA: Diagnosis not present

## 2015-08-30 DIAGNOSIS — G8929 Other chronic pain: Secondary | ICD-10-CM

## 2015-08-30 DIAGNOSIS — Z79899 Other long term (current) drug therapy: Secondary | ICD-10-CM

## 2015-08-30 DIAGNOSIS — E119 Type 2 diabetes mellitus without complications: Secondary | ICD-10-CM | POA: Diagnosis not present

## 2015-08-30 MED ORDER — POTASSIUM CHLORIDE CRYS ER 10 MEQ PO TBCR: 10.0000 meq | EXTENDED_RELEASE_TABLET | Freq: Two times a day (BID) | ORAL | Status: DC

## 2015-08-30 MED ORDER — HYDROCODONE-ACETAMINOPHEN 10-325 MG PO TABS: 1.0000 | ORAL_TABLET | Freq: Three times a day (TID) | ORAL | Status: DC | PRN

## 2015-08-30 NOTE — Progress Notes (Signed)
Subjective:    Patient ID: Deborah Carroll, female    DOB: 09/03/1955, 60 y.o.   MRN: [id number]  HPI  This patient was seen today for chronic pain  The medication list was reviewed and updated.   -Compliance with medication: yes  - Number patient states they take daily: 1-2 tablets daily  -when was the last dose patient took? Last night  The patient was advised the importance of maintaining medication and not using illegal substances with these.  Refills needed: yes  The patient was educated that we can provide 3 monthly scripts for their medication, it is their responsibility to follow the instructions.  Side effects or complications from medications: none  Patient is aware that pain medications are meant to minimize the severity of the pain to allow their pain levels to improve to allow for better function. They are aware of that pain medications cannot totally remove their pain.  Due for UDT ( at least once per year) : next visit  Patient is having significant burning in her lower legs but she is concerned that Lyrica could be contributing to her depression  She is not suicidal she finds self down feeling sad blue frequently. Low energy. Low appetite. Not as sleeping as well as usual. Under a lot of stressors with her mother who has mental health issues and home issues Patient does try to watch his sugars in her diet but she has a history of prediabetes needs have lab work checked She has been significant weight gain she has considered surgery we talked at length about the importance of getting her weight down and the BMI closer to 30 or less she is looking into gastric surgery possibly gastric sleeve She states because of stopping the Lyrica she's had to take hydrocodone at least twice a day sometimes 3 times a day I told her that this seemed reasonable Patient has hyperlipidemia she does try to watch fats in her diet. We do need to check lab work regarding this. Greater than 25  minutes spent this patient discussing multiple different issues.      Review of Systems  Constitutional: Negative for fever and activity change.  HENT: Positive for congestion and rhinorrhea. Negative for ear pain.   Eyes: Negative for discharge.  Respiratory: Positive for cough. Negative for shortness of breath and wheezing.   Cardiovascular: Negative for chest pain.       Objective:   Physical Exam  Constitutional: She appears well-developed.  HENT:  Head: Normocephalic.  Nose: Nose normal.  Mouth/Throat: Oropharynx is clear and moist. No oropharyngeal exudate.  Neck: Neck supple.  Cardiovascular: Normal rate and normal heart sounds.   No murmur heard. Pulmonary/Chest: Effort normal and breath sounds normal. She has no wheezes.  Lymphadenopathy:    She has no cervical adenopathy.  Skin: Skin is warm and dry.  Nursing note and vitals reviewed.         Assessment & Plan:  Pain management-The patient was seen today as part of a comprehensive visit regarding pain control. Patient's compliance with the medication as well as discussion regarding effectiveness was completed. Prescriptions were written. Patient was advised to follow-up in 3 months. The patient was assessed for any signs of severe side effects. The patient was advised to take the medicine as directed and to report to [place] if any side effect issues.  Sleep apnea-she will try to get her updated sleep apparatus through her home health she may end up needing to have  a new sleep study she will let us know or the home health will let us know  Severe peripheral neuropathy. Patient unfortunately her insurance will not pay for Cymbalta. She feels the Lyrica could be contributing to her depression she is can stop the Lyrica she is using pain medicine to 3 times per day to help her tolerate the pain  Prediabetes she does need A1c  Seizures she does need Dilantin check Hyperlipidemia recent lab work reviewed [place] lab  work Patient recommended for counseling but she defers at this point follow-up in one month increase Celexa

## 2015-09-07 DIAGNOSIS — H35371 Puckering of macula, right eye: Secondary | ICD-10-CM | POA: Diagnosis not present

## 2015-09-07 DIAGNOSIS — H5315 Visual distortions of shape and size: Secondary | ICD-10-CM | POA: Diagnosis not present

## 2015-09-07 DIAGNOSIS — Z961 Presence of intraocular lens: Secondary | ICD-10-CM | POA: Diagnosis not present

## 2015-09-25 ENCOUNTER — Telehealth: Payer: Self-pay | Admitting: Family Medicine

## 2015-09-25 MED ORDER — CITALOPRAM HYDROBROMIDE 40 MG PO TABS: 40.0000 mg | ORAL_TABLET | Freq: Every day | ORAL | Status: DC

## 2015-09-25 NOTE — Telephone Encounter (Signed)
Left message on voicemail notifying patient that med was sent to pharmacy.  

## 2015-09-25 NOTE — Telephone Encounter (Signed)
Please increase to 40 mg daily., #30, 4 refills

## 2015-09-25 NOTE — Telephone Encounter (Signed)
Pt called stating that her citalopram was supposed to be increased to 40 mg. This is not documented on her medication list. Please advise.      Festus Barren

## 2015-10-04 ENCOUNTER — Ambulatory Visit: Payer: PPO | Admitting: Family Medicine

## 2015-10-12 ENCOUNTER — Ambulatory Visit: Payer: PPO | Admitting: Family Medicine

## 2015-11-03 DIAGNOSIS — G4733 Obstructive sleep apnea (adult) (pediatric): Secondary | ICD-10-CM | POA: Diagnosis not present

## 2015-11-06 DIAGNOSIS — H35371 Puckering of macula, right eye: Secondary | ICD-10-CM | POA: Diagnosis not present

## 2015-11-06 DIAGNOSIS — H5315 Visual distortions of shape and size: Secondary | ICD-10-CM | POA: Diagnosis not present

## 2015-11-06 DIAGNOSIS — Z961 Presence of intraocular lens: Secondary | ICD-10-CM | POA: Diagnosis not present

## 2015-11-22 ENCOUNTER — Other Ambulatory Visit: Payer: Self-pay | Admitting: Family Medicine

## 2015-12-08 DIAGNOSIS — H35363 Drusen (degenerative) of macula, bilateral: Secondary | ICD-10-CM | POA: Diagnosis not present

## 2015-12-08 DIAGNOSIS — H5315 Visual distortions of shape and size: Secondary | ICD-10-CM | POA: Diagnosis not present

## 2015-12-08 DIAGNOSIS — H35371 Puckering of macula, right eye: Secondary | ICD-10-CM | POA: Diagnosis not present

## 2015-12-26 DIAGNOSIS — Z961 Presence of intraocular lens: Secondary | ICD-10-CM | POA: Diagnosis not present

## 2016-01-30 ENCOUNTER — Ambulatory Visit (INDEPENDENT_AMBULATORY_CARE_PROVIDER_SITE_OTHER): Payer: PPO | Admitting: Family Medicine

## 2016-01-30 ENCOUNTER — Encounter: Payer: Self-pay | Admitting: Family Medicine

## 2016-01-30 VITALS — BP 142/90 | Ht 62.0 in | Wt 242.1 lb

## 2016-01-30 DIAGNOSIS — E7849 Other hyperlipidemia: Secondary | ICD-10-CM

## 2016-01-30 DIAGNOSIS — Z5181 Encounter for therapeutic drug level monitoring: Secondary | ICD-10-CM | POA: Diagnosis not present

## 2016-01-30 DIAGNOSIS — R600 Localized edema: Secondary | ICD-10-CM

## 2016-01-30 DIAGNOSIS — G6289 Other specified polyneuropathies: Secondary | ICD-10-CM | POA: Diagnosis not present

## 2016-01-30 DIAGNOSIS — E784 Other hyperlipidemia: Secondary | ICD-10-CM | POA: Diagnosis not present

## 2016-01-30 DIAGNOSIS — Z79899 Other long term (current) drug therapy: Secondary | ICD-10-CM | POA: Diagnosis not present

## 2016-01-30 DIAGNOSIS — R7303 Prediabetes: Secondary | ICD-10-CM | POA: Diagnosis not present

## 2016-01-30 LAB — POCT GLYCOSYLATED HEMOGLOBIN (HGB A1C): Hemoglobin A1C: 5.8

## 2016-01-30 MED ORDER — PANTOPRAZOLE SODIUM 40 MG PO TBEC: 40.0000 mg | DELAYED_RELEASE_TABLET | Freq: Every day | ORAL | 1 refills | Status: DC

## 2016-01-30 MED ORDER — HYDROCODONE-ACETAMINOPHEN 10-325 MG PO TABS: 1.0000 | ORAL_TABLET | Freq: Three times a day (TID) | ORAL | 0 refills | Status: DC | PRN

## 2016-01-30 MED ORDER — CITALOPRAM HYDROBROMIDE 40 MG PO TABS: 40.0000 mg | ORAL_TABLET | Freq: Every day | ORAL | 4 refills | Status: DC

## 2016-01-30 NOTE — Progress Notes (Signed)
Subjective:    Patient ID: Deborah Carroll, female    DOB: 02/01/1956, 60 y.o.   MRN: [id number]  HPI  This patient was seen today for chronic pain  The medication list was reviewed and updated.   -Compliance with medication: Takes as prescribed   - Number patient states they take daily: Patient states take 1-2 tablets daily.   -when was the last dose patient took? Last dose yesterday afternoon 1500   The patient was advised the importance of maintaining medication and not using illegal substances with these.  Refills needed: Yes   The patient was educated that we can provide 3 monthly scripts for their medication, it is their responsibility to follow the instructions.  Side effects or complications from medications: None   Patient is aware that pain medications are meant to minimize the severity of the pain to allow their pain levels to improve to allow for better function. They are aware of that pain medications cannot totally remove their pain.  Due for UDT ( at least once per year) : N/A  Patient states no other concerns this visit.     Results for orders placed or performed in visit on 01/30/16  POCT HgB A1C  Result Value Ref Range   Hemoglobin A1C 5.8     she does try to watch starches in her diet she is significantly overweight she has been counseled to excise lose weight she has significant neuropathy in the feet but she stopped taking Lyrica because it was causing her to have weight gain. She does have cholesterol issues previous labs reviewed with patient she also has a history of seizure issues she takes her Dilantin on a regular basis. She also has some intermittent pedal edema but denies any chest pressure tightness or pain but she is worried about the possibility CHF she denies PND denies dyspnea on exertion    Review of Systems  Constitutional: Negative for activity change, appetite change and fatigue.  HENT: Negative for congestion.   Respiratory: Negative  for cough.   Cardiovascular: Negative for chest pain.  Gastrointestinal: Negative for abdominal pain.  Endocrine: Negative for polydipsia and polyphagia.  Neurological: Negative for weakness.  Psychiatric/Behavioral: Negative for confusion.       Objective:   Physical Exam  Constitutional: She appears well-nourished. No distress.  Cardiovascular: Normal rate, regular rhythm and normal heart sounds.   No murmur heard. Pulmonary/Chest: Effort normal and breath sounds normal. No respiratory distress.  Musculoskeletal: She exhibits no edema.  Lymphadenopathy:    She has no cervical adenopathy.  Neurological: She is alert. She exhibits normal muscle tone.  Psychiatric: Her behavior is normal.  Vitals reviewed.   patient does have moderate neuropathy in both feet       Assessment & Plan:  Pedal edema she is worried about possible CHF there is no crackles no significant edema in the legs we will check a BNP she does get slightly short of breath with activity but she is also significantly overweight does not exercise she denies PND  Painful peripheral neuropathy does not want to take Lyrica anymore because weight gain uses pain medication 2-3 times per day needs her prescriptions. 3 prescriptions given  Borderline blood pressure the importance of watching diet exercising losing weight discuss if blood pressure still up on follow-up will need additional measures  Is on seizure medicine. Dilate level ordered.  History hyperlipidemia as well as prediabetes importance of diet exercise watching her weight and labs ordered previous  labs reviewed

## 2016-01-31 DIAGNOSIS — Z79899 Other long term (current) drug therapy: Secondary | ICD-10-CM | POA: Diagnosis not present

## 2016-01-31 DIAGNOSIS — R600 Localized edema: Secondary | ICD-10-CM | POA: Diagnosis not present

## 2016-01-31 DIAGNOSIS — Z5181 Encounter for therapeutic drug level monitoring: Secondary | ICD-10-CM | POA: Diagnosis not present

## 2016-01-31 DIAGNOSIS — E784 Other hyperlipidemia: Secondary | ICD-10-CM | POA: Diagnosis not present

## 2016-02-01 LAB — BASIC METABOLIC PANEL
BUN/Creatinine Ratio: 17 (ref 9–23)
BUN: 11 mg/dL (ref 6–24)
CO2: 26 mmol/L (ref 18–29)
Calcium: 9.1 mg/dL (ref 8.7–10.2)
Chloride: 101 mmol/L (ref 96–106)
Creatinine, Ser: 0.66 mg/dL (ref 0.57–1.00)
GFR calc Af Amer: 112 mL/min/{1.73_m2} (ref >59)
GFR calc non Af Amer: 97 mL/min/{1.73_m2} (ref >59)
Glucose: 118 mg/dL — ABNORMAL HIGH (ref 65–99)
Potassium: 4.5 mmol/L (ref 3.5–5.2)
Sodium: 143 mmol/L (ref 134–144)

## 2016-02-01 LAB — LIPID PANEL
Chol/HDL Ratio: 3.5 ratio units (ref 0.0–4.4)
Cholesterol, Total: 179 mg/dL (ref 100–199)
HDL: 51 mg/dL (ref >39)
LDL Calculated: 104 mg/dL — ABNORMAL HIGH (ref 0–99)
Triglycerides: 121 mg/dL (ref 0–149)
VLDL Cholesterol Cal: 24 mg/dL (ref 5–40)

## 2016-02-01 LAB — PHENYTOIN LEVEL, TOTAL: Phenytoin (Dilantin), Serum: 6 ug/mL — ABNORMAL LOW (ref 10.0–20.0)

## 2016-02-01 LAB — BRAIN NATRIURETIC PEPTIDE: BNP: 25.5 pg/mL (ref 0.0–100.0)

## 2016-02-05 DIAGNOSIS — G4733 Obstructive sleep apnea (adult) (pediatric): Secondary | ICD-10-CM | POA: Diagnosis not present

## 2016-02-28 NOTE — Addendum Note (Signed)
Addended by: Launa Grill on: 02/28/2016 01:39 PM   Modules accepted: Orders

## 2016-05-01 ENCOUNTER — Encounter: Payer: Self-pay | Admitting: Family Medicine

## 2016-05-01 ENCOUNTER — Ambulatory Visit (INDEPENDENT_AMBULATORY_CARE_PROVIDER_SITE_OTHER): Payer: PPO | Admitting: Family Medicine

## 2016-05-01 VITALS — BP 120/86 | Ht 62.0 in | Wt 234.4 lb

## 2016-05-01 DIAGNOSIS — Z79891 Long term (current) use of opiate analgesic: Secondary | ICD-10-CM

## 2016-05-01 MED ORDER — HYDROCODONE-ACETAMINOPHEN 10-325 MG PO TABS: 1.0000 | ORAL_TABLET | Freq: Three times a day (TID) | ORAL | 0 refills | Status: DC | PRN

## 2016-05-01 NOTE — Patient Instructions (Signed)

## 2016-05-01 NOTE — Progress Notes (Signed)
   Subjective:    Patient ID: Deborah Carroll, female    DOB: Sep 27, 1955, 61 y.o.   MRN: [id number]  HPI  This patient was seen today for chronic pain  The medication list was reviewed and updated.   -Compliance with medication: yes  - Number patient states they take daily:  1-2 daily   -when was the last dose patient took: yesterday morning  The patient was advised the importance of maintaining medication and not using illegal substances with these.  Refills needed: yes  The patient was educated that we can provide 3 monthly scripts for their medication, it is their responsibility to follow the instructions.  Side effects or complications from medications: none  Patient is aware that pain medications are meant to minimize the severity of the pain to allow their pain levels to improve to allow for better function. They are aware of that pain medications cannot totally remove their pain.  Due for UDT ( at least once per year) : yes  Patient has no concerns today.     Review of Systems  Constitutional: Negative for activity change and appetite change.  Gastrointestinal: Negative for abdominal pain and vomiting.  Neurological: Negative for weakness.  Psychiatric/Behavioral: Negative for confusion.       Objective:   Physical Exam  Constitutional: She appears well-nourished. No distress.  HENT:  Head: Normocephalic.  Cardiovascular: Normal rate, regular rhythm and normal heart sounds.   No murmur heard. Pulmonary/Chest: Effort normal and breath sounds normal.  Musculoskeletal: She exhibits no edema.  Lymphadenopathy:    She has no cervical adenopathy.  Neurological: She is alert.  Psychiatric: Her behavior is normal.  Vitals reviewed.         Assessment & Plan:  The patient was seen today as part of a comprehensive visit regarding pain control. Patient's compliance with the medication as well as discussion regarding effectiveness was completed. Prescriptions were  written. Patient was advised to follow-up in 3 months. The patient was assessed for any signs of severe side effects. The patient was advised to take the medicine as directed and to report to [place] if any side effect issues. Your urine drug screen taken today Contract reviewed State Registry checked 3 prescriptions given Comprehensive lab work on next visit

## 2016-05-07 DIAGNOSIS — G4733 Obstructive sleep apnea (adult) (pediatric): Secondary | ICD-10-CM | POA: Diagnosis not present

## 2016-05-07 LAB — TOXASSURE SELECT 13 (MW), URINE

## 2016-06-05 DIAGNOSIS — H35341 Macular cyst, hole, or pseudohole, right eye: Secondary | ICD-10-CM | POA: Diagnosis not present

## 2016-07-31 ENCOUNTER — Ambulatory Visit: Payer: PPO | Admitting: Family Medicine

## 2016-08-15 DIAGNOSIS — G4733 Obstructive sleep apnea (adult) (pediatric): Secondary | ICD-10-CM | POA: Diagnosis not present

## 2016-09-12 ENCOUNTER — Other Ambulatory Visit: Payer: Self-pay | Admitting: Family Medicine

## 2016-09-12 NOTE — Telephone Encounter (Signed)
Last seen 05/01/16

## 2016-09-13 NOTE — Telephone Encounter (Signed)
30 d worth, sched six mo ck up

## 2016-09-24 DIAGNOSIS — H35371 Puckering of macula, right eye: Secondary | ICD-10-CM | POA: Diagnosis not present

## 2016-09-24 DIAGNOSIS — H35341 Macular cyst, hole, or pseudohole, right eye: Secondary | ICD-10-CM | POA: Diagnosis not present

## 2016-09-24 DIAGNOSIS — H35342 Macular cyst, hole, or pseudohole, left eye: Secondary | ICD-10-CM | POA: Diagnosis not present

## 2016-09-24 DIAGNOSIS — H5319 Other subjective visual disturbances: Secondary | ICD-10-CM | POA: Diagnosis not present

## 2016-09-24 DIAGNOSIS — H53412 Scotoma involving central area, left eye: Secondary | ICD-10-CM | POA: Diagnosis not present

## 2016-09-24 DIAGNOSIS — H35363 Drusen (degenerative) of macula, bilateral: Secondary | ICD-10-CM | POA: Diagnosis not present

## 2016-09-24 DIAGNOSIS — Z961 Presence of intraocular lens: Secondary | ICD-10-CM | POA: Diagnosis not present

## 2016-10-14 ENCOUNTER — Encounter: Payer: Self-pay | Admitting: Family Medicine

## 2016-10-14 ENCOUNTER — Ambulatory Visit (INDEPENDENT_AMBULATORY_CARE_PROVIDER_SITE_OTHER): Payer: PPO | Admitting: Family Medicine

## 2016-10-14 VITALS — BP 126/82 | Ht 62.0 in | Wt 224.6 lb

## 2016-10-14 DIAGNOSIS — Z114 Encounter for screening for human immunodeficiency virus [HIV]: Secondary | ICD-10-CM

## 2016-10-14 DIAGNOSIS — Z1159 Encounter for screening for other viral diseases: Secondary | ICD-10-CM

## 2016-10-14 DIAGNOSIS — G6289 Other specified polyneuropathies: Secondary | ICD-10-CM

## 2016-10-14 DIAGNOSIS — R569 Unspecified convulsions: Secondary | ICD-10-CM

## 2016-10-14 MED ORDER — DULOXETINE HCL 30 MG PO CPEP: 30.0000 mg | ORAL_CAPSULE | Freq: Every day | ORAL | 6 refills | Status: DC

## 2016-10-14 MED ORDER — CITALOPRAM HYDROBROMIDE 40 MG PO TABS: ORAL_TABLET | ORAL | 6 refills | Status: DC

## 2016-10-14 MED ORDER — HYDROCODONE-ACETAMINOPHEN 10-325 MG PO TABS: 1.0000 | ORAL_TABLET | Freq: Three times a day (TID) | ORAL | 0 refills | Status: DC | PRN

## 2016-10-14 MED ORDER — PANTOPRAZOLE SODIUM 40 MG PO TBEC: 40.0000 mg | DELAYED_RELEASE_TABLET | Freq: Every day | ORAL | 1 refills | Status: DC

## 2016-10-14 NOTE — Progress Notes (Signed)
   Subjective:    Patient ID: Deborah Carroll, female    DOB: 28-Oct-1955, 61 y.o.   MRN: [id number]  HPI  This patient was seen today for chronic pain  The medication list was reviewed and updated.   -Compliance with medication: yes  - Number patient states they take daily: 1-2 a day  -when was the last dose patient took? Last night  The patient was advised the importance of maintaining medication and not using illegal substances with these.  Refills needed: yes  The patient was educated that we can provide 3 monthly scripts for their medication, it is their responsibility to follow the instructions.  Side effects or complications from medications: none  Patient is aware that pain medications are meant to minimize the severity of the pain to allow their pain levels to improve to allow for better function. They are aware of that pain medications cannot totally remove their pain.  Due for UDT ( at least once per year) : 04/2016  She states occasional swelling he occasionally has to use diuretic and potassium tablet She states pain medicine does good job for her neuropathy takes 2 or 3 per day She would like to consider other medications for neuropathy we had long discussion today including Cymbalta She states no seizures recently takes her medicine as directed.     Review of Systems  Constitutional: Negative for activity change and appetite change.  Gastrointestinal: Negative for abdominal pain and vomiting.  Musculoskeletal: Positive for arthralgias.  Neurological: Negative for weakness.  Psychiatric/Behavioral: Negative for confusion.       Objective:   Physical Exam  Constitutional: She appears well-nourished. No distress.  HENT:  Head: Normocephalic.  Cardiovascular: Normal rate, regular rhythm and normal heart sounds.   No murmur heard. Pulmonary/Chest: Effort normal and breath sounds normal.  Musculoskeletal: She exhibits no edema.  Lymphadenopathy:    She has no  cervical adenopathy.  Neurological: She is alert.  Psychiatric: Her behavior is normal.  Vitals reviewed.   Patient has dense neuropathy in the feet.  25 minutes was spent with the patient. Greater than half the time was spent in discussion and answering questions and counseling regarding the issues that the patient came in for today.     Assessment & Plan:  Severe neuropathy in the feet along with pain Patient would like to try Cymbalta to see how this does for her She will taper off Celexa She will give [place] feedback in one month how it is doing may need to go up on Cymbalta  Chronic pain discomfort related to the neuropathy and to back pain drug registry was checked she was encouraged to not exceed more than 3 in a day 3 prescriptions given she will follow-up in approximate 3 months  Seizure condition does not have any seizures currently does well on Dilantin we will check lab work  Liver profile is due  Hep C HIV screening recommended  Pedal edema under good control

## 2016-10-15 DIAGNOSIS — H35342 Macular cyst, hole, or pseudohole, left eye: Secondary | ICD-10-CM | POA: Diagnosis not present

## 2016-10-15 DIAGNOSIS — H33022 Retinal detachment with multiple breaks, left eye: Secondary | ICD-10-CM | POA: Diagnosis not present

## 2016-10-15 DIAGNOSIS — H33312 Horseshoe tear of retina without detachment, left eye: Secondary | ICD-10-CM | POA: Diagnosis not present

## 2016-10-15 LAB — PHENYTOIN LEVEL, TOTAL: Phenytoin (Dilantin), Serum: 3 ug/mL — ABNORMAL LOW (ref 10.0–20.0)

## 2016-10-15 LAB — HEPATITIS C ANTIBODY: Hep C Virus Ab: <0.1 s/co ratio (ref 0.0–0.9)

## 2016-10-15 LAB — HEPATIC FUNCTION PANEL
ALT: 13 IU/L (ref 0–32)
AST: 15 IU/L (ref 0–40)
Albumin: 4.1 g/dL (ref 3.5–5.5)
Alkaline Phosphatase: 131 IU/L — ABNORMAL HIGH (ref 39–117)
Bilirubin Total: <0.2 mg/dL (ref 0.0–1.2)
Bilirubin, Direct: 0.08 mg/dL (ref 0.00–0.40)
Total Protein: 6.5 g/dL (ref 6.0–8.5)

## 2016-10-15 LAB — HIV ANTIBODY (ROUTINE TESTING W REFLEX): HIV Screen 4th Generation wRfx: NONREACTIVE

## 2016-10-22 ENCOUNTER — Ambulatory Visit: Payer: PPO | Admitting: Family Medicine

## 2016-11-27 ENCOUNTER — Other Ambulatory Visit: Payer: Self-pay

## 2016-11-27 ENCOUNTER — Telehealth: Payer: Self-pay | Admitting: Family Medicine

## 2016-11-27 MED ORDER — TORSEMIDE 20 MG PO TABS: ORAL_TABLET | ORAL | 0 refills | Status: DC

## 2016-11-27 NOTE — Telephone Encounter (Signed)
Patient is requesting a refill on her fluid pill.  She thinks it is torsemide, but wants a nurse to check and make sure.  [facility]

## 2016-11-27 NOTE — Telephone Encounter (Signed)
Spoke with patient and informed her that refills was sent in torsemide. Patient verbalized understanding.

## 2016-11-30 ENCOUNTER — Other Ambulatory Visit: Payer: Self-pay | Admitting: Family Medicine

## 2016-12-02 DIAGNOSIS — G4733 Obstructive sleep apnea (adult) (pediatric): Secondary | ICD-10-CM | POA: Diagnosis not present

## 2016-12-04 NOTE — Telephone Encounter (Signed)
Please verify the dose with the patient if it is accurate please refill it for 6 months

## 2017-02-25 ENCOUNTER — Encounter: Payer: Self-pay | Admitting: Family Medicine

## 2017-02-25 ENCOUNTER — Ambulatory Visit: Payer: PPO | Admitting: Family Medicine

## 2017-02-25 VITALS — BP 154/98 | Ht 62.0 in | Wt 241.1 lb

## 2017-02-25 DIAGNOSIS — R7303 Prediabetes: Secondary | ICD-10-CM | POA: Diagnosis not present

## 2017-02-25 DIAGNOSIS — R030 Elevated blood-pressure reading, without diagnosis of hypertension: Secondary | ICD-10-CM | POA: Diagnosis not present

## 2017-02-25 DIAGNOSIS — G8929 Other chronic pain: Secondary | ICD-10-CM

## 2017-02-25 DIAGNOSIS — F320 Major depressive disorder, single episode, mild: Secondary | ICD-10-CM | POA: Diagnosis not present

## 2017-02-25 DIAGNOSIS — Z79899 Other long term (current) drug therapy: Secondary | ICD-10-CM

## 2017-02-25 DIAGNOSIS — Z5181 Encounter for therapeutic drug level monitoring: Secondary | ICD-10-CM

## 2017-02-25 DIAGNOSIS — M545 Low back pain, unspecified: Secondary | ICD-10-CM

## 2017-02-25 DIAGNOSIS — G4733 Obstructive sleep apnea (adult) (pediatric): Secondary | ICD-10-CM | POA: Diagnosis not present

## 2017-02-25 DIAGNOSIS — R569 Unspecified convulsions: Secondary | ICD-10-CM | POA: Diagnosis not present

## 2017-02-25 LAB — POCT GLYCOSYLATED HEMOGLOBIN (HGB A1C): Hemoglobin A1C: 5.7

## 2017-02-25 MED ORDER — HYDROCODONE-ACETAMINOPHEN 10-325 MG PO TABS: 1.0000 | ORAL_TABLET | Freq: Three times a day (TID) | ORAL | 0 refills | Status: DC | PRN

## 2017-02-25 MED ORDER — TORSEMIDE 20 MG PO TABS: ORAL_TABLET | ORAL | 2 refills | Status: DC

## 2017-02-25 MED ORDER — PANTOPRAZOLE SODIUM 40 MG PO TBEC: 40.0000 mg | DELAYED_RELEASE_TABLET | Freq: Every day | ORAL | 1 refills | Status: DC

## 2017-02-25 MED ORDER — DULOXETINE HCL 60 MG PO CPEP: 60.0000 mg | ORAL_CAPSULE | Freq: Every day | ORAL | 2 refills | Status: DC

## 2017-02-25 NOTE — Patient Instructions (Addendum)
DASH Eating Plan DASH stands for "Dietary Approaches to Stop Hypertension." The DASH eating plan is a healthy eating plan that has been shown to reduce high blood pressure (hypertension). It may also reduce your risk for type 2 diabetes, heart disease, and stroke. The DASH eating plan may also help with weight loss. What are tips for following this plan? General guidelines  Avoid eating more than 2,300 mg (milligrams) of salt (sodium) a day. If you have hypertension, you may need to reduce your sodium intake to 1,500 mg a day.  Limit alcohol intake to no more than 1 drink a day for nonpregnant women and 2 drinks a day for men. One drink equals 12 oz of beer, 5 oz of wine, or 1 oz of hard liquor.  Work with your health care provider to maintain a healthy body weight or to lose weight. Ask what an ideal weight is for you.  Get at least 30 minutes of exercise that causes your heart to beat faster (aerobic exercise) most days of the week. Activities may include walking, swimming, or biking.  Work with your health care provider or diet and nutrition specialist (dietitian) to adjust your eating plan to your individual calorie needs. Reading food labels  Check food labels for the amount of sodium per serving. Choose foods with less than 5 percent of the Daily Value of sodium. Generally, foods with less than 300 mg of sodium per serving fit into this eating plan.  To find whole grains, look for the word "whole" as the first word in the ingredient list. Shopping  Buy products labeled as "low-sodium" or "no salt added."  Buy fresh foods. Avoid canned foods and premade or frozen meals. Cooking  Avoid adding salt when cooking. Use salt-free seasonings or herbs instead of table salt or sea salt. Check with your health care provider or pharmacist before using salt substitutes.  Do not fry foods. Cook foods using healthy methods such as baking, boiling, grilling, and broiling instead.  Cook with  heart-healthy oils, such as olive, canola, soybean, or sunflower oil. Meal planning   Eat a balanced diet that includes: ? 5 or more servings of fruits and vegetables each day. At each meal, try to fill half of your plate with fruits and vegetables. ? Up to 6-8 servings of whole grains each day. ? Less than 6 oz of lean meat, poultry, or fish each day. A 3-oz serving of meat is about the same size as a deck of cards. One egg equals 1 oz. ? 2 servings of low-fat dairy each day. ? A serving of nuts, seeds, or beans 5 times each week. ? Heart-healthy fats. Healthy fats called Omega-3 fatty acids are found in foods such as flaxseeds and coldwater fish, like sardines, salmon, and mackerel.  Limit how much you eat of the following: ? Canned or prepackaged foods. ? Food that is high in trans fat, such as fried foods. ? Food that is high in saturated fat, such as fatty meat. ? Sweets, desserts, sugary drinks, and other foods with added sugar. ? Full-fat dairy products.  Do not salt foods before eating.  Try to eat at least 2 vegetarian meals each week.  Eat more home-cooked food and less restaurant, buffet, and fast food.  When eating at a restaurant, ask that your food be prepared with less salt or no salt, if possible. What foods are recommended? The items listed may not be a complete list. Talk with your dietitian about what   dietary choices are best for you. Grains Whole-grain or whole-wheat bread. Whole-grain or whole-wheat pasta. Brown rice. Oatmeal. Quinoa. Bulgur. Whole-grain and low-sodium cereals. Pita bread. Low-fat, low-sodium crackers. Whole-wheat flour tortillas. Vegetables Fresh or frozen vegetables (raw, steamed, roasted, or grilled). Low-sodium or reduced-sodium tomato and vegetable juice. Low-sodium or reduced-sodium tomato sauce and tomato paste. Low-sodium or reduced-sodium canned vegetables. Fruits All fresh, dried, or frozen fruit. Canned fruit in natural juice (without  added sugar). Meat and other protein foods Skinless chicken or turkey. Ground chicken or turkey. Pork with fat trimmed off. Fish and seafood. Egg whites. Dried beans, peas, or lentils. Unsalted nuts, nut butters, and seeds. Unsalted canned beans. Lean cuts of beef with fat trimmed off. Low-sodium, lean deli meat. Dairy Low-fat (1%) or fat-free (skim) milk. Fat-free, low-fat, or reduced-fat cheeses. Nonfat, low-sodium ricotta or cottage cheese. Low-fat or nonfat yogurt. Low-fat, low-sodium cheese. Fats and oils Soft margarine without trans fats. Vegetable oil. Low-fat, reduced-fat, or light mayonnaise and salad dressings (reduced-sodium). Canola, safflower, olive, soybean, and sunflower oils. Avocado. Seasoning and other foods Herbs. Spices. Seasoning mixes without salt. Unsalted popcorn and pretzels. Fat-free sweets. What foods are not recommended? The items listed may not be a complete list. Talk with your dietitian about what dietary choices are best for you. Grains Baked goods made with fat, such as croissants, muffins, or some breads. Dry pasta or rice meal packs. Vegetables Creamed or fried vegetables. Vegetables in a cheese sauce. Regular canned vegetables (not low-sodium or reduced-sodium). Regular canned tomato sauce and paste (not low-sodium or reduced-sodium). Regular tomato and vegetable juice (not low-sodium or reduced-sodium). Pickles. Olives. Fruits Canned fruit in a light or heavy syrup. Fried fruit. Fruit in cream or butter sauce. Meat and other protein foods Fatty cuts of meat. Ribs. Fried meat. Bacon. Sausage. Bologna and other processed lunch meats. Salami. Fatback. Hotdogs. Bratwurst. Salted nuts and seeds. Canned beans with added salt. Canned or smoked fish. Whole eggs or egg yolks. Chicken or turkey with skin. Dairy Whole or 2% milk, cream, and half-and-half. Whole or full-fat cream cheese. Whole-fat or sweetened yogurt. Full-fat cheese. Nondairy creamers. Whipped toppings.  Processed cheese and cheese spreads. Fats and oils Butter. Stick margarine. Lard. Shortening. Ghee. Bacon fat. Tropical oils, such as coconut, palm kernel, or palm oil. Seasoning and other foods Salted popcorn and pretzels. Onion salt, garlic salt, seasoned salt, table salt, and sea salt. Worcestershire sauce. Tartar sauce. Barbecue sauce. Teriyaki sauce. Soy sauce, including reduced-sodium. Steak sauce. Canned and packaged gravies. Fish sauce. Oyster sauce. Cocktail sauce. Horseradish that you find on the shelf. Ketchup. Mustard. Meat flavorings and tenderizers. Bouillon cubes. Hot sauce and Tabasco sauce. Premade or packaged marinades. Premade or packaged taco seasonings. Relishes. Regular salad dressings. Where to find more information:  National Heart, Lung, and Blood Institute: www.nhlbi.nih.gov  American Heart Association: www.heart.org Summary  The DASH eating plan is a healthy eating plan that has been shown to reduce high blood pressure (hypertension). It may also reduce your risk for type 2 diabetes, heart disease, and stroke.  With the DASH eating plan, you should limit salt (sodium) intake to 2,300 mg a day. If you have hypertension, you may need to reduce your sodium intake to 1,500 mg a day.  When on the DASH eating plan, aim to eat more fresh fruits and vegetables, whole grains, lean proteins, low-fat dairy, and heart-healthy fats.  Work with your health care provider or diet and nutrition specialist (dietitian) to adjust your eating plan to your individual   calorie needs. This information is not intended to replace advice given to you by your health care provider. Make sure you discuss any questions you have with your health care provider. Document Released: 03/14/2011 Document Revised: 03/18/2016 Document Reviewed: 03/18/2016 Elsevier Interactive Patient Education  2017 Elsevier Inc. Diabetes Mellitus and Food It is important for you to manage your blood sugar (glucose) level.  Your blood glucose level can be greatly affected by what you eat. Eating healthier foods in the appropriate amounts throughout the day at about the same time each day will help you control your blood glucose level. It can also help slow or prevent worsening of your diabetes mellitus. Healthy eating may even help you improve the level of your blood pressure and reach or maintain a healthy weight. General recommendations for healthful eating and cooking habits include:  Eating meals and snacks regularly. Avoid going long periods of time without eating to lose weight.  Eating a diet that consists mainly of plant-based foods, such as fruits, vegetables, nuts, legumes, and whole grains.  Using low-heat cooking methods, such as baking, instead of high-heat cooking methods, such as deep frying.  Work with your dietitian to make sure you understand how to use the Nutrition Facts information on food labels. How can food affect me? Carbohydrates Carbohydrates affect your blood glucose level more than any other type of food. Your dietitian will help you determine how many carbohydrates to eat at each meal and teach you how to count carbohydrates. Counting carbohydrates is important to keep your blood glucose at a healthy level, especially if you are using insulin or taking certain medicines for diabetes mellitus. Alcohol Alcohol can cause sudden decreases in blood glucose (hypoglycemia), especially if you use insulin or take certain medicines for diabetes mellitus. Hypoglycemia can be a life-threatening condition. Symptoms of hypoglycemia (sleepiness, dizziness, and disorientation) are similar to symptoms of having too much alcohol. If your health care provider has given you approval to drink alcohol, do so in moderation and use the following guidelines:  Women should not have more than one drink per day, and men should not have more than two drinks per day. One drink is equal to: ? 12 oz of beer. ? 5 oz of  wine. ? 1 oz of hard liquor.  Do not drink on an empty stomach.  Keep yourself hydrated. Have water, diet soda, or unsweetened iced tea.  Regular soda, juice, and other mixers might contain a lot of carbohydrates and should be counted.  What foods are not recommended? As you make food choices, it is important to remember that all foods are not the same. Some foods have fewer nutrients per serving than other foods, even though they might have the same number of calories or carbohydrates. It is difficult to get your body what it needs when you eat foods with fewer nutrients. Examples of foods that you should avoid that are high in calories and carbohydrates but low in nutrients include:  Trans fats (most processed foods list trans fats on the Nutrition Facts label).  Regular soda.  Juice.  Candy.  Sweets, such as cake, pie, doughnuts, and cookies.  Fried foods.  What foods can I eat? Eat nutrient-rich foods, which will nourish your body and keep you healthy. The food you should eat also will depend on several factors, including:  The calories you need.  The medicines you take.  Your weight.  Your blood glucose level.  Your blood pressure level.  Your cholesterol level.    You should eat a variety of foods, including:  Protein. ? Lean cuts of meat. ? Proteins low in saturated fats, such as fish, egg whites, and beans. Avoid processed meats.  Fruits and vegetables. ? Fruits and vegetables that may help control blood glucose levels, such as apples, mangoes, and yams.  Dairy products. ? Choose fat-free or low-fat dairy products, such as milk, yogurt, and cheese.  Grains, bread, pasta, and rice. ? Choose whole grain products, such as multigrain bread, whole oats, and brown rice. These foods may help control blood pressure.  Fats. ? Foods containing healthful fats, such as nuts, avocado, olive oil, canola oil, and fish.  Does everyone with diabetes mellitus have the  same meal plan? Because every person with diabetes mellitus is different, there is not one meal plan that works for everyone. It is very important that you meet with a dietitian who will help you create a meal plan that is just right for you. This information is not intended to replace advice given to you by your health care provider. Make sure you discuss any questions you have with your health care provider. Document Released: 12/20/2004 Document Revised: 08/31/2015 Document Reviewed: 02/19/2013 Elsevier Interactive Patient Education  2017 Elsevier Inc.  

## 2017-02-25 NOTE — Progress Notes (Signed)
Subjective:    Patient ID: Deborah Carroll, female    DOB: Mar 03, 1956, 61 y.o.   MRN: [id number]  HPI This patient was seen today for chronic pain  The medication list was reviewed and updated.   -Compliance with medication: Takes daily   - Number patient states they take daily:  Takes 1-2 daily   -when was the last dose patient took?  Last dose two nights ago  The patient was advised the importance of maintaining medication and not using illegal substances with these.  Refills needed: yes   The patient was educated that we can provide 3 monthly scripts for their medication, it is their responsibility to follow the instructions.  Side effects or complications from medications: none   Patient is aware that pain medications are meant to minimize the severity of the pain to allow their pain levels to improve to allow for better function. They are aware of that pain medications cannot totally remove their pain.  Due for UDT ( at least once per year) : 05/01/2017  States no other concerns this visit.   Results for orders placed or performed in visit on 02/25/17  POCT HgB A1C  Result Value Ref Range   Hemoglobin A1C 5.7    Patient has prediabetes she does try to watch starches in her diet to some degree but could do much better She also has elevated blood pressure today states she has been consuming more salt than usual Patient does not exercise much because of chronic pain in her legs and neuropathy in her legs as well as chronic back pain She does have sleep apnea she uses a machine it does help her with daytime fatigue She does relate some mild depression denies being suicidal states Cymbalta helping but she would like to try higher dose  25 minutes was spent with the patient. Greater than half the time was spent in discussion and answering questions and counseling regarding the issues that the patient came in for today.      Review of Systems  Constitutional: Negative for  activity change, appetite change and fatigue.  HENT: Negative for congestion.   Respiratory: Negative for cough.   Cardiovascular: Negative for chest pain.  Gastrointestinal: Negative for abdominal pain.  Endocrine: Negative for polydipsia and polyphagia.  Skin: Negative for color change.  Neurological: Negative for weakness.  Psychiatric/Behavioral: Negative for confusion.       Objective:   Physical Exam  Constitutional: She appears well-developed and well-nourished. No distress.  HENT:  Head: Normocephalic and atraumatic.  Eyes: Right eye exhibits no discharge. Left eye exhibits no discharge.  Neck: No tracheal deviation present.  Cardiovascular: Normal rate, regular rhythm and normal heart sounds.  No murmur heard. Pulmonary/Chest: Effort normal and breath sounds normal. No respiratory distress. She has no wheezes. She has no rales.  Musculoskeletal: She exhibits no edema.  Lymphadenopathy:    She has no cervical adenopathy.  Neurological: She is alert. She exhibits normal muscle tone.  Skin: Skin is warm and dry. No erythema.  Psychiatric: Her behavior is normal.  Vitals reviewed.         Assessment & Plan:  Prediabetes under good control patient really needs to try to lose weight she needs to watch portions watch how she eats plus also increase physical activity  Elevated blood pressure patient needs to watch how she eats cut back on salt lose weight exercise more recheck blood pressure in 6 weeks time may need medication  Seizure  disorder check Dilantin level no seizures recently  Depression patient states doing fairly well with medication but she would like to try higher dose of Cymbalta to see if this would help therefore increase to 60 mg if she figures or has any problems she is to stop this  Sleep apnea using her CPAP machine continue with this  Chronic pain and discomfort The patient was seen today as part of a comprehensive visit regarding pain control.  Patient's compliance with the medication as well as discussion regarding effectiveness was completed. Prescriptions were written. Patient was advised to follow-up in 3 months. The patient was assessed for any signs of severe side effects. The patient was advised to take the medicine as directed and to report to [place] if any side effect issues. Drug registry was checked patient does not abuse medicine maximum is 3 and a day some days she only takes 1 or 2

## 2017-02-26 LAB — LIPID PANEL
Chol/HDL Ratio: 3.4 ratio (ref 0.0–4.4)
Cholesterol, Total: 185 mg/dL (ref 100–199)
HDL: 55 mg/dL (ref >39)
LDL Calculated: 102 mg/dL — ABNORMAL HIGH (ref 0–99)
Triglycerides: 141 mg/dL (ref 0–149)
VLDL Cholesterol Cal: 28 mg/dL (ref 5–40)

## 2017-02-26 LAB — PHENYTOIN LEVEL, TOTAL: Phenytoin (Dilantin), Serum: 5.3 ug/mL — ABNORMAL LOW (ref 10.0–20.0)

## 2017-02-26 LAB — BASIC METABOLIC PANEL
BUN/Creatinine Ratio: 11 — ABNORMAL LOW (ref 12–28)
BUN: 8 mg/dL (ref 8–27)
CO2: 24 mmol/L (ref 20–29)
Calcium: 9.1 mg/dL (ref 8.7–10.3)
Chloride: 101 mmol/L (ref 96–106)
Creatinine, Ser: 0.7 mg/dL (ref 0.57–1.00)
GFR calc Af Amer: 109 mL/min/{1.73_m2} (ref >59)
GFR calc non Af Amer: 94 mL/min/{1.73_m2} (ref >59)
Glucose: 94 mg/dL (ref 65–99)
Potassium: 3.8 mmol/L (ref 3.5–5.2)
Sodium: 142 mmol/L (ref 134–144)

## 2017-03-09 ENCOUNTER — Other Ambulatory Visit: Payer: Self-pay | Admitting: Family Medicine

## 2017-03-10 NOTE — Telephone Encounter (Signed)
Patient last seen 02/2017

## 2017-03-20 DIAGNOSIS — G4733 Obstructive sleep apnea (adult) (pediatric): Secondary | ICD-10-CM | POA: Diagnosis not present

## 2017-03-24 NOTE — Addendum Note (Signed)
Addended by: Dairl Ponder on: 03/24/2017 09:37 AM   Modules accepted: Orders

## 2017-04-15 ENCOUNTER — Ambulatory Visit: Payer: PPO | Admitting: Family Medicine

## 2017-05-15 ENCOUNTER — Telehealth: Payer: Self-pay | Admitting: Family Medicine

## 2017-05-15 DIAGNOSIS — H6120 Impacted cerumen, unspecified ear: Secondary | ICD-10-CM

## 2017-05-15 NOTE — Telephone Encounter (Signed)
Pt is needing a referral to get her ears flushed. Please advise.

## 2017-05-15 NOTE — Telephone Encounter (Signed)
Referral to ENT °

## 2017-05-15 NOTE — Telephone Encounter (Signed)
Referral put in. Left message for pt to return call to notify her

## 2017-05-16 NOTE — Telephone Encounter (Signed)
Pt is aware.  

## 2017-05-26 ENCOUNTER — Encounter: Payer: Self-pay | Admitting: Family Medicine

## 2017-05-26 ENCOUNTER — Telehealth: Payer: Self-pay | Admitting: Family Medicine

## 2017-05-26 NOTE — Telephone Encounter (Signed)
Patient called to check on referral to ENT.

## 2017-05-26 NOTE — Telephone Encounter (Signed)
Referral sent, pt aware - Dr. Deeann Saint office will contact her directly to schedule

## 2017-06-10 DIAGNOSIS — H6122 Impacted cerumen, left ear: Secondary | ICD-10-CM | POA: Diagnosis not present

## 2017-06-10 DIAGNOSIS — H903 Sensorineural hearing loss, bilateral: Secondary | ICD-10-CM | POA: Diagnosis not present

## 2017-06-12 ENCOUNTER — Telehealth: Payer: Self-pay | Admitting: Family Medicine

## 2017-06-12 NOTE — Telephone Encounter (Signed)
Patient dropped off handicap placard needing to be signed.  See in yellow basket.

## 2017-06-12 NOTE — Telephone Encounter (Signed)
This was filled out 

## 2017-07-10 DIAGNOSIS — G4733 Obstructive sleep apnea (adult) (pediatric): Secondary | ICD-10-CM | POA: Diagnosis not present

## 2017-07-23 ENCOUNTER — Telehealth: Payer: Self-pay | Admitting: Family Medicine

## 2017-07-23 NOTE — Telephone Encounter (Signed)
Patient is requesting a referral to Dr. Olevia Perches office for a colonoscopy.

## 2017-07-23 NOTE — Telephone Encounter (Signed)
Yes o v this wk

## 2017-07-23 NOTE — Telephone Encounter (Signed)
Pt states stool has not been normal for the past month. Stool is mushy or either having constipation. Having pain in bottom of rectum. No blood in stool that she has noticed. No fever. Stomach bloats 2 -3 times a week.  Pt states last colonoscopy was 5 years or more with dr fields but pt would like to see dr Laural Golden. Advised pt she needs appt here first because it can take several weeks to get in with specialist. Can pt have same day appt this week or can it wait til next week.

## 2017-07-23 NOTE — Telephone Encounter (Signed)
Called pt and she wanted to see Deborah Carroll. Hoyle Sauer out of office tomorrow. appt given Friday morning with Deborah Carroll.

## 2017-07-25 ENCOUNTER — Encounter: Payer: Self-pay | Admitting: Nurse Practitioner

## 2017-07-25 ENCOUNTER — Ambulatory Visit (INDEPENDENT_AMBULATORY_CARE_PROVIDER_SITE_OTHER): Payer: PPO | Admitting: Nurse Practitioner

## 2017-07-25 VITALS — BP 138/86 | Ht 62.0 in | Wt 242.2 lb

## 2017-07-25 DIAGNOSIS — K644 Residual hemorrhoidal skin tags: Secondary | ICD-10-CM

## 2017-07-25 DIAGNOSIS — K219 Gastro-esophageal reflux disease without esophagitis: Secondary | ICD-10-CM | POA: Diagnosis not present

## 2017-07-25 DIAGNOSIS — R198 Other specified symptoms and signs involving the digestive system and abdomen: Secondary | ICD-10-CM

## 2017-07-25 DIAGNOSIS — K582 Mixed irritable bowel syndrome: Secondary | ICD-10-CM

## 2017-07-25 LAB — POCT HEMOGLOBIN: Hemoglobin: 13.7 g/dL (ref 12.2–16.2)

## 2017-07-25 NOTE — Patient Instructions (Signed)
Diet for Irritable Bowel Syndrome When you have irritable bowel syndrome (IBS), the foods you eat and your eating habits are very important. IBS may cause various symptoms, such as abdominal pain, constipation, or diarrhea. Choosing the right foods can help ease discomfort caused by these symptoms. Work with your health care provider and dietitian to find the best eating plan to help control your symptoms. What general guidelines do I need to follow?  Keep a food diary. This will help you identify foods that cause symptoms. Write down: ? What you eat and when. ? What symptoms you have. ? When symptoms occur in relation to your meals.  Avoid foods that cause symptoms. Talk with your dietitian about other ways to get the same nutrients that are in these foods.  Eat more foods that contain fiber. Take a fiber supplement if directed by your dietitian.  Eat your meals slowly, in a relaxed setting.  Aim to eat 5-6 small meals per day. Do not skip meals.  Drink enough fluids to keep your urine clear or pale yellow.  Ask your health care provider if you should take an over-the-counter probiotic during flare-ups to help restore healthy gut bacteria.  If you have cramping or diarrhea, try making your meals low in fat and high in carbohydrates. Examples of carbohydrates are pasta, rice, whole grain breads and cereals, fruits, and vegetables.  If dairy products cause your symptoms to flare up, try eating less of them. You might be able to handle yogurt better than other dairy products because it contains bacteria that help with digestion. What foods are not recommended? The following are some foods and drinks that may worsen your symptoms:  Fatty foods, such as French fries.  Milk products, such as cheese or ice cream.  Chocolate.  Alcohol.  Products with caffeine, such as coffee.  Carbonated drinks, such as soda.  The items listed above may not be a complete list of foods and beverages to  avoid. Contact your dietitian for more information. What foods are good sources of fiber? Your health care provider or dietitian may recommend that you eat more foods that contain fiber. Fiber can help reduce constipation and other IBS symptoms. Add foods with fiber to your diet a little at a time so that your body can get used to them. Too much fiber at once might cause gas and swelling of your abdomen. The following are some foods that are good sources of fiber:  Apples.  Peaches.  Pears.  Berries.  Figs.  Broccoli (raw).  Cabbage.  Carrots.  Raw peas.  Kidney beans.  Lima beans.  Whole grain bread.  Whole grain cereal.  Where to find more information: International Foundation for Functional Gastrointestinal Disorders: www.iffgd.org National Institute of Diabetes and Digestive and Kidney Diseases: www.niddk.nih.gov/health-information/health-topics/digestive-diseases/ibs/Pages/facts.aspx This information is not intended to replace advice given to you by your health care provider. Make sure you discuss any questions you have with your health care provider. Document Released: 06/15/2003 Document Revised: 08/31/2015 Document Reviewed: 06/25/2013 Elsevier Interactive Patient Education  2018 Elsevier Inc.  

## 2017-07-28 ENCOUNTER — Encounter: Payer: Self-pay | Admitting: Nurse Practitioner

## 2017-07-28 DIAGNOSIS — K582 Mixed irritable bowel syndrome: Secondary | ICD-10-CM | POA: Insufficient documentation

## 2017-07-28 DIAGNOSIS — K219 Gastro-esophageal reflux disease without esophagitis: Secondary | ICD-10-CM | POA: Insufficient documentation

## 2017-07-28 NOTE — Progress Notes (Signed)
Subjective:  Presents for c/o rectal pressure off/on for the past 2 months. No fevers. No abdominal pain. Alternating cycles of diarrhea and constipation. No mucus or obvious blood in her stools. Some darker stools at times. Reflux stable on Protonix. Tried stopping med but after a few days her reflux came back so she has taken medication daily since. Had a colonoscopy 08/04/12. Thinks she is good for 10 years. Unsure about results as far as polyps. No rectal pain or itching. No history of hemorrhoids. Admits to increased stress.   Objective:   BP 138/86   Ht 5\' 2"  (1.575 m)   Wt 242 lb 3.2 oz (109.9 kg)   BMI 44.30 kg/m  NAD. Alert, oriented. Lungs clear. Heart RRR. Abdomen obese, soft, non distended with active BS. Non tender to palpation. External rectal exam: small external pink hemorrhoid noted. Digital exam: no masses; no stool for hemoccult. Results for orders placed or performed in visit on 07/25/17  POCT hemoglobin  Result Value Ref Range   Hemoglobin 13.7 12.2 - 16.2 g/dL     Assessment:   Problem List Items Addressed This Visit      Digestive   Gastroesophageal reflux disease without esophagitis   Irritable bowel syndrome with both constipation and diarrhea - Primary   Relevant Orders   IFOBT POC (occult bld, rslt in office)    Other Visit Diagnoses    Rectal pressure       Relevant Orders   IFOBT POC (occult bld, rslt in office)   External hemorrhoid       Relevant Orders   POCT hemoglobin (Completed)   IFOBT POC (occult bld, rslt in office)       Plan:  Discussed lifestyle factors including diet and stress affecting IBS. Patient to get iFOBT sample and bring back to office.  Warning signs reviewed.   A review of her colonoscopy results show one benign adenoma was removed, otherwise normal. Next colonoscopy is in 10 years.  Return if symptoms worsen or fail to improve.  25 minutes was spent with the patient.  This statement verifies that 25 minutes was indeed spent  with the patient. Greater than half the time was spent in discussion, counseling and answering questions  regarding the issues that the patient came in for today as reflected in the diagnosis (s) please refer to documentation for further details.

## 2017-07-29 ENCOUNTER — Other Ambulatory Visit: Payer: Self-pay | Admitting: *Deleted

## 2017-07-29 ENCOUNTER — Telehealth: Payer: Self-pay | Admitting: *Deleted

## 2017-07-29 DIAGNOSIS — K582 Mixed irritable bowel syndrome: Secondary | ICD-10-CM

## 2017-07-29 DIAGNOSIS — K644 Residual hemorrhoidal skin tags: Secondary | ICD-10-CM

## 2017-07-29 DIAGNOSIS — R198 Other specified symptoms and signs involving the digestive system and abdomen: Secondary | ICD-10-CM

## 2017-07-29 LAB — IFOBT (OCCULT BLOOD): IFOBT: POSITIVE

## 2017-07-29 NOTE — Telephone Encounter (Signed)
hemosure test positive. See lab results.

## 2017-08-05 ENCOUNTER — Telehealth: Payer: Self-pay | Admitting: Family Medicine

## 2017-08-05 DIAGNOSIS — K921 Melena: Secondary | ICD-10-CM

## 2017-08-05 NOTE — Telephone Encounter (Signed)
Patient still waiting to hear results from stool sample from last week.

## 2017-08-05 NOTE — Telephone Encounter (Signed)
Stool testing positive for blood I recommend referral back to gastroenterology for further evaluation possible early your colonoscopy.  Referral to [facility]-notify patient

## 2017-08-05 NOTE — Telephone Encounter (Signed)
Carolyn ordered hemosure and it was positive. Please advise

## 2017-08-06 NOTE — Telephone Encounter (Signed)
Patient advised per Dr Nicki Reaper that her test was positive for blood and he recommends referral back to her GI for further evaluation. Patient verbalized understanding. Referral ordered in Epic.

## 2017-08-08 ENCOUNTER — Other Ambulatory Visit: Payer: Self-pay | Admitting: Family Medicine

## 2017-08-13 ENCOUNTER — Encounter: Payer: Self-pay | Admitting: Family Medicine

## 2017-08-13 ENCOUNTER — Other Ambulatory Visit: Payer: Self-pay | Admitting: Family Medicine

## 2017-08-13 ENCOUNTER — Ambulatory Visit (INDEPENDENT_AMBULATORY_CARE_PROVIDER_SITE_OTHER): Payer: PPO | Admitting: Family Medicine

## 2017-08-13 ENCOUNTER — Ambulatory Visit (HOSPITAL_COMMUNITY)
Admission: RE | Admit: 2017-08-13 | Discharge: 2017-08-13 | Disposition: A | Discharge status: Home / Self Care | Payer: PPO | Source: Ambulatory Visit | Attending: Family Medicine | Admitting: Family Medicine

## 2017-08-13 VITALS — BP 166/96 | Temp 98.7°F | Ht 62.0 in | Wt 245.0 lb

## 2017-08-13 DIAGNOSIS — K921 Melena: Secondary | ICD-10-CM

## 2017-08-13 DIAGNOSIS — J181 Lobar pneumonia, unspecified organism: Secondary | ICD-10-CM | POA: Diagnosis not present

## 2017-08-13 DIAGNOSIS — M47814 Spondylosis without myelopathy or radiculopathy, thoracic region: Secondary | ICD-10-CM | POA: Insufficient documentation

## 2017-08-13 DIAGNOSIS — G4733 Obstructive sleep apnea (adult) (pediatric): Secondary | ICD-10-CM

## 2017-08-13 DIAGNOSIS — M546 Pain in thoracic spine: Secondary | ICD-10-CM

## 2017-08-13 DIAGNOSIS — D509 Iron deficiency anemia, unspecified: Secondary | ICD-10-CM

## 2017-08-13 DIAGNOSIS — M47812 Spondylosis without myelopathy or radiculopathy, cervical region: Secondary | ICD-10-CM | POA: Diagnosis not present

## 2017-08-13 DIAGNOSIS — R079 Chest pain, unspecified: Secondary | ICD-10-CM | POA: Diagnosis not present

## 2017-08-13 DIAGNOSIS — Z79891 Long term (current) use of opiate analgesic: Secondary | ICD-10-CM | POA: Diagnosis not present

## 2017-08-13 MED ORDER — HYDROCODONE-ACETAMINOPHEN 10-325 MG PO TABS: 1.0000 | ORAL_TABLET | Freq: Three times a day (TID) | ORAL | 0 refills | Status: DC | PRN

## 2017-08-13 NOTE — Progress Notes (Addendum)
Subjective:    Patient ID: Deborah Carroll, female    DOB: 02-06-1956, 62 y.o.   MRN: [id number]  HPI Patient is here today with complaints of severe back pain on right side and some abd pain for three weeks now. She states she is just gradually came on aches in her back sometimes hurts when she bends and rotates other times when she takes a deep breath denies coughing wheezing night sweats denies nausea vomiting diarrhea   No burning nor stinging when urinating. She has been using hydrocodone and heating pad. She states she is unable to give urine sample at this time. This patient does not recall anything that caused this she states is been going on for the past several weeks when she tries to get up out of a chair it hurts when she arches forward it hurts when she rotates in certain directions it hurts also if she takes a deep breath it hurts she denies sweats chills fever denies nausea or vomiting states her energy level overall fairly good denies hematuria denies flank pain she has not had this particular problem before but she has had low back pain in the past and also has a fair amount of arthritic changes in her back and legs  Review of Systems  Constitutional: Positive for fatigue. Negative for activity change, diaphoresis, fever and unexpected weight change.  HENT: Negative for congestion, ear pain and rhinorrhea.   Eyes: Negative for discharge.  Respiratory: Negative for cough, shortness of breath and wheezing.   Cardiovascular: Negative for chest pain and leg swelling.  Gastrointestinal: Negative for abdominal pain, blood in stool, diarrhea, nausea and vomiting.  Genitourinary: Positive for flank pain. Negative for difficulty urinating, frequency and urgency.  Musculoskeletal: Positive for back pain. Negative for arthralgias and gait problem.  Neurological: Negative for dizziness, light-headedness and headaches.  Hematological: Negative for adenopathy. Does not bruise/bleed easily.    Psychiatric/Behavioral: Negative for agitation and behavioral problems.       Objective:   Physical Exam  Constitutional: She appears well-nourished. No distress.  HENT:  Head: Normocephalic and atraumatic.  Eyes: Right eye exhibits no discharge. Left eye exhibits no discharge.  Neck: No tracheal deviation present.  Cardiovascular: Normal rate, regular rhythm and normal heart sounds.  No murmur heard. Pulmonary/Chest: Effort normal and breath sounds normal. No respiratory distress.  Abdominal: Soft. She exhibits no distension and no mass. There is no tenderness. There is no guarding.  Musculoskeletal: She exhibits no edema.  Lymphadenopathy:    She has no cervical adenopathy.  Neurological: She is alert. She exhibits normal muscle tone.  Skin: Skin is warm and dry.  Psychiatric: Her behavior is normal.  Vitals reviewed.         Assessment & Plan:  Mid back pain radiates to the right side radicular If persistent over the next several weeks consider MRI Stretching exercises shown May use heating pad as needed  The patient was seen in followup for chronic pain. A review over at their current pain status was discussed. Discussion was held regarding the importance of compliance with medication as well as pain medication contract. Discussion with the patient regarding the medication as it pertains to allowing for blunting of pain levels as well as improvement of function was completed. Questions regarding the pain management were answered. Importance of regular followup visits was discussed. Patient was informed that medication may cause drowsiness and should not be combined  with other medications/alcohol or street drugs. Patient was cautioned  that drowsiness may impair ability to operate heavy machinery/vehicles/dangerous activities and should take this into consideration.  Drug registry checked 3 prescriptions given patient uses anywhere between 2 and 3/day  Should be  noted recently patient had blood in her stool we are getting her in with gastroenterology.  Blood in stool will be seeing Dr. Oneida Alar in the near future  Anemia testing is indicated because of blood in the stool  We will also check inflammatory testing for her back as well as a chest x-ray and thoracic spine to rule out bone lesions  It should also be noted that she does have sleep apnea she uses her CPAP machine and is consistent and faithful with its use.  Periodically needs supplies.

## 2017-08-14 LAB — CBC WITH DIFFERENTIAL/PLATELET
Basophils Absolute: 0 10*3/uL (ref 0.0–0.2)
Basos: 0 %
EOS (ABSOLUTE): 0.2 10*3/uL (ref 0.0–0.4)
Eos: 1 %
Hematocrit: 41.8 % (ref 34.0–46.6)
Hemoglobin: 14.1 g/dL (ref 11.1–15.9)
Immature Grans (Abs): 0.1 10*3/uL (ref 0.0–0.1)
Immature Granulocytes: 1 %
Lymphocytes Absolute: 3.3 10*3/uL — ABNORMAL HIGH (ref 0.7–3.1)
Lymphs: 27 %
MCH: 29.2 pg (ref 26.6–33.0)
MCHC: 33.7 g/dL (ref 31.5–35.7)
MCV: 87 fL (ref 79–97)
Monocytes Absolute: 0.9 10*3/uL (ref 0.1–0.9)
Monocytes: 7 %
Neutrophils Absolute: 8.1 10*3/uL — ABNORMAL HIGH (ref 1.4–7.0)
Neutrophils: 64 %
Platelets: 201 10*3/uL (ref 150–379)
RBC: 4.83 x10E6/uL (ref 3.77–5.28)
RDW: 13.8 % (ref 12.3–15.4)
WBC: 12.6 10*3/uL — ABNORMAL HIGH (ref 3.4–10.8)

## 2017-08-14 LAB — SEDIMENTATION RATE: Sed Rate: 17 mm/hr (ref 0–40)

## 2017-08-14 LAB — C-REACTIVE PROTEIN: CRP: 13.4 mg/L — ABNORMAL HIGH (ref 0.0–4.9)

## 2017-08-14 LAB — LACTATE DEHYDROGENASE: LDH: 235 IU/L — ABNORMAL HIGH (ref 119–226)

## 2017-08-14 LAB — FERRITIN: Ferritin: 139 ng/mL (ref 15–150)

## 2017-08-14 NOTE — Telephone Encounter (Signed)
Was discussed during visit with Dr. Nicki Reaper on 08/13/17

## 2017-08-15 ENCOUNTER — Encounter: Payer: Self-pay | Admitting: Family Medicine

## 2017-08-15 ENCOUNTER — Other Ambulatory Visit: Payer: Self-pay | Admitting: Family Medicine

## 2017-08-15 ENCOUNTER — Other Ambulatory Visit (HOSPITAL_COMMUNITY)
Admission: RE | Admit: 2017-08-15 | Discharge: 2017-08-15 | Disposition: A | Discharge status: Home / Self Care | Payer: PPO | Source: Ambulatory Visit | Attending: Family Medicine | Admitting: Family Medicine

## 2017-08-15 ENCOUNTER — Ambulatory Visit (INDEPENDENT_AMBULATORY_CARE_PROVIDER_SITE_OTHER): Payer: PPO | Admitting: Family Medicine

## 2017-08-15 VITALS — Temp 98.2°F | Ht 62.0 in | Wt 247.2 lb

## 2017-08-15 DIAGNOSIS — J157 Pneumonia due to Mycoplasma pneumoniae: Secondary | ICD-10-CM | POA: Diagnosis not present

## 2017-08-15 LAB — CBC WITH DIFFERENTIAL/PLATELET
Basophils Absolute: 0 10*3/uL (ref 0.0–0.1)
Basophils Relative: 0 %
Eosinophils Absolute: 0.2 10*3/uL (ref 0.0–0.7)
Eosinophils Relative: 1 %
HCT: 41.6 % (ref 36.0–46.0)
Hemoglobin: 13.6 g/dL (ref 12.0–15.0)
Lymphocytes Relative: 27 %
Lymphs Abs: 2.9 10*3/uL (ref 0.7–4.0)
MCH: 29.2 pg (ref 26.0–34.0)
MCHC: 32.7 g/dL (ref 30.0–36.0)
MCV: 89.3 fL (ref 78.0–100.0)
Monocytes Absolute: 0.6 10*3/uL (ref 0.1–1.0)
Monocytes Relative: 6 %
Neutro Abs: 7 10*3/uL (ref 1.7–7.7)
Neutrophils Relative %: 66 %
Platelets: 186 10*3/uL (ref 150–400)
RBC: 4.66 MIL/uL (ref 3.87–5.11)
RDW: 13 % (ref 11.5–15.5)
WBC: 10.7 10*3/uL — ABNORMAL HIGH (ref 4.0–10.5)

## 2017-08-15 LAB — BASIC METABOLIC PANEL
Anion gap: 7 (ref 5–15)
BUN: 9 mg/dL (ref 6–20)
CO2: 32 mmol/L (ref 22–32)
Calcium: 9.2 mg/dL (ref 8.9–10.3)
Chloride: 101 mmol/L (ref 101–111)
Creatinine, Ser: 0.65 mg/dL (ref 0.44–1.00)
GFR calc Af Amer: >60 mL/min (ref >60)
GFR calc non Af Amer: >60 mL/min (ref >60)
Glucose, Bld: 133 mg/dL — ABNORMAL HIGH (ref 65–99)
Potassium: 3.6 mmol/L (ref 3.5–5.1)
Sodium: 140 mmol/L (ref 135–145)

## 2017-08-15 LAB — D-DIMER, QUANTITATIVE: D-Dimer, Quant: 0.29 ug/mL-FEU (ref 0.00–0.50)

## 2017-08-15 MED ORDER — AZITHROMYCIN 250 MG PO TABS: ORAL_TABLET | ORAL | 0 refills | Status: DC

## 2017-08-15 NOTE — Progress Notes (Signed)
   Subjective:    Patient ID: Deborah Carroll, female    DOB: [birth year], 62 y.o.   MRN: [id number]  HPI  Patient arrives to discuss results of recent chest xray.  Patient denies any chest tightness pressure pain shortness of breath does relate some back pain with deep inspiration as well as with certain movements does not radiate down the arms or legs.  Denies high fever chills sweats wheezing difficulty breathing patient has felt short of breath at times recent x-ray showed early right middle lobe pneumonia patient has been having symptoms of back pain for 3 weeks  Review of Systems Denies any congestion drainage coughing wheezing difficulty breathing please see above    Objective:   Physical Exam Heart regular no murmurs lungs are clear no crackles HEENT is benign neck no masses   X-ray reviewed with patient and her husband    Assessment & Plan:  Community-acquired pneumonia [facility] work d-dimer because of this pneumonia coming up insidiously we need to rule out possibility of underlying issue with patient having some shortness of breath pedal edema and moderate obesity it is important to rule out with pulmonary embolism may need CAT scan depending on results Will need follow-up x-ray in several weeks

## 2017-08-18 ENCOUNTER — Encounter (INDEPENDENT_AMBULATORY_CARE_PROVIDER_SITE_OTHER): Payer: Self-pay

## 2017-08-18 ENCOUNTER — Encounter: Payer: Self-pay | Admitting: Family Medicine

## 2017-08-18 ENCOUNTER — Encounter: Payer: Self-pay | Admitting: Gastroenterology

## 2017-09-26 ENCOUNTER — Other Ambulatory Visit: Payer: Self-pay

## 2017-09-26 ENCOUNTER — Telehealth: Payer: Self-pay | Admitting: *Deleted

## 2017-09-26 ENCOUNTER — Other Ambulatory Visit: Payer: Self-pay | Admitting: Family Medicine

## 2017-09-26 DIAGNOSIS — Z1231 Encounter for screening mammogram for malignant neoplasm of breast: Secondary | ICD-10-CM

## 2017-09-26 MED ORDER — HYDROCODONE-ACETAMINOPHEN 10-325 MG PO TABS: 1.0000 | ORAL_TABLET | Freq: Three times a day (TID) | ORAL | 0 refills | Status: DC | PRN

## 2017-09-26 NOTE — Telephone Encounter (Signed)
Patient is aware and aware to pick up the medciations.

## 2017-09-26 NOTE — Telephone Encounter (Signed)
Pt lost two of her scripts for hydrocodone. Wants to know if you could rewrite them.  [facility]

## 2017-09-26 NOTE — Telephone Encounter (Signed)
I did check the drug registry The patient may have 2 prescriptions written for her Continue current dosing and instructions The dates for the prescription is September 26, 2017 and October 26, 2017  A follow-up office visit would be necessary before further prescriptions

## 2017-10-01 ENCOUNTER — Ambulatory Visit (HOSPITAL_COMMUNITY): Payer: PPO

## 2017-11-07 ENCOUNTER — Ambulatory Visit: Payer: PPO | Admitting: Gastroenterology

## 2017-11-10 ENCOUNTER — Other Ambulatory Visit: Payer: Self-pay | Admitting: Family Medicine

## 2017-11-13 DIAGNOSIS — H26493 Other secondary cataract, bilateral: Secondary | ICD-10-CM | POA: Diagnosis not present

## 2017-11-13 DIAGNOSIS — H26491 Other secondary cataract, right eye: Secondary | ICD-10-CM | POA: Diagnosis not present

## 2017-11-27 DIAGNOSIS — G4733 Obstructive sleep apnea (adult) (pediatric): Secondary | ICD-10-CM | POA: Diagnosis not present

## 2017-11-27 DIAGNOSIS — H26492 Other secondary cataract, left eye: Secondary | ICD-10-CM | POA: Diagnosis not present

## 2017-12-15 ENCOUNTER — Other Ambulatory Visit: Payer: Self-pay | Admitting: Family Medicine

## 2017-12-30 ENCOUNTER — Telehealth: Payer: Self-pay | Admitting: Family Medicine

## 2017-12-30 MED ORDER — SCOPOLAMINE 1 MG/3DAYS TD PT72: 1.0000 | MEDICATED_PATCH | TRANSDERMAL | 0 refills | Status: DC

## 2017-12-30 NOTE — Addendum Note (Signed)
Addended by: Dairl Ponder on: 12/30/2017 02:50 PM   Modules accepted: Orders

## 2017-12-30 NOTE — Telephone Encounter (Signed)
Pt is going on a 7 day cruise on Saturday and would like some patches to be call in to [facility].

## 2017-12-30 NOTE — Telephone Encounter (Signed)
Scopolamine patches, one box, apply 1 every 3 days, applied before starting the cruise, may have 1 refill May cause dry mouth may cause blurred vision if problems pulled off patch

## 2017-12-30 NOTE — Telephone Encounter (Signed)
Prescription sent electronically to pharmacy. Patient notified. 

## 2018-02-04 ENCOUNTER — Other Ambulatory Visit: Payer: Self-pay | Admitting: Family Medicine

## 2018-02-04 NOTE — Telephone Encounter (Signed)
Give 6 months of refill

## 2018-02-05 NOTE — Telephone Encounter (Signed)
May have 90 days to do follow-up within 90 days

## 2018-02-11 ENCOUNTER — Encounter: Payer: Self-pay | Admitting: Gastroenterology

## 2018-02-11 ENCOUNTER — Ambulatory Visit (INDEPENDENT_AMBULATORY_CARE_PROVIDER_SITE_OTHER): Payer: PPO | Admitting: Gastroenterology

## 2018-02-11 ENCOUNTER — Other Ambulatory Visit: Payer: Self-pay | Admitting: *Deleted

## 2018-02-11 ENCOUNTER — Encounter: Payer: Self-pay | Admitting: *Deleted

## 2018-02-11 DIAGNOSIS — R195 Other fecal abnormalities: Secondary | ICD-10-CM

## 2018-02-11 MED ORDER — PEG 3350-KCL-NA BICARB-NACL 420 G PO SOLR: 4000.0000 mL | Freq: Once | ORAL | 0 refills | Status: AC

## 2018-02-11 NOTE — Progress Notes (Signed)
CC'ED TO PCP 

## 2018-02-11 NOTE — Progress Notes (Addendum)
REVIEWED-NO ADDITIONAL RECOMMENDATIONS.  Referring Provider: Kathyrn Drown, MD Primary Care Physician:  Kathyrn Drown, MD Primary GI: Dr. Oneida Alar   Chief Complaint  Patient presents with  . Blood In Stools    tested +  . Diarrhea    HPI:   Deborah Carroll is a 62 y.o. female presenting today with a history of chronic constipation, with last colonoscopy in 2014. Recently found to be heme positive.    Bristol stool scale #4-6. Will have 2-3 days of nothing then 2-3 days of looser stool. Today was Bristol stool scale #4. No overt GI bleeding. No abdominal pain. Chronic GERD, taking Protonix daily.   Hydrocodone once every 3 weeks.   Past Medical History:  Diagnosis Date  . Anxiety   . Chronic pain   . Depression   . Hypertension   . Peripheral neuropathy   . Seizures ([place])    last one 1991  . Sleep apnea    CPAP    Past Surgical History:  Procedure Laterality Date  . ABDOMINAL HYSTERECTOMY     ovaries present, no cancer,approx 2001  . BACK SURGERY    . CARPAL TUNNEL RELEASE     Bilateral  . CATARACT EXTRACTION W/PHACO Right 06/20/2015   Procedure: CATARACT EXTRACTION PHACO AND INTRAOCULAR LENS PLACEMENT (IOC);  Surgeon: Rutherford Guys, MD;  Location: [facility];  Service: Ophthalmology;  Laterality: Right;  CDE:6.25  . CATARACT EXTRACTION W/PHACO Left 08/29/2015   Procedure: CATARACT EXTRACTION PHACO AND INTRAOCULAR LENS PLACEMENT (IOC);  Surgeon: Rutherford Guys, MD;  Location: [facility];  Service: Ophthalmology;  Laterality: Left;  CDE: 4.11  . CESAREAN SECTION     x2  . CHOLECYSTECTOMY    . COLONOSCOPY  10/14/2002   LXB:WIOMBTDHRC involving descending colon, splenic flexure and distal transverse colon with most extensive inflammation in the splenic flexure area.  These changes are typical of ischemic colitis.  Biopsy taken.  . COLONOSCOPY N/A 08/04/2012   SLF: POLYPOID LESION IN AT THE APPENDICEAL ORIFICE/Polyp  in the descending colon/Moderate melanosis throughout the  entire examined colon/ The colon IS redundant. small tubular adenoma.   Marland Kitchen FOOT SURGERY  2004  . PARS PLANA VITRECTOMY W/ REPAIR OF MACULAR HOLE      Current Outpatient Medications  Medication Sig Dispense Refill  . DULoxetine (CYMBALTA) 60 MG capsule TAKE 1 CAPSULE(60 MG) BY MOUTH DAILY 90 capsule 2  . HYDROcodone-acetaminophen (NORCO) 10-325 MG tablet Take 1 tablet by mouth 3 (three) times daily as needed for moderate pain. May fill 30 days 09/26/2017 90 tablet 0  . pantoprazole (PROTONIX) 40 MG tablet TAKE 1 TABLET BY MOUTH DAILY 90 tablet 0  . phenytoin (DILANTIN) 100 MG ER capsule Take 100 mg by mouth 2 (two) times daily. Take 2 capsules in morning and two at night    . potassium chloride (K-DUR,KLOR-CON) 10 MEQ tablet Take 1 tablet (10 mEq total) by mouth 2 (two) times daily. (Patient taking differently: Take 10 mEq by mouth as needed. ) 180 tablet 2  . torsemide (DEMADEX) 20 MG tablet 1 or 2 qam for edema (Patient taking differently: as needed. 1 or 2 qam for edema) 180 tablet 2   No current facility-administered medications for this visit.     Allergies as of 02/11/2018  . (No Known Allergies)    Family History  Problem Relation Age of Onset  . Colon polyps Mother 73       benign   . Hypertension Maternal Grandmother   .  Heart attack Maternal Grandfather   . Colon cancer Neg Hx   . Liver disease Neg Hx     Social History   Socioeconomic History  . Marital status: Married    Spouse name: Not on file  . Number of children: 2  . Years of education: Not on file  . Highest education level: Not on file  Occupational History  . Occupation: disability  Social Needs  . Financial resource strain: Not on file  . Food insecurity:    Worry: Not on file    Inability: Not on file  . Transportation needs:    Medical: Not on file    Non-medical: Not on file  Tobacco Use  . Smoking status: Never Smoker  . Smokeless tobacco: Never Used  Substance and Sexual Activity  .  Alcohol use: Yes    Comment: rare  . Drug use: No  . Sexual activity: Yes    Birth control/protection: Surgical  Lifestyle  . Physical activity:    Days per week: Not on file    Minutes per session: Not on file  . Stress: Not on file  Relationships  . Social connections:    Talks on phone: Not on file    Gets together: Not on file    Attends religious service: Not on file    Active member of club or organization: Not on file    Attends meetings of clubs or organizations: Not on file    Relationship status: Not on file  Other Topics Concern  . Not on file  Social History Narrative  . Not on file    Review of Systems: Gen: Denies fever, chills, anorexia. Denies fatigue, weakness, weight loss.  CV: Denies chest pain, palpitations, syncope, peripheral edema, and claudication. Resp: Denies dyspnea at rest, cough, wheezing, coughing up blood, and pleurisy. GI: see HPI  Derm: Denies rash, itching, dry skin Psych: Denies depression, anxiety, memory loss, confusion. No homicidal or suicidal ideation.  Heme: Denies bruising, bleeding, and enlarged lymph nodes.  Physical Exam: BP (!) 166/85   Pulse 64   Temp 98.6 F (37 C) (Oral)   Ht 5\' 2"  (1.575 m)   Wt 243 lb (110.2 kg)   BMI 44.45 kg/m  General:   Alert and oriented. No distress noted. Pleasant and cooperative.  Head:  Normocephalic and atraumatic. Eyes:  Conjuctiva clear without scleral icterus. Mouth:  Oral mucosa pink and moist. Good dentition. No lesions. Cardiac: S1 S2 present without murmurs  Lungs: clear to auscultation bilaterally  Abdomen:  +BS, soft, non-tender and non-distended. No rebound or guarding. No HSM or masses noted. Msk:  Symmetrical without gross deformities. Normal posture. Extremities:  Without edema. Neurologic:  Alert and  oriented x4 Psych:  Alert and cooperative. Normal mood and affect.

## 2018-02-11 NOTE — Patient Instructions (Signed)
We have arranged a colonoscopy with Dr. Oneida Alar in the near future.  I recommend getting Benefiber or generic equivalent and taking it as directed on the bottle. Please let us know if this does not help to regulate your bowel habits.  It was a pleasure to see you today. I strive to create trusting relationships with patients to provide genuine, compassionate, and quality care. I value your feedback. If you receive a survey regarding your visit,  I greatly appreciate you taking time to fill this out.   Annitta Needs, PhD, ANP-BC [facility]

## 2018-02-11 NOTE — Assessment & Plan Note (Signed)
Pleasant 62 year old female found to have heme positive stool recently but without overt GI bleeding. No concerning upper or lower GI symptoms. Mother with polyps (thought to be benign) age greater than 19. No family history of colon cancer. Intermittent constipation/looser stool but chronic. Start benefiber.   Proceed with colonoscopy with Dr. Oneida Alar in the near future. The risks, benefits, and alternatives have been discussed in detail with the patient. They state understanding and desire to proceed.  Propofol due to polypharmacy Fiber supplementation daily

## 2018-02-12 ENCOUNTER — Telehealth: Payer: Self-pay | Admitting: *Deleted

## 2018-02-12 NOTE — Telephone Encounter (Signed)
Pre-op scheduled for 04/28/18 at 11:00am. Patient aware. Letter mailed.

## 2018-03-07 ENCOUNTER — Other Ambulatory Visit: Payer: Self-pay | Admitting: Family Medicine

## 2018-03-18 ENCOUNTER — Ambulatory Visit: Payer: PPO | Admitting: Family Medicine

## 2018-03-20 ENCOUNTER — Ambulatory Visit (INDEPENDENT_AMBULATORY_CARE_PROVIDER_SITE_OTHER): Payer: PPO | Admitting: Family Medicine

## 2018-03-20 ENCOUNTER — Encounter: Payer: Self-pay | Admitting: Family Medicine

## 2018-03-20 VITALS — BP 138/86 | Temp 99.1°F | Ht 62.0 in | Wt 244.1 lb

## 2018-03-20 DIAGNOSIS — G4733 Obstructive sleep apnea (adult) (pediatric): Secondary | ICD-10-CM | POA: Diagnosis not present

## 2018-03-20 DIAGNOSIS — R569 Unspecified convulsions: Secondary | ICD-10-CM | POA: Diagnosis not present

## 2018-03-20 DIAGNOSIS — G609 Hereditary and idiopathic neuropathy, unspecified: Secondary | ICD-10-CM | POA: Diagnosis not present

## 2018-03-20 DIAGNOSIS — R7303 Prediabetes: Secondary | ICD-10-CM | POA: Diagnosis not present

## 2018-03-20 DIAGNOSIS — Z13220 Encounter for screening for lipoid disorders: Secondary | ICD-10-CM | POA: Diagnosis not present

## 2018-03-20 DIAGNOSIS — K219 Gastro-esophageal reflux disease without esophagitis: Secondary | ICD-10-CM

## 2018-03-20 DIAGNOSIS — Z23 Encounter for immunization: Secondary | ICD-10-CM

## 2018-03-20 DIAGNOSIS — Z79899 Other long term (current) drug therapy: Secondary | ICD-10-CM

## 2018-03-20 HISTORY — DX: Morbid (severe) obesity due to excess calories: E66.01

## 2018-03-20 MED ORDER — HYDROCODONE-ACETAMINOPHEN 10-325 MG PO TABS: 1.0000 | ORAL_TABLET | Freq: Three times a day (TID) | ORAL | 0 refills | Status: DC | PRN
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
## 2018-04-13 ENCOUNTER — Ambulatory Visit (HOSPITAL_COMMUNITY): Payer: PPO

## 2018-04-17 ENCOUNTER — Ambulatory Visit (HOSPITAL_COMMUNITY): Payer: PPO

## 2018-04-21 NOTE — Patient Instructions (Signed)
Deborah Carroll  04/21/2018     @PREFPERIOPPHARMACY @   Your procedure is scheduled on  05/05/2018.  Report to [facility] at  700  A.M.  Call this number if you have problems the morning of surgery:  [phone]   Remember:  Follow the diet and prep instructions given to you by Dr Nona Dell office.                      Take these medicines the morning of surgery with A SIP OF WATER  Cymbalta, hydrocodone ( if needed), pantoprazole, dilantin.    Do not wear jewelry, make-up or nail polish.  Do not wear lotions, powders, or perfumes, or deodorant.  Do not shave 48 hours prior to surgery.  Men may shave face and neck.  Do not bring valuables to the hospital.  [facility] is not responsible for any belongings or valuables.  Contacts, dentures or bridgework may not be worn into surgery.  Leave your suitcase in the car.  After surgery it may be brought to your room.  For patients admitted to the hospital, discharge time will be determined by your treatment team.  Patients discharged the day of surgery will not be allowed to drive home.   Name and phone number of your driver:   family Special instructions:  None  Please read over the following fact sheets that you were given. Anesthesia Post-op Instructions and Care and Recovery After Surgery       Colonoscopy, Adult A colonoscopy is an exam to look at the large intestine. It is done to check for problems, such as:  Lumps (tumors).  Growths (polyps).  Swelling (inflammation).  Bleeding. What happens before the procedure? Eating and drinking Follow instructions from your doctor about eating and drinking. These instructions may include:  A few days before the procedure - follow a low-fiber diet. ? Avoid nuts. ? Avoid seeds. ? Avoid dried fruit. ? Avoid raw fruits. ? Avoid vegetables.  1-3 days before the procedure - follow a clear liquid diet. Avoid liquids that have red or purple dye. Drink only clear  liquids, such as: ? Clear broth or bouillon. ? Black coffee or tea. ? Clear juice. ? Clear soft drinks or sports drinks. ? Gelatin dessert. ? Popsicles.  On the day of the procedure - do not eat or drink anything during the 2 hours before the procedure. Up to 2 hours before the procedure, you may continue to drink clear liquids, such as water or clear fruit juice.  Bowel prep If you were prescribed an oral bowel prep:  Take it as told by your doctor. Starting the day before your procedure, you will need to drink a lot of liquid. The liquid will cause you to poop (have bowel movements) until your poop is almost clear or light green.  To clean out your colon, you may also be given: ? Laxative medicines. ? Instructions about how to use an enema.  If your skin or butt gets irritated from diarrhea, you may: ? Wipe the area with wipes that have medicine in them, such as adult wet wipes with aloe and vitamin E. ? Put something on your skin that soothes the area, such as petroleum jelly.  If you throw up (vomit) while drinking the bowel prep, take a break for up to 60 minutes. Then begin the bowel prep again. If you keep throwing up and you cannot  take the bowel prep without throwing up, call your doctor. General instructions  Ask your doctor about: ? Changing or stopping your normal medicines. This is important if you take iron pills, diabetes medicines, or blood thinners. ? Taking medicines such as aspirin and ibuprofen. These medicines can thin your blood. Do not take these medicines unless your doctor tells you to take them.  Plan to have someone take you home from the hospital or clinic. What happens during the procedure?   An IV tube may be put into one of your veins.  You will be given medicine to help you relax (sedative).  To reduce your risk of infection: ? Your doctors will wash their hands. ? Your anal area will be washed with soap.  You will be asked to lie on your side  with your knees bent.  Your doctor will get a long, thin, flexible tube ready. The tube will have a camera and a light on the end.  The tube will be put into your anus.  The tube will be gently put into your large intestine.  Air will be delivered into your large intestine to keep it open. You may feel some pressure or cramping.  The camera will be used to take photos.  A small tissue sample may be removed for testing (biopsy).  If small growths are found, your doctor may remove them and have them checked for cancer.  The tube that was put into your anus will be slowly removed. The procedure may vary among doctors and hospitals. What happens after the procedure?  Your doctor will check on you often until the medicines you were given have worn off.  Do not drive for 24 hours after the procedure.  You may have a small amount of blood in your poop.  You may pass gas.  You may have mild cramps or bloating in your belly (abdomen).  It is up to you to get the results of your procedure. Ask your doctor, or the department performing the procedure, when your results will be ready. Summary  A colonoscopy is an exam to look at the large intestine.  Follow instructions from your doctor about eating and drinking before the procedure.  If you were prescribed an oral bowel prep to clean out your colon, take it as told by your doctor.  Your doctor will check on you often until the medicines you were given have worn off.  Plan to have someone take you home from the hospital or clinic. This information is not intended to replace advice given to you by your health care provider. Make sure you discuss any questions you have with your health care provider. Document Released: 04/27/2010 Document Revised: 01/22/2017 Document Reviewed: 06/06/2015 Elsevier Interactive Patient Education  2019 [employer]  Colonoscopy, Adult, Care After This sheet gives you information about how to care for  yourself after your procedure. Your health care provider may also give you more specific instructions. If you have problems or questions, contact your health care provider. What can I expect after the procedure? After the procedure, it is common to have:  A small amount of blood in your stool for 24 hours after the procedure.  Some gas.  Mild abdominal cramping or bloating. Follow these instructions at home: General instructions  For the first 24 hours after the procedure: ? Do not drive or use machinery. ? Do not sign important documents. ? Do not drink alcohol. ? Do your regular daily activities at a  slower pace than normal. ? Eat soft, easy-to-digest foods.  Take over-the-counter or prescription medicines only as told by your health care provider. Relieving cramping and bloating   Try walking around when you have cramps or feel bloated.  Apply heat to your abdomen as told by your health care provider. Use a heat source that your health care provider recommends, such as a moist heat pack or a heating pad. ? Place a towel between your skin and the heat source. ? Leave the heat on for 20-30 minutes. ? Remove the heat if your skin turns bright red. This is especially important if you are unable to feel pain, heat, or cold. You may have a greater risk of getting burned. Eating and drinking   Drink enough fluid to keep your urine pale yellow.  Resume your normal diet as instructed by your health care provider. Avoid heavy or fried foods that are hard to digest.  Avoid drinking alcohol for as long as instructed by your health care provider. Contact a health care provider if:  You have blood in your stool 2-3 days after the procedure. Get help right away if:  You have more than a small spotting of blood in your stool.  You pass large blood clots in your stool.  Your abdomen is swollen.  You have nausea or vomiting.  You have a fever.  You have increasing abdominal pain  that is not relieved with medicine. Summary  After the procedure, it is common to have a small amount of blood in your stool. You may also have mild abdominal cramping and bloating.  For the first 24 hours after the procedure, do not drive or use machinery, sign important documents, or drink alcohol.  Contact your health care provider if you have a lot of blood in your stool, nausea or vomiting, a fever, or increased abdominal pain. This information is not intended to replace advice given to you by your health care provider. Make sure you discuss any questions you have with your health care provider. Document Released: 11/07/2003 Document Revised: 01/15/2017 Document Reviewed: 06/06/2015 Elsevier Interactive Patient Education  2019 [place] Anesthesia is a term that refers to techniques, procedures, and medicines that help a person stay safe and comfortable during a medical procedure. Monitored anesthesia care, or sedation, is one type of anesthesia. Your anesthesia specialist may recommend sedation if you will be having a procedure that does not require you to be unconscious, such as:  Cataract surgery.  A dental procedure.  A biopsy.  A colonoscopy. During the procedure, you may receive a medicine to help you relax (sedative). There are three levels of sedation:  Mild sedation. At this level, you may feel awake and relaxed. You will be able to follow directions.  Moderate sedation. At this level, you will be sleepy. You may not remember the procedure.  Deep sedation. At this level, you will be asleep. You will not remember the procedure. The more medicine you are given, the deeper your level of sedation will be. Depending on how you respond to the procedure, the anesthesia specialist may change your level of sedation or the type of anesthesia to fit your needs. An anesthesia specialist will monitor you closely during the procedure. Let your health care  provider know about:  Any allergies you have.  All medicines you are taking, including vitamins, herbs, eye drops, creams, and over-the-counter medicines.  Any use of steroids (by mouth or as a cream).  Any problems you or family members have had with sedatives and anesthetic medicines.  Any blood disorders you have.  Any surgeries you have had.  Any medical conditions you have, such as sleep apnea.  Whether you are pregnant or may be pregnant.  Any use of cigarettes, alcohol, or street drugs. What are the risks? Generally, this is a safe procedure. However, problems may occur, including:  Getting too much medicine (oversedation).  Nausea.  Allergic reaction to medicines.  Trouble breathing. If this happens, a breathing tube may be used to help with breathing. It will be removed when you are awake and breathing on your own.  Heart trouble.  Lung trouble. Before the procedure Staying hydrated Follow instructions from your health care provider about hydration, which may include:  Up to 2 hours before the procedure - you may continue to drink clear liquids, such as water, clear fruit juice, black coffee, and plain tea. Eating and drinking restrictions Follow instructions from your health care provider about eating and drinking, which may include:  8 hours before the procedure - stop eating heavy meals or foods such as meat, fried foods, or fatty foods.  6 hours before the procedure - stop eating light meals or foods, such as toast or cereal.  6 hours before the procedure - stop drinking milk or drinks that contain milk.  2 hours before the procedure - stop drinking clear liquids. Medicines Ask your health care provider about:  Changing or stopping your regular medicines. This is especially important if you are taking diabetes medicines or blood thinners.  Taking medicines such as aspirin and ibuprofen. These medicines can thin your blood. Do not take these medicines  before your procedure if your health care provider instructs you not to. Tests and exams  You will have a physical exam.  You may have blood tests done to show: ? How well your kidneys and liver are working. ? How well your blood can clot. General instructions  Plan to have someone take you home from the hospital or clinic.  If you will be going home right after the procedure, plan to have someone with you for 24 hours.  What happens during the procedure?  Your blood pressure, heart rate, breathing, level of pain and overall condition will be monitored.  An IV tube will be inserted into one of your veins.  Your anesthesia specialist will give you medicines as needed to keep you comfortable during the procedure. This may mean changing the level of sedation.  The procedure will be performed. After the procedure  Your blood pressure, heart rate, breathing rate, and blood oxygen level will be monitored until the medicines you were given have worn off.  Do not drive for 24 hours if you received a sedative.  You may: ? Feel sleepy, clumsy, or nauseous. ? Feel forgetful about what happened after the procedure. ? Have a sore throat if you had a breathing tube during the procedure. ? Vomit. This information is not intended to replace advice given to you by your health care provider. Make sure you discuss any questions you have with your health care provider. Document Released: 12/19/2004 Document Revised: 09/01/2015 Document Reviewed: 07/16/2015 Elsevier Interactive Patient Education  2019 [place], Care After These instructions provide you with information about caring for yourself after your procedure. Your health care provider may also give you more specific instructions. Your treatment has been planned according to current medical practices, but problems sometimes occur. Call  your health care provider if you have any problems or questions after your  procedure. What can I expect after the procedure? After your procedure, you may:  Feel sleepy for several hours.  Feel clumsy and have poor balance for several hours.  Feel forgetful about what happened after the procedure.  Have poor judgment for several hours.  Feel nauseous or vomit.  Have a sore throat if you had a breathing tube during the procedure. Follow these instructions at home: For at least 24 hours after the procedure:      Have a responsible adult stay with you. It is important to have someone help care for you until you are awake and alert.  Rest as needed.  Do not: ? Participate in activities in which you could fall or become injured. ? Drive. ? Use heavy machinery. ? Drink alcohol. ? Take sleeping pills or medicines that cause drowsiness. ? Make important decisions or sign legal documents. ? Take care of children on your own. Eating and drinking  Follow the diet that is recommended by your health care provider.  If you vomit, drink water, juice, or soup when you can drink without vomiting.  Make sure you have little or no nausea before eating solid foods. General instructions  Take over-the-counter and prescription medicines only as told by your health care provider.  If you have sleep apnea, surgery and certain medicines can increase your risk for breathing problems. Follow instructions from your health care provider about wearing your sleep device: ? Anytime you are sleeping, including during daytime naps. ? While taking prescription pain medicines, sleeping medicines, or medicines that make you drowsy.  If you smoke, do not smoke without supervision.  Keep all follow-up visits as told by your health care provider. This is important. Contact a health care provider if:  You keep feeling nauseous or you keep vomiting.  You feel light-headed.  You develop a rash.  You have a fever. Get help right away if:  You have trouble  breathing. Summary  For several hours after your procedure, you may feel sleepy and have poor judgment.  Have a responsible adult stay with you for at least 24 hours or until you are awake and alert. This information is not intended to replace advice given to you by your health care provider. Make sure you discuss any questions you have with your health care provider. Document Released: 07/16/2015 Document Revised: 11/08/2016 Document Reviewed: 07/16/2015 Elsevier Interactive Patient Education  2019 [employer].

## 2018-04-24 ENCOUNTER — Encounter: Payer: Self-pay | Admitting: *Deleted

## 2018-04-24 ENCOUNTER — Telehealth: Payer: Self-pay | Admitting: Gastroenterology

## 2018-04-24 NOTE — Telephone Encounter (Signed)
Spoke with patient and is r/s'd to 07/14/2018 at 9:30 am. I have mailed new instructions to her. Pre-op is now scheduled for 07/07/2018 at 11:00am. Letter also mailed.
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
## 2018-04-28 ENCOUNTER — Encounter (HOSPITAL_COMMUNITY): Payer: Self-pay

## 2018-04-28 ENCOUNTER — Inpatient Hospital Stay (HOSPITAL_COMMUNITY)
Admission: RE | Admit: 2018-04-28 | Discharge: 2018-04-28 | Disposition: A | Discharge status: Home / Self Care | Payer: PPO | Source: Ambulatory Visit

## 2018-05-27 ENCOUNTER — Telehealth: Payer: Self-pay | Admitting: Family Medicine

## 2018-05-27 NOTE — Telephone Encounter (Signed)
Pt needs Rx for CPAP supplies - please sign so that I may fax with required documentation to [facility]  In red folder in basket on wall  Insurance requires face-to-face OV to discuss use & benefit.  Copy of that OV note must be sent with Rx for CPAP supplies  Please do addendum to OV 03/20/2018 - it does note that pt uses CPAP & should continue to use however it doesn't mention pt benefits from use  Please advise

## 2018-05-28 NOTE — Telephone Encounter (Signed)
Please do addendum to OV 03/20/2018 - it does note that pt uses CPAP & should continue to use however it doesn't mention pt benefits from use  Insurance requires face-to-face OV to discuss use & benefit.  Copy of that OV note must be sent with Rx for CPAP supplies  Please advise

## 2018-05-28 NOTE — Telephone Encounter (Signed)
This was signed.

## 2018-05-29 ENCOUNTER — Other Ambulatory Visit: Payer: Self-pay | Admitting: Family Medicine

## 2018-05-29 ENCOUNTER — Telehealth: Payer: Self-pay | Admitting: Family Medicine

## 2018-05-29 MED ORDER — HYDROCODONE-ACETAMINOPHEN 10-325 MG PO TABS: 1.0000 | ORAL_TABLET | Freq: Three times a day (TID) | ORAL | 0 refills | Status: DC | PRN

## 2018-05-29 NOTE — Telephone Encounter (Signed)
This was sent in as requested It would be necessary for the patient to do a follow-up office visit before further scripts

## 2018-05-29 NOTE — Telephone Encounter (Signed)
Pt contacted and verbalized understanding. Pt states she will call back and set up appt.

## 2018-05-29 NOTE — Telephone Encounter (Signed)
Additional documentation was put into that office visit note thank you

## 2018-05-29 NOTE — Telephone Encounter (Signed)
Patient is requesting refill on hydrocodone 10/325 °

## 2018-06-03 NOTE — Telephone Encounter (Signed)
Faxed order to [facility] 

## 2018-06-09 ENCOUNTER — Other Ambulatory Visit: Payer: Self-pay | Admitting: Family Medicine

## 2018-06-25 DIAGNOSIS — G4733 Obstructive sleep apnea (adult) (pediatric): Secondary | ICD-10-CM | POA: Diagnosis not present

## 2018-06-28 ENCOUNTER — Other Ambulatory Visit: Payer: Self-pay | Admitting: Family Medicine

## 2018-06-29 ENCOUNTER — Telehealth: Payer: Self-pay | Admitting: Gastroenterology

## 2018-06-29 NOTE — Telephone Encounter (Signed)
[facility] PATIENT ABOUT RESCHEDULING HER PROCEDURE

## 2018-06-29 NOTE — Telephone Encounter (Signed)
I spoke with patient and she did not want to r/s her procedure. She was calling to see if she needed to go to pre-op. I advised her yes to go and given her diagnosis, we would still leave her on the schedule. She voiced understanding

## 2018-07-06 ENCOUNTER — Telehealth: Payer: Self-pay | Admitting: Gastroenterology

## 2018-07-06 NOTE — Telephone Encounter (Signed)
Called patient and she was unable to hear me. Will call back

## 2018-07-06 NOTE — Telephone Encounter (Signed)
[phone] please call patient, she has some concerns about having her tcs this week because of COVID-19

## 2018-07-06 NOTE — Telephone Encounter (Signed)
Patient aware she is not r/s'd

## 2018-07-06 NOTE — Patient Instructions (Signed)
Deborah Carroll  07/06/2018     @PREFPERIOPPHARMACY @   Your procedure is scheduled on  07/14/2018.  Report to [facility] at  745  A.M.  Call this number if you have problems the morning of surgery:  [phone]   Remember:               Follow the diet and prep instructions given to you by Dr Nona Dell office.                       Take these medicines the morning of surgery with A SIP OF WATER  Cymbalta, hydrocodone(if needed), protonix, dilantin.    Do not wear jewelry, make-up or nail polish.  Do not wear lotions, powders, or perfumes, or deodorant.  Do not shave 48 hours prior to surgery.  Men may shave face and neck.  Do not bring valuables to the hospital.  [facility] is not responsible for any belongings or valuables.  Contacts, dentures or bridgework may not be worn into surgery.  Leave your suitcase in the car.  After surgery it may be brought to your room.  For patients admitted to the hospital, discharge time will be determined by your treatment team.  Patients discharged the day of surgery will not be allowed to drive home.   Name and phone number of your driver:   family Special instructions:  None  Please read over the following fact sheets that you were given. Anesthesia Post-op Instructions and Care and Recovery After Surgery       Colonoscopy, Adult, Care After This sheet gives you information about how to care for yourself after your procedure. Your health care provider may also give you more specific instructions. If you have problems or questions, contact your health care provider. What can I expect after the procedure? After the procedure, it is common to have:  A small amount of blood in your stool for 24 hours after the procedure.  Some gas.  Mild abdominal cramping or bloating. Follow these instructions at home: General instructions  For the first 24 hours after the procedure: ? Do not drive or use machinery. ? Do not sign important  documents. ? Do not drink alcohol. ? Do your regular daily activities at a slower pace than normal. ? Eat soft, easy-to-digest foods.  Take over-the-counter or prescription medicines only as told by your health care provider. Relieving cramping and bloating   Try walking around when you have cramps or feel bloated.  Apply heat to your abdomen as told by your health care provider. Use a heat source that your health care provider recommends, such as a moist heat pack or a heating pad. ? Place a towel between your skin and the heat source. ? Leave the heat on for 20-30 minutes. ? Remove the heat if your skin turns bright red. This is especially important if you are unable to feel pain, heat, or cold. You may have a greater risk of getting burned. Eating and drinking   Drink enough fluid to keep your urine pale yellow.  Resume your normal diet as instructed by your health care provider. Avoid heavy or fried foods that are hard to digest.  Avoid drinking alcohol for as long as instructed by your health care provider. Contact a health care provider if:  You have blood in your stool 2-3 days after the procedure. Get help right away if:  You have more than  a small spotting of blood in your stool.  You pass large blood clots in your stool.  Your abdomen is swollen.  You have nausea or vomiting.  You have a fever.  You have increasing abdominal pain that is not relieved with medicine. Summary  After the procedure, it is common to have a small amount of blood in your stool. You may also have mild abdominal cramping and bloating.  For the first 24 hours after the procedure, do not drive or use machinery, sign important documents, or drink alcohol.  Contact your health care provider if you have a lot of blood in your stool, nausea or vomiting, a fever, or increased abdominal pain. This information is not intended to replace advice given to you by your health care provider. Make sure  you discuss any questions you have with your health care provider. Document Released: 11/07/2003 Document Revised: 01/15/2017 Document Reviewed: 06/06/2015 Elsevier Interactive Patient Education  2019 [place], Care After These instructions provide you with information about caring for yourself after your procedure. Your health care provider may also give you more specific instructions. Your treatment has been planned according to current medical practices, but problems sometimes occur. Call your health care provider if you have any problems or questions after your procedure. What can I expect after the procedure? After your procedure, you may:  Feel sleepy for several hours.  Feel clumsy and have poor balance for several hours.  Feel forgetful about what happened after the procedure.  Have poor judgment for several hours.  Feel nauseous or vomit.  Have a sore throat if you had a breathing tube during the procedure. Follow these instructions at home: For at least 24 hours after the procedure:      Have a responsible adult stay with you. It is important to have someone help care for you until you are awake and alert.  Rest as needed.  Do not: ? Participate in activities in which you could fall or become injured. ? Drive. ? Use heavy machinery. ? Drink alcohol. ? Take sleeping pills or medicines that cause drowsiness. ? Make important decisions or sign legal documents. ? Take care of children on your own. Eating and drinking  Follow the diet that is recommended by your health care provider.  If you vomit, drink water, juice, or soup when you can drink without vomiting.  Make sure you have little or no nausea before eating solid foods. General instructions  Take over-the-counter and prescription medicines only as told by your health care provider.  If you have sleep apnea, surgery and certain medicines can increase your risk for breathing  problems. Follow instructions from your health care provider about wearing your sleep device: ? Anytime you are sleeping, including during daytime naps. ? While taking prescription pain medicines, sleeping medicines, or medicines that make you drowsy.  If you smoke, do not smoke without supervision.  Keep all follow-up visits as told by your health care provider. This is important. Contact a health care provider if:  You keep feeling nauseous or you keep vomiting.  You feel light-headed.  You develop a rash.  You have a fever. Get help right away if:  You have trouble breathing. Summary  For several hours after your procedure, you may feel sleepy and have poor judgment.  Have a responsible adult stay with you for at least 24 hours or until you are awake and alert. This information is not intended to replace advice given  to you by your health care provider. Make sure you discuss any questions you have with your health care provider. Document Released: 07/16/2015 Document Revised: 11/08/2016 Document Reviewed: 07/16/2015 Elsevier Interactive Patient Education  2019 [employer].

## 2018-07-07 ENCOUNTER — Encounter (HOSPITAL_COMMUNITY): Payer: Self-pay

## 2018-07-07 ENCOUNTER — Other Ambulatory Visit: Payer: Self-pay | Admitting: Family Medicine

## 2018-07-07 ENCOUNTER — Encounter (HOSPITAL_COMMUNITY)
Admission: RE | Admit: 2018-07-07 | Discharge: 2018-07-07 | Disposition: A | Discharge status: Home / Self Care | Payer: PPO | Source: Ambulatory Visit | Attending: Gastroenterology | Admitting: Gastroenterology

## 2018-07-07 ENCOUNTER — Other Ambulatory Visit: Payer: Self-pay

## 2018-07-07 DIAGNOSIS — Z01812 Encounter for preprocedural laboratory examination: Secondary | ICD-10-CM | POA: Insufficient documentation

## 2018-07-07 LAB — BASIC METABOLIC PANEL
Anion gap: 9 (ref 5–15)
BUN: 7 mg/dL — ABNORMAL LOW (ref 8–23)
CO2: 26 mmol/L (ref 22–32)
Calcium: 8.8 mg/dL — ABNORMAL LOW (ref 8.9–10.3)
Chloride: 105 mmol/L (ref 98–111)
Creatinine, Ser: 0.7 mg/dL (ref 0.44–1.00)
GFR calc Af Amer: >60 mL/min (ref >60)
GFR calc non Af Amer: >60 mL/min (ref >60)
Glucose, Bld: 146 mg/dL — ABNORMAL HIGH (ref 70–99)
Potassium: 4 mmol/L (ref 3.5–5.1)
Sodium: 140 mmol/L (ref 135–145)

## 2018-07-09 ENCOUNTER — Encounter (HOSPITAL_COMMUNITY): Payer: Self-pay | Admitting: Anesthesiology

## 2018-07-10 ENCOUNTER — Telehealth: Payer: Self-pay | Admitting: Gastroenterology

## 2018-07-10 NOTE — Telephone Encounter (Signed)
Called patient TO DISCUSS SYMPTOMS. SHE HAD HEME POSITIVE STOOLS IN NOV 2019 DURING AN EPISODE OF DIARRHEA. NOW HAVING NORMAL STOOLS. EXPLAINED [place] IS ASKING [place] TO [facility] PT THAT DO NOT HAVE AN URGENT NEED FOR COVID 19.  SHE HAS HAD TCS IN 2004 AND 2014. CURRENTLY NO DIARRHEA, BRBPR OR MELENA. PT VOICED HER UNDERSTANDING. SHE IS OK TO WAIT FOR TCS.  [facility] TCS FOR HEME POSITIVE IN 4-6 WEEKS.

## 2018-07-10 NOTE — Telephone Encounter (Signed)
Routing to clinical pool to reschedule

## 2018-07-13 NOTE — Telephone Encounter (Signed)
Spoke with patient and she is rescheduled to 5/19 at 9:45am. Patient aware will mail new instructions/pre-op appt.

## 2018-08-10 ENCOUNTER — Ambulatory Visit (INDEPENDENT_AMBULATORY_CARE_PROVIDER_SITE_OTHER): Payer: PPO | Admitting: Family Medicine

## 2018-08-10 ENCOUNTER — Other Ambulatory Visit: Payer: Self-pay

## 2018-08-10 DIAGNOSIS — D72829 Elevated white blood cell count, unspecified: Secondary | ICD-10-CM

## 2018-08-10 DIAGNOSIS — G609 Hereditary and idiopathic neuropathy, unspecified: Secondary | ICD-10-CM | POA: Diagnosis not present

## 2018-08-10 DIAGNOSIS — Z79899 Other long term (current) drug therapy: Secondary | ICD-10-CM | POA: Diagnosis not present

## 2018-08-10 DIAGNOSIS — K219 Gastro-esophageal reflux disease without esophagitis: Secondary | ICD-10-CM | POA: Diagnosis not present

## 2018-08-10 DIAGNOSIS — R569 Unspecified convulsions: Secondary | ICD-10-CM

## 2018-08-10 DIAGNOSIS — G4733 Obstructive sleep apnea (adult) (pediatric): Secondary | ICD-10-CM

## 2018-08-10 MED ORDER — POTASSIUM CHLORIDE CRYS ER 10 MEQ PO TBCR: 10.0000 meq | EXTENDED_RELEASE_TABLET | Freq: Two times a day (BID) | ORAL | 2 refills | Status: DC

## 2018-08-10 MED ORDER — PHENYTOIN SODIUM EXTENDED 100 MG PO CAPS: ORAL_CAPSULE | ORAL | 1 refills | Status: DC

## 2018-08-10 MED ORDER — DULOXETINE HCL 60 MG PO CPEP: ORAL_CAPSULE | ORAL | 2 refills | Status: DC

## 2018-08-10 MED ORDER — HYDROCHLOROTHIAZIDE 25 MG PO TABS: 25.0000 mg | ORAL_TABLET | Freq: Every day | ORAL | 1 refills | Status: DC

## 2018-08-10 MED ORDER — HYDROCODONE-ACETAMINOPHEN 10-325 MG PO TABS: ORAL_TABLET | ORAL | 0 refills | Status: DC

## 2018-08-10 MED ORDER — HYDROCODONE-ACETAMINOPHEN 10-325 MG PO TABS: 1.0000 | ORAL_TABLET | Freq: Three times a day (TID) | ORAL | 0 refills | Status: DC | PRN

## 2018-08-10 MED ORDER — PANTOPRAZOLE SODIUM 40 MG PO TBEC: 40.0000 mg | DELAYED_RELEASE_TABLET | Freq: Every day | ORAL | 1 refills | Status: DC

## 2018-08-10 NOTE — Addendum Note (Signed)
Addended by: Vicente Males on: 08/10/2018 02:56 PM   Modules accepted: Orders

## 2018-08-10 NOTE — Progress Notes (Signed)
Directions noted on med list

## 2018-08-10 NOTE — Progress Notes (Signed)
Subjective:    Patient ID: Deborah Carroll, female    DOB: Oct 23, 1955, 63 y.o.   MRN: [id number] Very nice patient Virtual visit HPI  Coronavirus outbreak made virtual visit necessary  Pt here for 3 month follow up. Pt states she has been doing ok except for being dizzy. Pt states she is dizzy when she lays down flat and then when she first stand up. Pt states that this has been going on for a while.  This patient was seen today for chronic pain  The medication list was reviewed and updated.   -Compliance with medication: Hydrocodone 10-325 one tablet three times daily prn pain  - Number patient states they take daily: 1 sometimes 2   -when was the last dose patient took? yesterday  The patient was advised the importance of maintaining medication and not using illegal substances with these.  Here for refills and follow up  The patient was educated that we can provide 3 monthly scripts for their medication, it is their responsibility to follow the instructions.  Side effects or complications from medications: none  Patient is aware that pain medications are meant to minimize the severity of the pain to allow their pain levels to improve to allow for better function. They are aware of that pain medications cannot totally remove their pain.  Due for UDT ( at least once per year) : none   Patient does have ongoing trouble with reflux.  Takes medication on a regular basis.  Tries to minimize foods as best they can.  They understand the importance of dietary compliance.  May also try to avoid eating a large meal close to bedtime.  Patient denies any dysphagia denies hematochezia.  States medicine does a good job keeping the problem under good control.  Without the medication may certainly have issues.They desire to continue taking their medication.  The patient's BMI is calculated.  The patient does have obesity.  The patient does try to some degree staying active and watching diet.  It is  in the vital signs and acknowledged.  It is above the recommended BMI for the patient's height and weight.  The patient has been counseled regarding healthy diet, restricted portions, avoiding excessive carbohydrates/sugary foods, and increase physical activity as health permits.  It is in the patient's best interest to lower the risk of secondary illness including heart disease strokes and cancer by losing weight.  The patient acknowledges this information.  Patient does have a history of seizures takes her medicine regular basis keeps under good control  Patient relates blood pressures been slightly elevated recently despite taking some Eiad intermittently we discussed various options and agreed upon hydrochlorothiazide again  Patient has had leukocytosis in the past nothing severe but it does need follow-up. Virtual Visit via Video Note  I connected with Deborah Carroll on 08/10/18 at  9:30 AM EDT by a video enabled telemedicine application and verified that I am speaking with the correct person using two identifiers.  Location: Patient: home Provider: office   I discussed the limitations of evaluation and management by telemedicine and the availability of in person appointments. The patient expressed understanding and agreed to proceed.  History of Present Illness:    Observations/Objective:   Assessment and Plan:   Follow Up Instructions:    I discussed the assessment and treatment plan with the patient. The patient was provided an opportunity to ask questions and all were answered. The patient agreed with the plan and demonstrated  an understanding of the instructions.   The patient was advised to call back or seek an in-person evaluation if the symptoms worsen or if the condition fails to improve as anticipated.  I provided 25 minutes of non-face-to-face time during this encounter.   Vicente Males, LPN    Review of Systems     Objective:   Physical Exam         Assessment & Plan:  The patient was seen in followup for chronic pain. A review over at their current pain status was discussed. Drug registry was checked. Prescriptions were given. Discussion was held regarding the importance of compliance with medication as well as pain medication contract.  Time for questions regarding pain management plan occurred. Importance of regular followup visits was discussed. Patient was informed that medication may cause drowsiness and should not be combined  with other medications/alcohol or street drugs. Patient was cautioned that medication could cause drowsiness. If the patient feels medication is causing altered alertness then do not drive or operate dangerous equipment.  Patient does have GERD she is under good control with medication will continue to medicine on a regular basis  Patient has a seizure disorder takes her medicine regular basis does not have any problems  Patient does have peripheral neuropathy for which the pain medicine is necessary she takes the Cymbalta on a regular basis and the hydrocodone she takes anywhere between 1 tablet and 3 tablets daily  Patient's blood pressure is been mildly elevated recently in the 140-150/90 range.  We will stop torsemide we will use HCTZ 25 mg daily  Patient will do lab work this coming summer and follow-up with [place] in September  Patient is trying to eat healthy eating a lot of salads staying physically active trying to lose weight to address her morbid obesity  Because of the minimal leukocytosis will repeat CBC

## 2018-08-13 NOTE — Telephone Encounter (Signed)
Per [facility] reschedule patient upcoming procedure. Called patient and is aware we will call her back to reschedule but we are currently cancelling procedure date for 5/19. She voiced understanding. Endo is aware

## 2018-08-19 ENCOUNTER — Other Ambulatory Visit (HOSPITAL_COMMUNITY): Payer: PPO

## 2018-08-25 ENCOUNTER — Ambulatory Visit (HOSPITAL_COMMUNITY): Admission: RE | Admit: 2018-08-25 | Payer: PPO | Source: Home / Self Care | Admitting: Gastroenterology

## 2018-08-25 ENCOUNTER — Encounter (HOSPITAL_COMMUNITY): Admission: RE | Payer: Self-pay | Source: Home / Self Care

## 2018-08-25 SURGERY — COLONOSCOPY WITH PROPOFOL
Anesthesia: Monitor Anesthesia Care

## 2018-09-03 ENCOUNTER — Telehealth: Payer: Self-pay | Admitting: *Deleted

## 2018-09-03 ENCOUNTER — Other Ambulatory Visit: Payer: Self-pay | Admitting: *Deleted

## 2018-09-03 DIAGNOSIS — R195 Other fecal abnormalities: Secondary | ICD-10-CM

## 2018-09-03 MED ORDER — PEG 3350-KCL-NA BICARB-NACL 420 G PO SOLR: 4000.0000 mL | Freq: Once | ORAL | 0 refills | Status: AC

## 2018-09-03 NOTE — Telephone Encounter (Signed)
Called patient and she is scheduled for TCS with [facility] on 11/17/2018 at 7:30am. Patient aware will mail new instructions and pre-op to her (confirmed address). I have also resent Rx in.

## 2018-10-08 ENCOUNTER — Other Ambulatory Visit: Payer: Self-pay | Admitting: Family Medicine

## 2018-11-09 NOTE — Patient Instructions (Addendum)
Deborah Carroll  11/09/2018     @PREFPERIOPPHARMACY @   Your procedure is scheduled on  11/17/2018.  Report to [facility] at  615  A.M.  Call this number if you have problems the morning of surgery:  [phone]   Remember:  Follow the diet and prep instructions given to you by Dr Nona Dell office.                      Take these medicines the morning of surgery with A SIP OF WATER  Duloxetine, hydrocodone(if needed), pantoprazole, dilantin.    Do not wear jewelry, make-up or nail polish.  Do not wear lotions, powders, or perfumes. Please brush your teeth and wear deodorant.  Do not shave 48 hours prior to surgery.  Men may shave face and neck.  Do not bring valuables to the hospital.  [facility] is not responsible for any belongings or valuables.  Contacts, dentures or bridgework may not be worn into surgery.  Leave your suitcase in the car.  After surgery it may be brought to your room.  For patients admitted to the hospital, discharge time will be determined by your treatment team.  Patients discharged the day of surgery will not be allowed to drive home.   Name and phone number of your driver:   family Special instructions:  None  Please read over the following fact sheets that you were given. Anesthesia Post-op Instructions and Care and Recovery After Surgery       Colonoscopy, Adult, Care After This sheet gives you information about how to care for yourself after your procedure. Your health care provider may also give you more specific instructions. If you have problems or questions, contact your health care provider. What can I expect after the procedure? After the procedure, it is common to have:  A small amount of blood in your stool for 24 hours after the procedure.  Some gas.  Mild abdominal cramping or bloating. Follow these instructions at home: General instructions  For the first 24 hours after the procedure: ? Do not drive or use machinery.  ? Do not sign important documents. ? Do not drink alcohol. ? Do your regular daily activities at a slower pace than normal. ? Eat soft, easy-to-digest foods.  Take over-the-counter or prescription medicines only as told by your health care provider. Relieving cramping and bloating   Try walking around when you have cramps or feel bloated.  Apply heat to your abdomen as told by your health care provider. Use a heat source that your health care provider recommends, such as a moist heat pack or a heating pad. ? Place a towel between your skin and the heat source. ? Leave the heat on for 20-30 minutes. ? Remove the heat if your skin turns bright red. This is especially important if you are unable to feel pain, heat, or cold. You may have a greater risk of getting burned. Eating and drinking   Drink enough fluid to keep your urine pale yellow.  Resume your normal diet as instructed by your health care provider. Avoid heavy or fried foods that are hard to digest.  Avoid drinking alcohol for as long as instructed by your health care provider. Contact a health care provider if:  You have blood in your stool 2-3 days after the procedure. Get help right away if:  You have more than a small spotting of blood in your stool.  You pass large blood clots in your stool.  Your abdomen is swollen.  You have nausea or vomiting.  You have a fever.  You have increasing abdominal pain that is not relieved with medicine. Summary  After the procedure, it is common to have a small amount of blood in your stool. You may also have mild abdominal cramping and bloating.  For the first 24 hours after the procedure, do not drive or use machinery, sign important documents, or drink alcohol.  Contact your health care provider if you have a lot of blood in your stool, nausea or vomiting, a fever, or increased abdominal pain. This information is not intended to replace advice given to you by your health  care provider. Make sure you discuss any questions you have with your health care provider. Document Released: 11/07/2003 Document Revised: 01/15/2017 Document Reviewed: 06/06/2015 Elsevier Patient Education  2020 [employer].

## 2018-11-13 ENCOUNTER — Encounter (HOSPITAL_COMMUNITY): Payer: Self-pay

## 2018-11-13 ENCOUNTER — Encounter (HOSPITAL_COMMUNITY)
Admission: RE | Admit: 2018-11-13 | Discharge: 2018-11-13 | Disposition: A | Discharge status: Home / Self Care | Payer: PPO | Source: Ambulatory Visit | Attending: Gastroenterology | Admitting: Gastroenterology

## 2018-11-13 ENCOUNTER — Other Ambulatory Visit (HOSPITAL_COMMUNITY)
Admission: RE | Admit: 2018-11-13 | Discharge: 2018-11-13 | Disposition: A | Discharge status: Home / Self Care | Payer: PPO | Source: Ambulatory Visit | Attending: Gastroenterology | Admitting: Gastroenterology

## 2018-11-13 ENCOUNTER — Other Ambulatory Visit: Payer: Self-pay

## 2018-11-13 DIAGNOSIS — Z20828 Contact with and (suspected) exposure to other viral communicable diseases: Secondary | ICD-10-CM | POA: Insufficient documentation

## 2018-11-13 DIAGNOSIS — Z01812 Encounter for preprocedural laboratory examination: Secondary | ICD-10-CM | POA: Diagnosis not present

## 2018-11-13 DIAGNOSIS — G4733 Obstructive sleep apnea (adult) (pediatric): Secondary | ICD-10-CM | POA: Diagnosis not present

## 2018-11-13 HISTORY — DX: Polyneuropathy, unspecified: G62.9

## 2018-11-13 LAB — CBC WITH DIFFERENTIAL/PLATELET
Abs Immature Granulocytes: 0.1 10*3/uL — ABNORMAL HIGH (ref 0.00–0.07)
Basophils Absolute: 0 10*3/uL (ref 0.0–0.1)
Basophils Relative: 0 %
Eosinophils Absolute: 0.2 10*3/uL (ref 0.0–0.5)
Eosinophils Relative: 2 %
HCT: 39.9 % (ref 36.0–46.0)
Hemoglobin: 12.9 g/dL (ref 12.0–15.0)
Immature Granulocytes: 1 %
Lymphocytes Relative: 25 %
Lymphs Abs: 2.8 10*3/uL (ref 0.7–4.0)
MCH: 28.8 pg (ref 26.0–34.0)
MCHC: 32.3 g/dL (ref 30.0–36.0)
MCV: 89.1 fL (ref 80.0–100.0)
Monocytes Absolute: 0.5 10*3/uL (ref 0.1–1.0)
Monocytes Relative: 5 %
Neutro Abs: 7.5 10*3/uL (ref 1.7–7.7)
Neutrophils Relative %: 67 %
Platelets: 196 10*3/uL (ref 150–400)
RBC: 4.48 MIL/uL (ref 3.87–5.11)
RDW: 13.2 % (ref 11.5–15.5)
WBC: 11.2 10*3/uL — ABNORMAL HIGH (ref 4.0–10.5)
nRBC: 0 % (ref 0.0–0.2)

## 2018-11-13 LAB — BASIC METABOLIC PANEL
Anion gap: 11 (ref 5–15)
BUN: 10 mg/dL (ref 8–23)
CO2: 27 mmol/L (ref 22–32)
Calcium: 8.7 mg/dL — ABNORMAL LOW (ref 8.9–10.3)
Chloride: 99 mmol/L (ref 98–111)
Creatinine, Ser: 0.72 mg/dL (ref 0.44–1.00)
GFR calc Af Amer: >60 mL/min (ref >60)
GFR calc non Af Amer: >60 mL/min (ref >60)
Glucose, Bld: 224 mg/dL — ABNORMAL HIGH (ref 70–99)
Potassium: 3.5 mmol/L (ref 3.5–5.1)
Sodium: 137 mmol/L (ref 135–145)

## 2018-11-13 LAB — SARS CORONAVIRUS 2 (TAT 6-24 HRS): SARS Coronavirus 2: NEGATIVE

## 2018-11-17 ENCOUNTER — Other Ambulatory Visit: Payer: Self-pay

## 2018-11-17 ENCOUNTER — Ambulatory Visit (HOSPITAL_COMMUNITY)
Admission: RE | Admit: 2018-11-17 | Discharge: 2018-11-17 | Disposition: A | Discharge status: Home / Self Care | Payer: PPO | Attending: Gastroenterology | Admitting: Gastroenterology

## 2018-11-17 ENCOUNTER — Encounter (HOSPITAL_COMMUNITY): Payer: Self-pay

## 2018-11-17 ENCOUNTER — Ambulatory Visit (HOSPITAL_COMMUNITY): Payer: PPO | Admitting: Anesthesiology

## 2018-11-17 ENCOUNTER — Encounter (HOSPITAL_COMMUNITY)
Admission: RE | Disposition: A | Discharge status: Home / Self Care | Payer: Self-pay | Source: Home / Self Care | Attending: Gastroenterology

## 2018-11-17 DIAGNOSIS — Z79899 Other long term (current) drug therapy: Secondary | ICD-10-CM | POA: Diagnosis not present

## 2018-11-17 DIAGNOSIS — K648 Other hemorrhoids: Secondary | ICD-10-CM | POA: Insufficient documentation

## 2018-11-17 DIAGNOSIS — F329 Major depressive disorder, single episode, unspecified: Secondary | ICD-10-CM | POA: Insufficient documentation

## 2018-11-17 DIAGNOSIS — G8929 Other chronic pain: Secondary | ICD-10-CM | POA: Diagnosis not present

## 2018-11-17 DIAGNOSIS — K621 Rectal polyp: Secondary | ICD-10-CM

## 2018-11-17 DIAGNOSIS — K644 Residual hemorrhoidal skin tags: Secondary | ICD-10-CM | POA: Diagnosis not present

## 2018-11-17 DIAGNOSIS — D123 Benign neoplasm of transverse colon: Secondary | ICD-10-CM | POA: Diagnosis not present

## 2018-11-17 DIAGNOSIS — K219 Gastro-esophageal reflux disease without esophagitis: Secondary | ICD-10-CM | POA: Insufficient documentation

## 2018-11-17 DIAGNOSIS — K635 Polyp of colon: Secondary | ICD-10-CM

## 2018-11-17 DIAGNOSIS — D122 Benign neoplasm of ascending colon: Secondary | ICD-10-CM | POA: Diagnosis not present

## 2018-11-17 DIAGNOSIS — Z6841 Body Mass Index (BMI) 40.0 and over, adult: Secondary | ICD-10-CM | POA: Diagnosis not present

## 2018-11-17 DIAGNOSIS — Z8371 Family history of colonic polyps: Secondary | ICD-10-CM | POA: Diagnosis not present

## 2018-11-17 DIAGNOSIS — G629 Polyneuropathy, unspecified: Secondary | ICD-10-CM | POA: Insufficient documentation

## 2018-11-17 DIAGNOSIS — Q438 Other specified congenital malformations of intestine: Secondary | ICD-10-CM | POA: Insufficient documentation

## 2018-11-17 DIAGNOSIS — I10 Essential (primary) hypertension: Secondary | ICD-10-CM | POA: Diagnosis not present

## 2018-11-17 DIAGNOSIS — R195 Other fecal abnormalities: Secondary | ICD-10-CM | POA: Diagnosis not present

## 2018-11-17 DIAGNOSIS — F419 Anxiety disorder, unspecified: Secondary | ICD-10-CM | POA: Insufficient documentation

## 2018-11-17 DIAGNOSIS — R569 Unspecified convulsions: Secondary | ICD-10-CM | POA: Diagnosis not present

## 2018-11-17 DIAGNOSIS — G4730 Sleep apnea, unspecified: Secondary | ICD-10-CM | POA: Insufficient documentation

## 2018-11-17 HISTORY — PX: POLYPECTOMY: SHX5525

## 2018-11-17 HISTORY — PX: COLONOSCOPY WITH PROPOFOL: SHX5780

## 2018-11-17 SURGERY — COLONOSCOPY WITH PROPOFOL
Anesthesia: General

## 2018-11-17 MED ORDER — KETAMINE HCL 50 MG/5ML IJ SOSY
PREFILLED_SYRINGE | INTRAMUSCULAR | Status: AC
  Filled 2018-11-17: qty 5

## 2018-11-17 MED ORDER — PROPOFOL 500 MG/50ML IV EMUL
INTRAVENOUS | Status: DC | PRN
  Administered 2018-11-17: 75 ug/kg/min via INTRAVENOUS
  Administered 2018-11-17: 150 ug/kg/min via INTRAVENOUS

## 2018-11-17 MED ORDER — MIDAZOLAM HCL 2 MG/2ML IJ SOLN: 0.5000 mg | Freq: Once | INTRAMUSCULAR | Status: DC | PRN

## 2018-11-17 MED ORDER — HYDROCODONE-ACETAMINOPHEN 7.5-325 MG PO TABS: 1.0000 | ORAL_TABLET | Freq: Once | ORAL | Status: DC | PRN

## 2018-11-17 MED ORDER — PROPOFOL 10 MG/ML IV BOLUS
INTRAVENOUS | Status: DC | PRN
  Administered 2018-11-17 (×3): 10 mg via INTRAVENOUS

## 2018-11-17 MED ORDER — GLYCOPYRROLATE 0.2 MG/ML IJ SOLN
INTRAMUSCULAR | Status: DC | PRN
  Administered 2018-11-17: 0.2 mg via INTRAVENOUS

## 2018-11-17 MED ORDER — LACTATED RINGERS IV SOLN
INTRAVENOUS | Status: DC
  Administered 2018-11-17: 07:00:00 via INTRAVENOUS

## 2018-11-17 MED ORDER — CHLORHEXIDINE GLUCONATE CLOTH 2 % EX PADS: 6.0000 | MEDICATED_PAD | Freq: Once | CUTANEOUS | Status: DC

## 2018-11-17 MED ORDER — KETAMINE HCL 10 MG/ML IJ SOLN
INTRAMUSCULAR | Status: DC | PRN
  Administered 2018-11-17: 10 mg via INTRAVENOUS

## 2018-11-17 MED ORDER — PROMETHAZINE HCL 25 MG/ML IJ SOLN: 6.2500 mg | INTRAMUSCULAR | Status: DC | PRN

## 2018-11-17 MED ORDER — HYDROMORPHONE HCL 1 MG/ML IJ SOLN: 0.2500 mg | INTRAMUSCULAR | Status: DC | PRN

## 2018-11-17 MED ORDER — LIDOCAINE HCL (CARDIAC) PF 100 MG/5ML IV SOSY
PREFILLED_SYRINGE | INTRAVENOUS | Status: DC | PRN
  Administered 2018-11-17: 40 mg via INTRAVENOUS

## 2018-11-17 NOTE — Anesthesia Procedure Notes (Signed)
Procedure Name: General with mask airway Date/Time: 11/17/2018 7:29 AM Performed by: Andree Elk, Romey Mathieson A, CRNA Pre-anesthesia Checklist: Patient identified, Emergency Drugs available, Suction available, Patient being monitored and Timeout performed Oxygen Delivery Method: Simple face mask Comments: Oxymask trial

## 2018-11-17 NOTE — Anesthesia Preprocedure Evaluation (Signed)
Anesthesia Evaluation  Patient identified by MRN, date of birth, ID band Patient awake    Reviewed: Allergy & Precautions, NPO status , Patient's Chart, lab work & pertinent test results  Airway Mallampati: II  TM Distance: >3 FB Neck ROM: Full    Dental no notable dental hx. (+) Teeth Intact   Pulmonary sleep apnea and Oxygen sleep apnea ,    Pulmonary exam normal breath sounds clear to auscultation       Cardiovascular Exercise Tolerance: Good hypertension, Pt. on medications negative cardio ROS Normal cardiovascular examI Rhythm:Regular Rate:Normal     Neuro/Psych Seizures -, Well Controlled,  PSYCHIATRIC DISORDERS Anxiety Depression  Neuromuscular disease    GI/Hepatic Neg liver ROS, GERD  Medicated and Controlled,  Endo/Other  Morbid obesityBMI>43  Renal/GU negative Renal ROS  negative genitourinary   Musculoskeletal negative musculoskeletal ROS (+)   Abdominal   Peds negative pediatric ROS (+)  Hematology negative hematology ROS (+)   Anesthesia Other Findings Chronic pain issues - on meds  Reproductive/Obstetrics negative OB ROS                             Anesthesia Physical Anesthesia Plan  ASA: III  Anesthesia Plan: General   Post-op Pain Management:    Induction: Intravenous  PONV Risk Score and Plan: 3 and Propofol infusion, TIVA, Treatment may vary due to age or medical condition and Ondansetron  Airway Management Planned: Simple Face Mask  Additional Equipment:   Intra-op Plan:   Post-operative Plan:   Informed Consent: I have reviewed the patients History and Physical, chart, labs and discussed the procedure including the risks, benefits and alternatives for the proposed anesthesia with the patient or authorized representative who has indicated his/her understanding and acceptance.     Dental advisory given  Plan Discussed with: CRNA  Anesthesia Plan  Comments: (Plan full PPE use  Plan GA with GETa as needed -d/w pt -WTP with same after Q&A)        Anesthesia Quick Evaluation

## 2018-11-17 NOTE — Anesthesia Postprocedure Evaluation (Signed)
Anesthesia Post Note  Patient: Deborah Carroll  Procedure(s) Performed: COLONOSCOPY WITH PROPOFOL (N/A ) POLYPECTOMY  Patient location during evaluation: PACU Anesthesia Type: General Level of consciousness: awake and alert, oriented and patient cooperative Pain management: pain level controlled Vital Signs Assessment: post-procedure vital signs reviewed and stable Respiratory status: spontaneous breathing Cardiovascular status: stable Postop Assessment: no apparent nausea or vomiting Anesthetic complications: no     Last Vitals:  Vitals:   11/17/18 0652 11/17/18 0824  BP: (!) 154/77 124/79  Pulse: 73 67  Resp: 15 12  Temp: 36.8 C 36.7 C  SpO2: 96% 98%    Last Pain:  Vitals:   11/17/18 0824  TempSrc:   PainSc: 0-No pain                 ADAMS, AMY A

## 2018-11-17 NOTE — H&P (Signed)
Primary Care Physician:  Kathyrn Drown, MD Primary Gastroenterologist:  Dr. Oneida Alar  Pre-Procedure History & Physical: HPI:  Deborah Carroll is a 63 y.o. female here for   Past Medical History:  Diagnosis Date  . Anxiety   . Chronic pain   . Depression   . Hypertension   . Morbid obesity ([place]) 03/20/2018  . Neuropathy   . Peripheral neuropathy   . Seizures ([place])    last one 1991- Increased BP with delivery and is on meds still.  . Sleep apnea    CPAP    Past Surgical History:  Procedure Laterality Date  . ABDOMINAL HYSTERECTOMY     ovaries present, no cancer,approx 2001  . BACK SURGERY    . CARPAL TUNNEL RELEASE     Bilateral  . CATARACT EXTRACTION W/PHACO Right 06/20/2015   Procedure: CATARACT EXTRACTION PHACO AND INTRAOCULAR LENS PLACEMENT (IOC);  Surgeon: Rutherford Guys, MD;  Location: [facility];  Service: Ophthalmology;  Laterality: Right;  CDE:6.25  . CATARACT EXTRACTION W/PHACO Left 08/29/2015   Procedure: CATARACT EXTRACTION PHACO AND INTRAOCULAR LENS PLACEMENT (IOC);  Surgeon: Rutherford Guys, MD;  Location: [facility];  Service: Ophthalmology;  Laterality: Left;  CDE: 4.11  . CESAREAN SECTION     x2  . CHOLECYSTECTOMY    . COLONOSCOPY  10/14/2002   HFW:YOVZCHYIFO involving descending colon, splenic flexure and distal transverse colon with most extensive inflammation in the splenic flexure area.  These changes are typical of ischemic colitis.  Biopsy taken.  . COLONOSCOPY N/A 08/04/2012   SLF: POLYPOID LESION IN AT THE APPENDICEAL ORIFICE/Polyp  in the descending colon/Moderate melanosis throughout the entire examined colon/ The colon IS redundant. small tubular adenoma.   Marland Kitchen FOOT SURGERY Bilateral 2004   hammer toe repairs  . PARS PLANA VITRECTOMY W/ REPAIR OF MACULAR HOLE      Prior to Admission medications   Medication Sig Start Date End Date Taking? Authorizing Provider  DULoxetine (CYMBALTA) 60 MG capsule TAKE 1 CAPSULE(60 MG) BY MOUTH DAILY Patient taking differently:  Take 60 mg by mouth daily.  10/08/18  Yes Kathyrn Drown, MD  hydrochlorothiazide (HYDRODIURIL) 25 MG tablet Take 1 tablet (25 mg total) by mouth daily. 08/10/18  Yes Kathyrn Drown, MD  HYDROcodone-acetaminophen (NORCO) 10-325 MG tablet Take 1 tablet by mouth 3 (three) times daily as needed for moderate pain. 08/10/18  Yes Luking, Elayne Snare, MD  pantoprazole (PROTONIX) 40 MG tablet Take 1 tablet (40 mg total) by mouth daily. 08/10/18  Yes Kathyrn Drown, MD  phenytoin (DILANTIN) 100 MG ER capsule Take 2 capsule by mouth in the morning and 2 capsules by mouth in the evening. Patient taking differently: Take 200 mg by mouth 2 (two) times daily.  08/10/18  Yes Kathyrn Drown, MD  potassium chloride (K-DUR) 10 MEQ tablet Take 1 tablet (10 mEq total) by mouth 2 (two) times daily. Patient taking differently: Take 10 mEq by mouth daily as needed (when taking Torsemide).  08/10/18  Yes Kathyrn Drown, MD  torsemide (DEMADEX) 20 MG tablet Take 20 mg by mouth daily as needed (fluid or swelling).   Yes [provider]  HYDROcodone-acetaminophen (NORCO) 10-325 MG tablet Take 1 tablet by mouth 3 (three) times daily as needed for moderate pain Patient not taking: Reported on 11/11/2018 08/10/18   Kathyrn Drown, MD  HYDROcodone-acetaminophen (NORCO) 10-325 MG tablet Take 1 tablet by mouth 3 (three) times daily as needed for moderate pain Patient not taking: Reported on  11/11/2018 08/10/18   Kathyrn Drown, MD    Allergies as of 09/03/2018  . (No Known Allergies)    Family History  Problem Relation Age of Onset  . Colon polyps Mother 83       benign   . Hypertension Maternal Grandmother   . Heart attack Maternal Grandfather   . Colon cancer Neg Hx   . Liver disease Neg Hx     Social History   Socioeconomic History  . Marital status: Married    Spouse name: Not on file  . Number of children: 2  . Years of education: Not on file  . Highest education level: Not on file  Occupational History  .  Occupation: disability  Social Needs  . Financial resource strain: Not on file  . Food insecurity    Worry: Not on file    Inability: Not on file  . Transportation needs    Medical: Not on file    Non-medical: Not on file  Tobacco Use  . Smoking status: Never Smoker  . Smokeless tobacco: Never Used  Substance and Sexual Activity  . Alcohol use: Yes    Comment: rare  . Drug use: No  . Sexual activity: Yes    Birth control/protection: Surgical  Lifestyle  . Physical activity    Days per week: Not on file    Minutes per session: Not on file  . Stress: Not on file  Relationships  . Social Herbalist on phone: Not on file    Gets together: Not on file    Attends religious service: Not on file    Active member of club or organization: Not on file    Attends meetings of clubs or organizations: Not on file    Relationship status: Not on file  . Intimate partner violence    Fear of current or ex partner: Not on file    Emotionally abused: Not on file    Physically abused: Not on file    Forced sexual activity: Not on file  Other Topics Concern  . Not on file  Social History Narrative  . Not on file    Review of Systems: See HPI, otherwise negative ROS   Physical Exam: BP (!) 154/77   Pulse 73   Temp 98.2 F (36.8 C) (Oral)   Resp 15   SpO2 96%  General:   Alert,  pleasant and cooperative in NAD Head:  Normocephalic and atraumatic. Neck:  Supple; Lungs:  Clear throughout to auscultation.    Heart:  Regular rate and rhythm. Abdomen:  Soft, nontender and nondistended. Normal bowel sounds, without guarding, and without rebound.   Neurologic:  Alert and  oriented x4;  grossly normal neurologically.  Impression/Plan:     HEME POS STOOLS  PLAN:  1.TCS TODAY. DISCUSSED PROCEDURE, BENEFITS, & RISKS: < 1% chance of medication reaction, bleeding, perforation, ASPIRATION, or rupture of spleen/liver requiring surgery to fix it and missed polyps < 1 cm 10-20% of  the time.

## 2018-11-17 NOTE — Op Note (Signed)
[facility] Patient Name: Deborah Carroll Procedure Date: 11/17/2018 7:30 AM MRN: [id number] Date of Birth: 1956-01-14 Attending MD: Barney Drain MD, MD CSN: [id number] Age: 63 Admit Type: Outpatient Procedure:                Colonoscopy WITH COLD SNARE/SNARE CAUTERY                            POLYPECTOMY Indications:              Heme positive stool Providers:                Barney Drain MD, MD, Charlsie Quest. Joanne Gavel, RN,                            Randa Spike, Technician Referring MD:             Elayne Snare. Luking Medicines:                Propofol per Anesthesia Complications:            No immediate complications. Estimated Blood Loss:     Estimated blood loss was minimal. Procedure:                Pre-Anesthesia Assessment:                           - Prior to the procedure, a History and Physical                            was performed, and patient medications and                            allergies were reviewed. The patient's tolerance of                            previous anesthesia was also reviewed. The risks                            and benefits of the procedure and the sedation                            options and risks were discussed with the patient.                            All questions were answered, and informed consent                            was obtained. Prior Anticoagulants: The patient has                            taken no previous anticoagulant or antiplatelet                            agents. ASA Grade Assessment: II - A patient with  mild systemic disease. After reviewing the risks                            and benefits, the patient was deemed in                            satisfactory condition to undergo the procedure.                            After obtaining informed consent, the colonoscope                            was passed under direct vision. Throughout the                            procedure, the  patient's blood pressure, pulse, and                            oxygen saturations were monitored continuously. The                            [id number]-[id number] ([id number]) scope was introduced through                            the anus and advanced to the the cecum, identified                            by appendiceal orifice and ileocecal valve. The                            colonoscopy was somewhat difficult due to a                            tortuous colon. Successful completion of the                            procedure was aided by straightening and shortening                            the scope to obtain bowel loop reduction and                            COLOWRAP. The patient tolerated the procedure well.                            The quality of the bowel preparation was good. The                            ileocecal valve, appendiceal orifice, and rectum                            were photographed. Scope In: 7:42:35 AM Scope Out: 8:18:25 AM Scope Withdrawal Time: 0 hours 33 minutes 53 seconds  Total Procedure  Duration: 0 hours 35 minutes 50 seconds  Findings:      Two sessile polyps were found in the proximal transverse colon and       proximal ascending colon. The polyps were 10 to 11 mm in size. These       polyps were removed with a hot snare. Resection and retrieval were       complete.      Three sessile polyps were found in the rectum and proximal ascending       colon. The polyps were 3 to 4 mm in size. These polyps were removed with       a cold snare. Resection and retrieval were complete.      External and internal hemorrhoids were found. The hemorrhoids were small.      The recto-sigmoid colon and sigmoid colon were mildly tortuous. Impression:               - Two 10 to 11 mm polyps in the proximal transverse                            colon and in the proximal ascending colon, removed                            with a hot snare. Resected and retrieved.                            - Three 3 to 4 mm polyps in the rectum(2) and in                            the proximal ascending colon, removed with a cold                            snare. Resected and retrieved.                           - External and internal hemorrhoids.                           - Tortuous LEFT colon. Moderate Sedation:      Per Anesthesia Care Recommendation:           - Patient has a contact number available for                            emergencies. The signs and symptoms of potential                            delayed complications were discussed with the                            patient. Return to normal activities tomorrow.                            Written discharge instructions were provided to the                            patient.                           -  High fiber diet. LOSE WEIGHT TO BMI < 30                            FOLLOWING RECOMMENDATION IN "THE 10 DAY DETOX                            DIET", DR. MARK HYMAN.                           - Continue present medications.                           - Await pathology results.                           - Repeat colonoscopy 1-3 YEARS for surveillance. Procedure Code(s):        --- Professional ---                           [phone], Colonoscopy, flexible; with removal of                            tumor(s), polyp(s), or other lesion(s) by snare                            technique Diagnosis Code(s):        --- Professional ---                           K62.1, Rectal polyp                           K63.5, Polyp of colon                           K64.8, Other hemorrhoids                           R19.5, Other fecal abnormalities                           Q43.8, Other specified congenital malformations of                            intestine CPT copyright 2019 American Medical Association. All rights reserved. The codes documented in this report are preliminary and upon coder review may  be revised to meet current  compliance requirements. Barney Drain, MD Barney Drain MD, MD 11/17/2018 8:34:38 AM This report has been signed electronically. Number of Addenda: 0

## 2018-11-17 NOTE — Transfer of Care (Signed)
Immediate Anesthesia Transfer of Care Note  Patient: Deborah Carroll  Procedure(s) Performed: COLONOSCOPY WITH PROPOFOL (N/A ) POLYPECTOMY  Patient Location: PACU  Anesthesia Type:General  Level of Consciousness: awake, alert , oriented and patient cooperative  Airway & Oxygen Therapy: Patient Spontanous Breathing  Post-op Assessment: Report given to RN and Post -op Vital signs reviewed and stable  Post vital signs: Reviewed and stable  Last Vitals:  Vitals Value Taken Time  BP 124/79 11/17/18 0825  Temp 36.7 C 11/17/18 0824  Pulse 65 11/17/18 0827  Resp 16 11/17/18 0827  SpO2 97 % 11/17/18 0827  Vitals shown include unvalidated device data.  Last Pain:  Vitals:   11/17/18 0824  TempSrc:   PainSc: 0-No pain         Complications: No apparent anesthesia complications

## 2018-11-17 NOTE — Discharge Instructions (Signed)
You have small internal AND EXTERNAL HEMORRHOIDS. YOU HAD FIVE POLYPS REMOVED.   DRINK WATER TO KEEP YOUR URINE LIGHT YELLOW.  CONTINUE YOUR WEIGHT LOSS EFFORTS. YOUR BODY MASS INDEX (BMI) IS OVER 40 WHICH MEANS YOU ARE MORBIDLY OBESE. OBESITY ACTIVATES CANCER GENES. MORBID OBESITY SHORTENS YOUR LIFE EXPECTANCY 10 YEARS. OBESITY IS ASSOCIATED WITH AN INCREASE RISK FOR CIRRHOSIS, COLON POLYPS, AND ALL CANCERS, INCLUDING ESOPHAGEAL AND COLON CANCER. I RECOMMEND YOU READ AND FOLLOW RECOMMENDATIONS of DR. MARK HYMAN, "10-DAY DETOX DIET".  OTHERWISE, FOLLOW A HIGH FIBER DIET. AVOID ITEMS THAT CAUSE BLOATING. See info below.   MEATS SHOULD BE BAKED, BROILED, OR BOILED. Avoid fried foods. DO NOT EAT FAST FOOD. Do not drink SODA, ENERGY DRINKS OR DIET SODA. AVOID HIGH FRUCTOSE CORN SYRUP. DO NOT chew SUGAR FREE GUM, OR USE ARTIFICIAL SWEETENERS. USE STEVIA AS A SWEETENER.   YOUR BIOPSY RESULTS WILL BE BACK IN 5 BUSINESS DAYS.  Next colonoscopy in 1-3 years.  Colonoscopy Care After Read the instructions outlined below and refer to this sheet in the next week. These discharge instructions provide you with general information on caring for yourself after you leave the hospital. While your treatment has been planned according to the most current medical practices available, unavoidable complications occasionally occur. If you have any problems or questions after discharge, call DR. Nichola Warren, [phone].  ACTIVITY  You may resume your regular activity, but move at a slower pace for the next 24 hours.   Take frequent rest periods for the next 24 hours.   Walking will help get rid of the air and reduce the bloated feeling in your belly (abdomen).   No driving for 24 hours (because of the medicine (anesthesia) used during the test).   You may shower.   Do not sign any important legal documents or operate any machinery for 24 hours (because of the anesthesia used during the test).     NUTRITION  Drink plenty of fluids.   You may resume your normal diet as instructed by your doctor.   Begin with a light meal and progress to your normal diet. Heavy or fried foods are harder to digest and may make you feel sick to your stomach (nauseated).   Avoid alcoholic beverages for 24 hours or as instructed.    MEDICATIONS  You may resume your normal medications.   WHAT YOU CAN EXPECT TODAY  Some feelings of bloating in the abdomen.   Passage of more gas than usual.   Spotting of blood in your stool or on the toilet paper  .  IF YOU HAD POLYPS REMOVED DURING THE COLONOSCOPY:  Eat a soft diet IF YOU HAVE NAUSEA, BLOATING, ABDOMINAL PAIN, OR VOMITING.    FINDING OUT THE RESULTS OF YOUR TEST Not all test results are available during your visit. DR. Oneida Alar WILL CALL YOU WITHIN 14 DAYS OF YOUR PROCEDUE WITH YOUR RESULTS. Do not assume everything is normal if you have not heard from DR. Amos Micheals, CALL HER OFFICE AT [phone].  SEEK IMMEDIATE MEDICAL ATTENTION AND CALL THE OFFICE: [phone] IF:  You have more than a spotting of blood in your stool.   Your belly is swollen (abdominal distention).   You are nauseated or vomiting.   You have a temperature over 101F.   You have abdominal pain or discomfort that is severe or gets worse throughout the day.  High-Fiber Diet A high-fiber diet changes your normal diet to include more whole grains, legumes, fruits, and vegetables. Changes in  the diet involve replacing refined carbohydrates with unrefined foods. The calorie level of the diet is essentially unchanged. The Dietary Reference Intake (recommended amount) for adult males is 38 grams per day. For adult females, it is 25 grams per day. Pregnant and lactating women should consume 28 grams of fiber per day. Fiber is the intact part of a plant that is not broken down during digestion. Functional fiber is fiber that has been isolated from the plant to provide a  beneficial effect in the body.  PURPOSE  Increase stool bulk.   Ease and regulate bowel movements.   Lower cholesterol.   REDUCE RISK OF COLON CANCER  INDICATIONS THAT YOU NEED MORE FIBER  Constipation and hemorrhoids.   Uncomplicated diverticulosis (intestine condition) and irritable bowel syndrome.   Weight management.   As a protective measure against hardening of the arteries (atherosclerosis), diabetes, and cancer.   GUIDELINES FOR INCREASING FIBER IN THE DIET  Start adding fiber to the diet slowly. A gradual increase of about 5 more grams (2 slices of whole-wheat bread, 2 servings of most fruits or vegetables, or 1 bowl of high-fiber cereal) per day is best. Too rapid an increase in fiber may result in constipation, flatulence, and bloating.   Drink enough water and fluids to keep your urine clear or pale yellow. Water, juice, or caffeine-free drinks are recommended. Not drinking enough fluid may cause constipation.   Eat a variety of high-fiber foods rather than one type of fiber.   Try to increase your intake of fiber through using high-fiber foods rather than fiber pills or supplements that contain small amounts of fiber.   The goal is to change the types of food eaten. Do not supplement your present diet with high-fiber foods, but replace foods in your present diet.   INCLUDE A VARIETY OF FIBER SOURCES  Replace refined and processed grains with whole grains, canned fruits with fresh fruits, and incorporate other fiber sources. White rice, white breads, and most bakery goods contain little or no fiber.   Brown whole-grain rice, buckwheat oats, and many fruits and vegetables are all good sources of fiber. These include: broccoli, Brussels sprouts, cabbage, cauliflower, beets, sweet potatoes, white potatoes (skin on), carrots, tomatoes, eggplant, squash, berries, fresh fruits, and dried fruits.   Cereals appear to be the richest source of fiber. Cereal fiber is found in  whole grains and bran. Bran is the fiber-rich outer coat of cereal grain, which is largely removed in refining. In whole-grain cereals, the bran remains. In breakfast cereals, the largest amount of fiber is found in those with "bran" in their names. The fiber content is sometimes indicated on the label.   You may need to include additional fruits and vegetables each day.   In baking, for 1 cup white flour, you may use the following substitutions:   1 cup whole-wheat flour minus 2 tablespoons.   1/2 cup white flour plus 1/2 cup whole-wheat flour.   Polyps, Colon  A polyp is extra tissue that grows inside your body. Colon polyps grow in the large intestine. The large intestine, also called the colon, is part of your digestive system. It is a long, hollow tube at the end of your digestive tract where your body makes and stores stool. Most polyps are not dangerous. They are benign. This means they are not cancerous. But over time, some types of polyps can turn into cancer. Polyps that are smaller than a pea are usually not harmful. But larger polyps  could someday become or may already be cancerous. To be safe, doctors remove all polyps and test them.   WHO GETS POLYPS? Anyone can get polyps, but certain people are more likely than others. You may have a greater chance of getting polyps if:  You are over 50.   You have had polyps before.   Someone in your family has had polyps.   Someone in your family has had cancer of the large intestine.   Find out if someone in your family has had polyps. You may also be more likely to get polyps if you:   Eat a lot of fatty foods   Smoke   Drink alcohol   Do not exercise  Eat too much     PREVENTION There is not one sure way to prevent polyps. You might be able to lower your risk of getting them if you:  Eat more fruits and vegetables and less fatty food.   Do not smoke.   Avoid alcohol.   Exercise every day.   Lose weight if you are  overweight.   Eating more calcium and folate can also lower your risk of getting polyps. Some foods that are rich in calcium are milk, cheese, and broccoli. Some foods that are rich in folate are chickpeas, kidney beans, and spinach.   Hemorrhoids Hemorrhoids are dilated (enlarged) veins around the rectum. Sometimes clots will form in the veins. This makes them swollen and painful. These are called thrombosed hemorrhoids.  Causes of hemorrhoids include:  Constipation.   Straining to have a bowel movement.   HEAVY LIFTING  HOME CARE INSTRUCTIONS  Eat a well balanced diet and drink 6 to 8 glasses of water every day to avoid constipation. You may also use a bulk laxative.   Avoid straining to have bowel movements.   Keep anal area dry and clean.   Do not use a donut shaped pillow or sit on the toilet for long periods. This increases blood pooling and pain.   Move your bowels when your body has the urge; this will require less straining and will decrease pain and pressure.

## 2018-11-19 NOTE — Progress Notes (Signed)
PT is aware.

## 2018-11-24 NOTE — Progress Notes (Signed)
CC'D TO PCP AND ON RECALL  °

## 2018-11-26 ENCOUNTER — Encounter (HOSPITAL_COMMUNITY): Payer: Self-pay | Admitting: Gastroenterology

## 2018-12-30 DIAGNOSIS — G4733 Obstructive sleep apnea (adult) (pediatric): Secondary | ICD-10-CM | POA: Diagnosis not present

## 2019-01-06 ENCOUNTER — Ambulatory Visit (INDEPENDENT_AMBULATORY_CARE_PROVIDER_SITE_OTHER): Payer: PPO | Admitting: Family Medicine

## 2019-01-06 ENCOUNTER — Other Ambulatory Visit: Payer: Self-pay

## 2019-01-06 DIAGNOSIS — Z13220 Encounter for screening for lipoid disorders: Secondary | ICD-10-CM | POA: Diagnosis not present

## 2019-01-06 DIAGNOSIS — R569 Unspecified convulsions: Secondary | ICD-10-CM | POA: Diagnosis not present

## 2019-01-06 DIAGNOSIS — Z79899 Other long term (current) drug therapy: Secondary | ICD-10-CM

## 2019-01-06 DIAGNOSIS — M545 Low back pain, unspecified: Secondary | ICD-10-CM

## 2019-01-06 DIAGNOSIS — K219 Gastro-esophageal reflux disease without esophagitis: Secondary | ICD-10-CM

## 2019-01-06 DIAGNOSIS — G8929 Other chronic pain: Secondary | ICD-10-CM | POA: Diagnosis not present

## 2019-01-06 DIAGNOSIS — Z5181 Encounter for therapeutic drug level monitoring: Secondary | ICD-10-CM

## 2019-01-06 MED ORDER — HYDROCODONE-ACETAMINOPHEN 10-325 MG PO TABS: ORAL_TABLET | ORAL | 0 refills | Status: DC

## 2019-01-06 MED ORDER — POTASSIUM CHLORIDE CRYS ER 10 MEQ PO TBCR: EXTENDED_RELEASE_TABLET | ORAL | 2 refills | Status: DC

## 2019-01-06 NOTE — Progress Notes (Signed)
Subjective:    Patient ID: Deborah Carroll, female    DOB: 1956/02/15, 63 y.o.   MRN: [id number]  HPI Pt here today for med check. Pt states she is doing fine on meds. Takes potassium only when she takes fluid med and Torsemide as needed for swelling.  Virtual Visit via Video Note  I connected with Deborah Carroll on 01/06/19 at 10:00 AM EDT by a video enabled telemedicine application and verified that I am speaking with the correct person using two identifiers. This patient does take her HCTZ on a regular basis but for some reason not taking her potassium so therefore I instructed her to take her potassium every day she states swelling in the legs are doing good blood pressure doing good She does have peripheral neuropathy issues.  She states duloxetine does help with this.  She would like to continue this.  She is not taking gabapentin She does have significant pain and discomfort and she is requesting ongoing of her pain medicine but only uses 2 a day This patient was seen today for chronic pain  The medication list was reviewed and updated.   -Compliance with medication: Overall good  - Number patient states they take daily: No more than 2 a day  -when was the last dose patient took?  Earlier today  The patient was advised the importance of maintaining medication and not using illegal substances with these.  Here for refills and follow up  The patient was educated that we can provide 3 monthly scripts for their medication, it is their responsibility to follow the instructions.  Side effects or complications from medications: Denies side effects  Patient is aware that pain medications are meant to minimize the severity of the pain to allow their pain levels to improve to allow for better function. They are aware of that pain medications cannot totally remove their pain.  Due for UDT ( at least once per year) : On her next office visit Patient states pain medicine does help her  function she denies abusing it.  The patient's BMI is calculated.  The patient does have obesity.  The patient does try to some degree staying active and watching diet.  It is in the vital signs and acknowledged.  It is above the recommended BMI for the patient's height and weight.  The patient has been counseled regarding healthy diet, restricted portions, avoiding excessive carbohydrates/sugary foods, and increase physical activity as health permits.  It is in the patient's best interest to lower the risk of secondary illness including heart disease strokes and cancer by losing weight.  The patient acknowledges this information. She is trying to lose weight try to watch her diet try to become more active  She does relate that reflux under good control with medicine she would like to continue the medication  Has history of seizures takes her medicine regular basis denies any seizure activity.    Location: Patient: home Provider: office   I discussed the limitations of evaluation and management by telemedicine and the availability of in person appointments. The patient expressed understanding and agreed to proceed.  History of Present Illness:    Observations/Objective:   Assessment and Plan:   Follow Up Instructions:    I discussed the assessment and treatment plan with the patient. The patient was provided an opportunity to ask questions and all were answered. The patient agreed with the plan and demonstrated an understanding of the instructions.   The patient was  advised to call back or seek an in-person evaluation if the symptoms worsen or if the condition fails to improve as anticipated.  I provided 15 minutes of non-face-to-face time during this encounter.   Vicente Males, LPN    Review of Systems  Constitutional: Negative for activity change, appetite change and fatigue.  HENT: Negative for congestion and rhinorrhea.   Respiratory: Negative for cough and shortness of  breath.   Cardiovascular: Negative for chest pain and leg swelling.  Gastrointestinal: Negative for abdominal pain and diarrhea.  Endocrine: Negative for polydipsia and polyphagia.  Musculoskeletal: Positive for arthralgias and back pain.  Skin: Negative for color change.  Neurological: Negative for dizziness and weakness.  Psychiatric/Behavioral: Negative for behavioral problems and confusion.       Objective:   Physical Exam   Today's visit was via telephone Physical exam was not possible for this visit      Assessment & Plan:  1. Chronic bilateral low back pain without sciatica Pain under good control.  Continue current medication no more than 60 tablets/month The patient was seen in followup for chronic pain. A review over at their current pain status was discussed. Drug registry was checked. Prescriptions were given. Discussion was held regarding the importance of compliance with medication as well as pain medication contract.  Time for questions regarding pain management plan occurred. Importance of regular followup visits was discussed. Patient was informed that medication may cause drowsiness and should not be combined  with other medications/alcohol or street drugs. Patient was cautioned that medication could cause drowsiness. If the patient feels medication is causing altered alertness then do not drive or operate dangerous equipment.   - Lipid Profile - Hepatic function panel - Basic Metabolic Panel (BMET) - Dilantin (Phenytoin) level, total  2. High risk medication use Because she is on seizure medicine check liver profile - Lipid Profile - Hepatic function panel - Basic Metabolic Panel (BMET) - Dilantin (Phenytoin) level, total  3. Lipid screening Hyperlipidemia check lipid profile watch diet - Lipid Profile - Hepatic function panel - Basic Metabolic Panel (BMET) - Dilantin (Phenytoin) level, total  4. Encounter for monitoring Dilantin therapy  Because of seizures check Dilantin level - Lipid Profile - Hepatic function panel - Basic Metabolic Panel (BMET) - Dilantin (Phenytoin) level, total  5. Morbid obesity ([place]) Morbid obesity try to restrict portions watch diet stay active try to lose weight  6. Seizures ([place]) Seizures under decent control none recently continue current medication  7. Gastroesophageal reflux disease without esophagitis Reflux under good control continue current medication  25 minutes was spent with the patient.  This statement verifies that 25 minutes was indeed spent with the patient.  More than 50% of this visit-total duration of the visit-was spent in counseling and coordination of care. The issues that the patient came in for today as reflected in the diagnosis (s) please refer to documentation for further details.

## 2019-01-19 ENCOUNTER — Other Ambulatory Visit: Payer: Self-pay | Admitting: Family Medicine

## 2019-01-20 DIAGNOSIS — H6123 Impacted cerumen, bilateral: Secondary | ICD-10-CM | POA: Diagnosis not present

## 2019-01-21 NOTE — Telephone Encounter (Signed)
Please clarify with patient her current dosing so we can make sure the prescription is correct thank you

## 2019-01-22 NOTE — Telephone Encounter (Signed)
Yes one yr

## 2019-01-22 NOTE — Telephone Encounter (Signed)
Pt takes 100mg  2 bid. Ok to change directions and send in refills?

## 2019-02-06 ENCOUNTER — Other Ambulatory Visit: Payer: Self-pay | Admitting: Family Medicine

## 2019-03-10 ENCOUNTER — Other Ambulatory Visit: Payer: Self-pay | Admitting: Family Medicine

## 2019-04-23 ENCOUNTER — Ambulatory Visit (INDEPENDENT_AMBULATORY_CARE_PROVIDER_SITE_OTHER): Payer: PPO | Admitting: Family Medicine

## 2019-04-23 ENCOUNTER — Other Ambulatory Visit: Payer: Self-pay

## 2019-04-23 DIAGNOSIS — Z79899 Other long term (current) drug therapy: Secondary | ICD-10-CM

## 2019-04-23 DIAGNOSIS — Z5181 Encounter for therapeutic drug level monitoring: Secondary | ICD-10-CM

## 2019-04-23 DIAGNOSIS — Z13220 Encounter for screening for lipoid disorders: Secondary | ICD-10-CM

## 2019-04-23 DIAGNOSIS — G8929 Other chronic pain: Secondary | ICD-10-CM | POA: Diagnosis not present

## 2019-04-23 DIAGNOSIS — R7303 Prediabetes: Secondary | ICD-10-CM | POA: Diagnosis not present

## 2019-04-23 DIAGNOSIS — R569 Unspecified convulsions: Secondary | ICD-10-CM | POA: Diagnosis not present

## 2019-04-23 DIAGNOSIS — M545 Low back pain, unspecified: Secondary | ICD-10-CM

## 2019-04-23 DIAGNOSIS — G609 Hereditary and idiopathic neuropathy, unspecified: Secondary | ICD-10-CM

## 2019-04-23 MED ORDER — HYDROCODONE-ACETAMINOPHEN 10-325 MG PO TABS: ORAL_TABLET | ORAL | 0 refills | Status: DC

## 2019-04-23 MED ORDER — PANTOPRAZOLE SODIUM 40 MG PO TBEC: DELAYED_RELEASE_TABLET | ORAL | 1 refills | Status: DC

## 2019-04-23 NOTE — Progress Notes (Signed)
Subjective:    Patient ID: Deborah Carroll, female    DOB: 1955-09-19, 64 y.o.   MRN: [id number]  HPI This patient was seen today for chronic pain  The medication list was reviewed and updated.   -Compliance with medication: Hydrocodone 10-325 mg  - Number patient states they take daily: 2-3  -when was the last dose patient took? Pt has been out of med for 4 days  The patient was advised the importance of maintaining medication and not using illegal substances with these.  Here for refills and follow up  The patient was educated that we can provide 3 monthly scripts for their medication, it is their responsibility to follow the instructions.  Side effects or complications from medications: none  Patient is aware that pain medications are meant to minimize the severity of the pain to allow their pain levels to improve to allow for better function. They are aware of that pain medications cannot totally remove their pain.  Due for UDT ( at least once per year) : Later this year today's visit was virtual  Chronic bilateral low back pain without sciatica  Idiopathic peripheral neuropathy  Prediabetes - Plan: Lipid Profile, COMPLETE METABOLIC PANEL WITH GFR, Dilantin (Phenytoin) level, total, CBC with Differential, Hemoglobin A1c  Seizures (HCC)  High risk medication use - Plan: Lipid Profile, COMPLETE METABOLIC PANEL WITH GFR, Dilantin (Phenytoin) level, total, CBC with Differential, Hemoglobin A1c  Lipid screening - Plan: Lipid Profile, COMPLETE METABOLIC PANEL WITH GFR, Dilantin (Phenytoin) level, total, CBC with Differential, Hemoglobin A1c  Encounter for monitoring Dilantin therapy - Plan: Lipid Profile, COMPLETE METABOLIC PANEL WITH GFR, Dilantin (Phenytoin) level, total, CBC with Differential, Hemoglobin A1c  Patient does have prediabetes she is trying to good job watching her diet and trying to stay a little more active eating at home more often She denies any seizure  issues states she is compliant with her medicines as long standing history of seizures Patient does have significant neuropathy she states pain medicine does help She also said pain medicine helps with her back gets her pain levels to a more manageable Patient does have morbid obesity she is trying to eat better she can trying to watch her diet and trying to lose weight although it is very difficult Virtual Visit via Telephone Note  I connected with Deborah Carroll on 04/23/19 at  8:30 AM EST by telephone and verified that I am speaking with the correct person using two identifiers.  Location: Patient: home Provider: office   I discussed the limitations, risks, security and privacy concerns of performing an evaluation and management service by telephone and the availability of in person appointments. I also discussed with the patient that there may be a patient responsible charge related to this service. The patient expressed understanding and agreed to proceed.   History of Present Illness:    Observations/Objective:   Assessment and Plan:   Follow Up Instructions:    I discussed the assessment and treatment plan with the patient. The patient was provided an opportunity to ask questions and all were answered. The patient agreed with the plan and demonstrated an understanding of the instructions.   The patient was advised to call back or seek an in-person evaluation if the symptoms worsen or if the condition fails to improve as anticipated.  I provided 30 minutes of non-face-to-face time during this encounter.            Review of Systems  Constitutional: Negative for  activity change, appetite change and fatigue.  HENT: Negative for congestion and rhinorrhea.   Respiratory: Negative for cough and shortness of breath.   Cardiovascular: Negative for chest pain and leg swelling.  Gastrointestinal: Negative for abdominal pain and diarrhea.  Endocrine: Negative for  polydipsia and polyphagia.  Skin: Negative for color change.  Neurological: Negative for dizziness and weakness.  Psychiatric/Behavioral: Negative for behavioral problems and confusion.       Objective:   Physical Exam  Today's visit was via telephone Physical exam was not possible for this visit       Assessment & Plan:  1. Chronic bilateral low back pain without sciatica She has ongoing back pain and discomfort has to take pain medication up to 3 times daily.  States it does take the edge off her pain allows her to be more functional denies any problems with  2. Idiopathic peripheral neuropathy Patient with neuropathy the pain medicine does help with her.  She does not feel that is getting worse  3. Prediabetes She does try to watch her diet she is trying to be more active her weight is still elevated still dealing with significant obesity check lab work - Lipid Profile - COMPLETE METABOLIC PANEL WITH GFR - Dilantin (Phenytoin) level, total - CBC with Differential - Hemoglobin A1c  4. Seizures ([place]) Has underlying seizure disorder but is under good control currently continue medication check lab work  5. High risk medication use Check lab work - Lipid Profile - COMPLETE METABOLIC PANEL WITH GFR - Dilantin (Phenytoin) level, total - CBC with Differential - Hemoglobin A1c  6. Lipid screening History of hyperlipidemia check lipid profile - Lipid Profile - COMPLETE METABOLIC PANEL WITH GFR - Dilantin (Phenytoin) level, total - CBC with Differential - Hemoglobin A1c  7. Encounter for monitoring Dilantin therapy Monitor Dilantin - Lipid Profile - COMPLETE METABOLIC PANEL WITH GFR - Dilantin (Phenytoin) level, total - CBC with Differential - Hemoglobin A1c   The patient was seen in followup for chronic pain. A review over at their current pain status was discussed. Drug registry was checked. Prescriptions were given. Discussion was held regarding the  importance of compliance with medication as well as pain medication contract.  Time for questions regarding pain management plan occurred. Importance of regular followup visits was discussed. Patient was informed that medication may cause drowsiness and should not be combined  with other medications/alcohol or street drugs. Patient was cautioned that medication could cause drowsiness. If the patient feels medication is causing altered alertness then do not drive or operate dangerous equipment.  Morbid obesity patient states she is trying to watch her diet trying to lose some weight  Pain medicine does make it more tolerable for her and able to function through the day without it she would have a difficult time

## 2019-04-28 ENCOUNTER — Other Ambulatory Visit: Payer: PPO

## 2019-05-14 DIAGNOSIS — G4733 Obstructive sleep apnea (adult) (pediatric): Secondary | ICD-10-CM | POA: Diagnosis not present

## 2019-06-09 ENCOUNTER — Telehealth: Payer: Self-pay | Admitting: Family Medicine

## 2019-06-09 NOTE — Telephone Encounter (Signed)
Patient advised she should quarantine for 10 days  avoid grand kids, and get tested tomorrow or Friday. Patient verbalized understanding.

## 2019-06-09 NOTE — Telephone Encounter (Signed)
Should qarantine ten d, avoid g kids, get tested tom or fri

## 2019-06-09 NOTE — Telephone Encounter (Signed)
Pt and husband had lunch with a couple over weekend. They have now tested positive for COVID. She would like to know what they need to do. Currently not having symptoms. 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

## 2019-08-07 ENCOUNTER — Other Ambulatory Visit: Payer: Self-pay | Admitting: Family Medicine

## 2019-08-18 ENCOUNTER — Telehealth: Payer: Self-pay | Admitting: Family Medicine

## 2019-08-18 MED ORDER — HYDROCODONE-ACETAMINOPHEN 10-325 MG PO TABS: ORAL_TABLET | ORAL | 0 refills | Status: DC

## 2019-08-18 NOTE — Telephone Encounter (Signed)
patient is requesting refill on pain medication hydrocodone completely out has medication follow up visit on 6/4. [facility]

## 2019-09-10 ENCOUNTER — Telehealth: Payer: Self-pay | Admitting: Family Medicine

## 2019-09-10 ENCOUNTER — Telehealth (INDEPENDENT_AMBULATORY_CARE_PROVIDER_SITE_OTHER): Payer: PPO | Admitting: Family Medicine

## 2019-09-10 ENCOUNTER — Other Ambulatory Visit: Payer: Self-pay

## 2019-09-10 DIAGNOSIS — R634 Abnormal weight loss: Secondary | ICD-10-CM

## 2019-09-10 DIAGNOSIS — G8929 Other chronic pain: Secondary | ICD-10-CM | POA: Diagnosis not present

## 2019-09-10 DIAGNOSIS — Z13220 Encounter for screening for lipoid disorders: Secondary | ICD-10-CM

## 2019-09-10 DIAGNOSIS — M545 Low back pain, unspecified: Secondary | ICD-10-CM

## 2019-09-10 DIAGNOSIS — R109 Unspecified abdominal pain: Secondary | ICD-10-CM | POA: Diagnosis not present

## 2019-09-10 DIAGNOSIS — G609 Hereditary and idiopathic neuropathy, unspecified: Secondary | ICD-10-CM | POA: Diagnosis not present

## 2019-09-10 DIAGNOSIS — Z79899 Other long term (current) drug therapy: Secondary | ICD-10-CM | POA: Diagnosis not present

## 2019-09-10 DIAGNOSIS — R7303 Prediabetes: Secondary | ICD-10-CM | POA: Diagnosis not present

## 2019-09-10 MED ORDER — TORSEMIDE 20 MG PO TABS: 20.0000 mg | ORAL_TABLET | Freq: Every day | ORAL | 2 refills | Status: DC | PRN

## 2019-09-10 MED ORDER — HYDROCODONE-ACETAMINOPHEN 10-325 MG PO TABS: ORAL_TABLET | ORAL | 0 refills | Status: DC

## 2019-09-10 MED ORDER — DULOXETINE HCL 60 MG PO CPEP: 60.0000 mg | ORAL_CAPSULE | Freq: Every day | ORAL | 1 refills | Status: DC

## 2019-09-10 MED ORDER — PANTOPRAZOLE SODIUM 40 MG PO TBEC: DELAYED_RELEASE_TABLET | ORAL | 1 refills | Status: DC

## 2019-09-10 MED ORDER — PHENYTOIN SODIUM EXTENDED 100 MG PO CAPS: ORAL_CAPSULE | ORAL | 1 refills | Status: DC

## 2019-09-10 MED ORDER — HYDROCHLOROTHIAZIDE 25 MG PO TABS: ORAL_TABLET | ORAL | 1 refills | Status: DC

## 2019-09-10 MED ORDER — POTASSIUM CHLORIDE CRYS ER 10 MEQ PO TBCR: EXTENDED_RELEASE_TABLET | ORAL | 1 refills | Status: DC

## 2019-09-10 NOTE — Telephone Encounter (Signed)
PT per Dr Nicki Reaper needed two week visit for abdominal pain and weight loss. Pt wanting to know if labs will need to be done prior to the appt on 6/18

## 2019-09-10 NOTE — Progress Notes (Signed)
Subjective:    Patient ID: Deborah Carroll, female    DOB: 1955-09-15, 64 y.o.   MRN: [id number]  HPI This patient was seen today for chronic pain  The medication list was reviewed and updated.   -Compliance with medication: takes  2 -3 a day  - Number patient states they take daily: 2 - 3  A day  -when was the last dose patient took?  today  The patient was advised the importance of maintaining medication and not using illegal substances with these.  Here for refills and follow up  The patient was educated that we can provide 3 monthly scripts for their medication, it is their responsibility to follow the instructions.  Side effects or complications from medications: none  Patient is aware that pain medications are meant to minimize the severity of the pain to allow their pain levels to improve to allow for better function. They are aware of that pain medications cannot totally remove their pain.  Due for UDT ( at least once per year) : due today but doing virtual visit so unable to get urine sample  Has lost 24lbs in the past 2 months without trying to lose. States she not able to eat much.   phq9 done.    Telemedicine from 09/10/2019 in [place]  PHQ-9 Total Score  11     Depression screen [facility] 2/9 09/10/2019 03/20/2018 05/01/2016 08/30/2015  Decreased Interest 0 1 0 2  Down, Depressed, Hopeless 1 1 0 2  PHQ - 2 Score 1 2 0 4  Altered sleeping 3 0 - 1  Tired, decreased energy 3 0 - 2  Change in appetite 3 0 - 2  Feeling bad or failure about yourself  1 0 - 2  Trouble concentrating 0 0 - 2  Moving slowly or fidgety/restless 0 0 - 0  Suicidal thoughts 0 0 - 0  PHQ-9 Score 11 2 - 13  Difficult doing work/chores Not difficult at all Not difficult at all - Somewhat difficult      Virtual Visit via Telephone Note  I connected with Deborah Carroll on 09/10/19 at  1:40 PM EDT by telephone and verified that I am speaking with the correct person using two  identifiers.  Location: Patient: home Provider: office   I discussed the limitations, risks, security and privacy concerns of performing an evaluation and management service by telephone and the availability of in person appointments. I also discussed with the patient that there may be a patient responsible charge related to this service. The patient expressed understanding and agreed to proceed.   History of Present Illness:    Observations/Objective:   Assessment and Plan:   Follow Up Instructions:    I discussed the assessment and treatment plan with the patient. The patient was provided an opportunity to ask questions and all were answered. The patient agreed with the plan and demonstrated an understanding of the instructions.   The patient was advised to call back or seek an in-person evaluation if the symptoms worsen or if the condition fails to improve as anticipated.  I provided 20 minutes of non-face-to-face time during this encounter.      Review of Systems     Objective:   Physical Exam        Assessment & Plan:  1. Weight loss The patient is concerned about her weight loss for no good reason we will do some lab work and have the patient follow-up with  [place] in the near future plus also follow-up with gastroenterology for possible EGD  2. Chronic bilateral low back pain without sciatica Chronic low back pain hydrocodone denies abusing it may continue it currently  3. Idiopathic peripheral neuropathy Neuropathy causes pain and discomfort hydrocodone helps she uses it responsibly denies abusing it The patient was seen in followup for chronic pain. A review over at their current pain status was discussed. Drug registry was checked. Prescriptions were given.  Regular follow-up recommended. Discussion was held regarding the importance of compliance with medication as well as pain medication contract.  Patient was informed that medication may cause drowsiness  and should not be combined  with other medications/alcohol or street drugs. If the patient feels medication is causing altered alertness then do not drive or operate dangerous equipment.  Drug registry was checked  4. Morbid obesity ([place]) Morbid obesity patient was counseled to try to lose weight watch diet - Basic metabolic panel - CBC with Differential/Platelet  5. Abdominal pain, unspecified abdominal location Intermittent abdominal pain along with weight loss referral to gastroenterology for further evaluation - Basic metabolic panel - CBC with Differential/Platelet - Ambulatory referral to Gastroenterology  6. Prediabetes Prediabetes check A1c - Hemoglobin A1c  7. High risk medication use Check liver function because medications - Hepatic function panel  8. Lipid screening Cholesterol profile - Lipid panel

## 2019-09-13 NOTE — Telephone Encounter (Signed)
Labs ordered on 09/10/19. Informed patient that labs were ordered at 09/10/19 visit. Pt verbalized understanding

## 2019-09-13 NOTE — Telephone Encounter (Signed)
Re-routing

## 2019-09-23 ENCOUNTER — Encounter: Payer: Self-pay | Admitting: Family Medicine

## 2019-09-24 ENCOUNTER — Ambulatory Visit: Payer: PPO | Admitting: Family Medicine

## 2019-09-27 ENCOUNTER — Encounter: Payer: Self-pay | Admitting: Internal Medicine

## 2019-09-27 DIAGNOSIS — Z13220 Encounter for screening for lipoid disorders: Secondary | ICD-10-CM | POA: Diagnosis not present

## 2019-09-27 DIAGNOSIS — R7303 Prediabetes: Secondary | ICD-10-CM | POA: Diagnosis not present

## 2019-09-27 DIAGNOSIS — R109 Unspecified abdominal pain: Secondary | ICD-10-CM | POA: Diagnosis not present

## 2019-09-27 DIAGNOSIS — Z79899 Other long term (current) drug therapy: Secondary | ICD-10-CM | POA: Diagnosis not present

## 2019-09-28 LAB — CBC WITH DIFFERENTIAL/PLATELET
Basophils Absolute: 0.1 10*3/uL (ref 0.0–0.2)
Basos: 1 %
EOS (ABSOLUTE): 0.2 10*3/uL (ref 0.0–0.4)
Eos: 2 %
Hematocrit: 41 % (ref 34.0–46.6)
Hemoglobin: 14.2 g/dL (ref 11.1–15.9)
Immature Grans (Abs): 0.1 10*3/uL (ref 0.0–0.1)
Immature Granulocytes: 1 %
Lymphocytes Absolute: 2.7 10*3/uL (ref 0.7–3.1)
Lymphs: 27 %
MCH: 29.9 pg (ref 26.6–33.0)
MCHC: 34.6 g/dL (ref 31.5–35.7)
MCV: 86 fL (ref 79–97)
Monocytes Absolute: 0.7 10*3/uL (ref 0.1–0.9)
Monocytes: 6 %
Neutrophils Absolute: 6.5 10*3/uL (ref 1.4–7.0)
Neutrophils: 63 %
Platelets: 170 10*3/uL (ref 150–450)
RBC: 4.75 x10E6/uL (ref 3.77–5.28)
RDW: 12.2 % (ref 11.7–15.4)
WBC: 10.2 10*3/uL (ref 3.4–10.8)

## 2019-09-28 LAB — HEPATIC FUNCTION PANEL
ALT: 20 IU/L (ref 0–32)
AST: 22 IU/L (ref 0–40)
Albumin: 4.2 g/dL (ref 3.8–4.8)
Alkaline Phosphatase: 140 IU/L — ABNORMAL HIGH (ref 48–121)
Bilirubin Total: 0.4 mg/dL (ref 0.0–1.2)
Bilirubin, Direct: 0.16 mg/dL (ref 0.00–0.40)
Total Protein: 6.6 g/dL (ref 6.0–8.5)

## 2019-09-28 LAB — HEMOGLOBIN A1C
Est. average glucose Bld gHb Est-mCnc: 143 mg/dL
Hgb A1c MFr Bld: 6.6 % — ABNORMAL HIGH (ref 4.8–5.6)

## 2019-09-28 LAB — BASIC METABOLIC PANEL
BUN/Creatinine Ratio: 18 (ref 12–28)
BUN: 14 mg/dL (ref 8–27)
CO2: 29 mmol/L (ref 20–29)
Calcium: 9.6 mg/dL (ref 8.7–10.3)
Chloride: 98 mmol/L (ref 96–106)
Creatinine, Ser: 0.76 mg/dL (ref 0.57–1.00)
GFR calc Af Amer: 97 mL/min/{1.73_m2} (ref >59)
GFR calc non Af Amer: 84 mL/min/{1.73_m2} (ref >59)
Glucose: 176 mg/dL — ABNORMAL HIGH (ref 65–99)
Potassium: 3.7 mmol/L (ref 3.5–5.2)
Sodium: 143 mmol/L (ref 134–144)

## 2019-09-28 LAB — LIPID PANEL
Chol/HDL Ratio: 4.1 ratio (ref 0.0–4.4)
Cholesterol, Total: 188 mg/dL (ref 100–199)
HDL: 46 mg/dL (ref >39)
LDL Chol Calc (NIH): 117 mg/dL — ABNORMAL HIGH (ref 0–99)
Triglycerides: 142 mg/dL (ref 0–149)
VLDL Cholesterol Cal: 25 mg/dL (ref 5–40)

## 2019-09-29 ENCOUNTER — Ambulatory Visit (INDEPENDENT_AMBULATORY_CARE_PROVIDER_SITE_OTHER): Payer: PPO | Admitting: Family Medicine

## 2019-09-29 ENCOUNTER — Other Ambulatory Visit: Payer: Self-pay

## 2019-09-29 ENCOUNTER — Encounter: Payer: Self-pay | Admitting: Family Medicine

## 2019-09-29 VITALS — BP 136/76 | Temp 96.8°F | Wt 226.2 lb

## 2019-09-29 DIAGNOSIS — E1169 Type 2 diabetes mellitus with other specified complication: Secondary | ICD-10-CM

## 2019-09-29 DIAGNOSIS — R109 Unspecified abdominal pain: Secondary | ICD-10-CM | POA: Diagnosis not present

## 2019-09-29 DIAGNOSIS — E785 Hyperlipidemia, unspecified: Secondary | ICD-10-CM | POA: Diagnosis not present

## 2019-09-29 DIAGNOSIS — R634 Abnormal weight loss: Secondary | ICD-10-CM | POA: Diagnosis not present

## 2019-09-29 DIAGNOSIS — Z1329 Encounter for screening for other suspected endocrine disorder: Secondary | ICD-10-CM

## 2019-09-29 DIAGNOSIS — R7303 Prediabetes: Secondary | ICD-10-CM

## 2019-09-29 MED ORDER — DULOXETINE HCL 30 MG PO CPEP
30.0000 mg | ORAL_CAPSULE | Freq: Every day | ORAL | 0 refills | Status: DC
Start: 2019-09-29 — End: 2019-11-05

## 2019-09-29 MED ORDER — SERTRALINE HCL 100 MG PO TABS
100.0000 mg | ORAL_TABLET | Freq: Every day | ORAL | 0 refills | Status: DC
Start: 2019-09-29 — End: 2019-12-27

## 2019-09-29 MED ORDER — ROSUVASTATIN CALCIUM 5 MG PO TABS
5.0000 mg | ORAL_TABLET | Freq: Every day | ORAL | 1 refills | Status: DC
Start: 2019-09-29 — End: 2020-01-03

## 2019-09-29 NOTE — Patient Instructions (Signed)

## 2019-09-29 NOTE — Progress Notes (Signed)
   Subjective:    Patient ID: Deborah Carroll, female    DOB: September 09, 1955, 64 y.o.   MRN: [id number]  Abdominal Pain This is a new problem. Episode onset: August 2020; after colonoscopy  Pain location: lower abdominal area. Quality: starts off easy then gets worse then will ease up again. Pertinent negatives include no headaches. Associated symptoms comments: When pt had pains, she would need to go to the bathroom about one hour later, BM would be solid at that time but afterwards will have diarrhea. After that pt states she would not go again for a couple of days. The pain is relieved by bowel movements. She has tried nothing for the symptoms.   Weight loss  Morbid obesity (HCC)  Abdominal pain, unspecified abdominal location - Plan: CT Abdomen Pelvis W Contrast  Hyperlipidemia associated with type 2 diabetes mellitus ([place])  Screening for thyroid disorder - Plan: TSH, T4, free, T3, free  Concerned about her weight going down.  This appears to be happening without any effort on her part although she does state early satiety which could be tied to this there is a possibility for gastroparesis because she does have some nausea in the evenings but she is also having lower abdominal pain on a frequent basis over the course of the past several months which could indicate the possibility of underlying disease including even the possibility of cancer   Review of Systems  Constitutional: Negative for activity change, appetite change and fatigue.  HENT: Negative for congestion.   Respiratory: Negative for cough.   Cardiovascular: Negative for chest pain.  Gastrointestinal: Positive for abdominal pain.  Skin: Negative for color change.  Neurological: Negative for headaches.  Psychiatric/Behavioral: Negative for behavioral problems.       Objective:   Physical Exam Lungs clear heart regular HEENT benign abdomen soft with some lower abdominal tenderness no masses felt extremities no edema         Assessment & Plan:  1. Weight loss This is concerning needs further looking into.  We will be checking thyroid function await the results of this as well Could also be tied into her abdominal discomfort.  2. Morbid obesity ([place]) Certainly morbid obesity increases her risk of other health issues continue to watch diet try to lose weight  3. Abdominal pain, unspecified abdominal location With intermittent abdominal pain mainly in the lower abdomen over the course of the past several months along with a 20+ pound weight loss it is necessary to do a CT scan to rule out the possibility of cancer - CT Abdomen Pelvis W Contrast  4. Hyperlipidemia associated with type 2 diabetes mellitus ([place]) Cholesterol higher than what we would like to see at start rosuvastatin 5 mg daily I did talk with patient about starting medication for diabetes she would prefer to try dietary measures first recheck A1c in the fall  5. Screening for thyroid disorder Check thyroid because of weight loss - TSH - T4, free - T3, free  Also with early satiety may need referral to GI for EGD Less likely is gastroparesis but possible  Finally patient was encouraged to get Covid vaccine

## 2019-10-26 ENCOUNTER — Telehealth: Payer: Self-pay | Admitting: Family Medicine

## 2019-10-26 ENCOUNTER — Ambulatory Visit (HOSPITAL_COMMUNITY): Payer: PPO

## 2019-10-26 NOTE — Telephone Encounter (Signed)
Script wrote out and ready to sign ( on your door)

## 2019-10-26 NOTE — Telephone Encounter (Signed)
Prescription was signed thank you °

## 2019-10-26 NOTE — Telephone Encounter (Signed)
Patient is requesting a new prescription for C Pap supplies and new mask called into [place].

## 2019-10-27 NOTE — Telephone Encounter (Signed)
Script was rewritten and put back on your door to sign.

## 2019-10-27 NOTE — Telephone Encounter (Signed)
Done

## 2019-10-27 NOTE — Telephone Encounter (Signed)
Faxed to [place] [facility]. Pt was notified yesterday we would send it.

## 2019-10-28 DIAGNOSIS — G4733 Obstructive sleep apnea (adult) (pediatric): Secondary | ICD-10-CM | POA: Diagnosis not present

## 2019-11-04 ENCOUNTER — Other Ambulatory Visit: Payer: Self-pay | Admitting: Family Medicine

## 2019-11-05 ENCOUNTER — Other Ambulatory Visit: Payer: Self-pay

## 2019-11-05 ENCOUNTER — Ambulatory Visit (HOSPITAL_COMMUNITY)
Admission: RE | Admit: 2019-11-05 | Discharge: 2019-11-05 | Disposition: A | Discharge status: Home / Self Care | Payer: PPO | Source: Ambulatory Visit | Attending: Family Medicine | Admitting: Family Medicine

## 2019-11-05 DIAGNOSIS — K5730 Diverticulosis of large intestine without perforation or abscess without bleeding: Secondary | ICD-10-CM | POA: Diagnosis not present

## 2019-11-05 DIAGNOSIS — R109 Unspecified abdominal pain: Secondary | ICD-10-CM | POA: Insufficient documentation

## 2019-11-05 LAB — POCT I-STAT CREATININE: Creatinine, Ser: 0.7 mg/dL (ref 0.44–1.00)

## 2019-11-05 MED ORDER — IOHEXOL 300 MG/ML  SOLN
100.0000 mL | Freq: Once | INTRAMUSCULAR | Status: AC | PRN
  Administered 2019-11-05: 100 mL via INTRAVENOUS

## 2019-11-10 NOTE — Addendum Note (Signed)
Addended by: Dairl Ponder on: 11/10/2019 09:28 AM   Modules accepted: Orders

## 2019-11-17 ENCOUNTER — Ambulatory Visit: Payer: PPO | Admitting: Gastroenterology

## 2019-11-25 DIAGNOSIS — H02833 Dermatochalasis of right eye, unspecified eyelid: Secondary | ICD-10-CM | POA: Diagnosis not present

## 2019-11-25 DIAGNOSIS — H02836 Dermatochalasis of left eye, unspecified eyelid: Secondary | ICD-10-CM | POA: Diagnosis not present

## 2019-12-26 ENCOUNTER — Other Ambulatory Visit: Payer: Self-pay | Admitting: Family Medicine

## 2019-12-27 NOTE — Telephone Encounter (Signed)
Patient schedule appointment for 10/26

## 2019-12-28 ENCOUNTER — Other Ambulatory Visit (HOSPITAL_COMMUNITY): Payer: Self-pay | Admitting: Family Medicine

## 2019-12-28 ENCOUNTER — Other Ambulatory Visit: Payer: Self-pay | Admitting: *Deleted

## 2019-12-28 ENCOUNTER — Telehealth: Payer: Self-pay | Admitting: Family Medicine

## 2019-12-28 DIAGNOSIS — G609 Hereditary and idiopathic neuropathy, unspecified: Secondary | ICD-10-CM

## 2019-12-28 DIAGNOSIS — Z1231 Encounter for screening mammogram for malignant neoplasm of breast: Secondary | ICD-10-CM

## 2019-12-28 DIAGNOSIS — R569 Unspecified convulsions: Secondary | ICD-10-CM

## 2019-12-28 NOTE — Telephone Encounter (Signed)
Nurses Please let the patient know : as is often the case in life many people can acquire multiple illnesses as they get older in which there may not be a perfect answer. So in her case Dilantin is therefore a reason But also having neuropathy issues Also having concerns that statins can make this worse Yet also being at increased risk of heart attacks and strokes because of diabetes and cholesterol In this particular situation there is no harm in delaying the statins until we get the opinion of a neurologist regarding her neuropathy I would recommend consultation with neurology [facility] or [facility] for the neuropathy and to specifically address the use of statins as well as use of Dilantin For now we will hold off on the statins until we get this consultation Continue Dilantin for now

## 2019-12-28 NOTE — Telephone Encounter (Signed)
Patient states cant take rosuvastatin 5 mg having side effects from it. Can you call in something else.

## 2019-12-28 NOTE — Telephone Encounter (Signed)
Patient notified of mammo app 10/12 @ 3:00

## 2019-12-28 NOTE — Telephone Encounter (Signed)
If patient is having left breast pain we would recommend a diagnostic mammogram and ultrasound, it would be wise to review this with the patient shows she would be aware we would also recommend breast exam either with myself or with Pecolia Ades or Kandis Fantasia.

## 2019-12-28 NOTE — Telephone Encounter (Signed)
Nurses It would be helpful to know what side effects Depending on the side effects.  There are other statin medications that could help with cholesterol and significantly lower the risk of heart attacks and strokes. There are also studies that low-dose statins taken 2 days a week are often tolerated in individuals who do not tolerate other dosage So therefore it is necessary to find out side effects And also find out patient's willingness to try a different statin to lower her risk of heart attacks and strokes Thank you

## 2019-12-28 NOTE — Telephone Encounter (Signed)
Ok to schedule diagnostic mammo and schedule appointment for when Deborah Carroll returns?

## 2019-12-28 NOTE — Telephone Encounter (Signed)
Patient advised of Dr. Bary Leriche recommendation. Referral in epic to [facility].  Patient also wanted to let you know she was contacted about scheduling mammo but let them know she has had a slight pain in her left breast for several weeks. Ok to order diagnostic mammo?

## 2019-12-28 NOTE — Telephone Encounter (Signed)
Patient reports the neuropathy pain in her feet has gotten worse as well as aches/pain up her calf and thigh. She read not to take statins if you have neuropathy but she is willing to try whatever Dr. Nicki Reaper recommends.  Also wanted to let you know while she was reading about the medications she also saw that it is not recommended to take dilantin with neuropathy and she has been taking this for 30 years.

## 2019-12-28 NOTE — Progress Notes (Signed)
US breast

## 2019-12-28 NOTE — Telephone Encounter (Signed)
May schedule the diagnostic mammo and ultrasound due to breast pain May schedule office visit with Santiago Glad that would occur after these tests as well as when Santiago Glad returns

## 2020-01-01 ENCOUNTER — Other Ambulatory Visit: Payer: Self-pay | Admitting: Family Medicine

## 2020-01-03 ENCOUNTER — Ambulatory Visit: Payer: PPO | Admitting: Gastroenterology

## 2020-01-03 ENCOUNTER — Encounter: Payer: Self-pay | Admitting: Gastroenterology

## 2020-01-03 ENCOUNTER — Other Ambulatory Visit: Payer: Self-pay

## 2020-01-03 VITALS — BP 144/83 | HR 88 | Temp 97.6°F | Ht 62.0 in | Wt 224.4 lb

## 2020-01-03 DIAGNOSIS — K219 Gastro-esophageal reflux disease without esophagitis: Secondary | ICD-10-CM

## 2020-01-03 DIAGNOSIS — R634 Abnormal weight loss: Secondary | ICD-10-CM

## 2020-01-03 DIAGNOSIS — K582 Mixed irritable bowel syndrome: Secondary | ICD-10-CM | POA: Diagnosis not present

## 2020-01-03 DIAGNOSIS — R630 Anorexia: Secondary | ICD-10-CM

## 2020-01-03 MED ORDER — DICYCLOMINE HCL 10 MG PO CAPS: ORAL_CAPSULE | ORAL | 2 refills | Status: DC

## 2020-01-03 NOTE — Patient Instructions (Addendum)
1. Start Bentyl to decrease abdominal cramping and loose stools.  Try taking 1 every morning.  May take up to 3 times daily on days you are having more abdominal cramping and loose stools.  Hold for a day or so if you develop constipation. 2. Please go to the lab and have our blood work drawn at the same time of Dr. Lance Sell labs.  Make sure that you tell them that you have the labs for Dr. Wolfgang Phoenix that includes basic metabolic panel, lipid panel, A1c, thyroid labs. 3. Return to the office in 8 weeks or call sooner if needed.  If your appetite does not improve, you continue to have nausea with recommending an upper endoscopy.

## 2020-01-03 NOTE — Progress Notes (Signed)
Primary Care Physician: Kathyrn Drown, MD  Primary Gastroenterologist:  Elon Alas. Abbey Chatters, DO   Chief Complaint  Patient presents with  . Abdominal Pain    no longer having pain. Reports not having any issues like before    HPI: Deborah Carroll is a 64 y.o. female here at the request of Dr. Wolfgang Phoenix for further evaluation of abdominal pain.  Patient has had some weight loss, early satiety.  According to PCP note, patient had lost about 20 pounds unintentionally.  She has been having frequent abdominal pain, early satiety, nausea.  CT abdomen pelvis with contrast July 2021: Moderate colonic stool burden noted.  Fluid distended stomach but otherwise unremarkable.  Patient states over the last couple of months, symptoms have improved somewhat.  She has lost only 2 more pounds since June but continues to struggle with her appetite.  She finds that she is not able to eat as much at 1 time as she used to.  Does not feel like she is eating normal sized meal before she gets full.  Not as bad as it was back in June.  No vomiting.  Continues to have some nausea after meals.  Heartburn is controlled on pantoprazole.  She has been on this for some time.  Bowel movements continue to be an issue.  She notes after colonoscopy last year she started having some crampiness in her lower abdomen, quite sharp at times.  Generally within an hour she would have several bowel movements.  Pain has become less frequent but still happening about once per week.  Most days she has [place] 4 stool but after couple stools her stools become looser.  She will generally have a couple of days in a row with several stools and then may have a day where her stools are normal.  Last week she did have [place] 1 followed by multiple loose stools.  She does not take anything to slow her stools down.  She did try Metamucil but this seemed to make her stools looser.  No melena or rectal bleeding.  Wt Readings from Last 3 Encounters:    01/03/20 224 lb 6.4 oz (101.8 kg)  09/29/19 226 lb 3.2 oz (102.6 kg)  11/13/18 239 lb (108.4 kg)     Current Outpatient Medications  Medication Sig Dispense Refill  . hydrochlorothiazide (HYDRODIURIL) 25 MG tablet TAKE 1 TABLET(25 MG) BY MOUTH DAILY 90 tablet 1  . HYDROcodone-acetaminophen (NORCO) 10-325 MG tablet Take one tablet po TID prn pain 90 tablet 0  . HYDROcodone-acetaminophen (NORCO) 10-325 MG tablet Take one tablet po TID prn pain 90 tablet 0  . HYDROcodone-acetaminophen (NORCO) 10-325 MG tablet Take one tablet po TID prn pain 90 tablet 0  . pantoprazole (PROTONIX) 40 MG tablet TAKE 1 TABLET(40 MG) BY MOUTH DAILY 90 tablet 1  . phenytoin (DILANTIN) 100 MG ER capsule TAKE 2 CAPSULES BY MOUTH EVERY MORNING AND 2 CAPSULE BY MOUTH EVERY EVENING 360 capsule 1  . potassium chloride (KLOR-CON) 10 MEQ tablet Take one tablet po BID (Patient taking differently: Take one tablet po BID when takes torsemide) 180 tablet 1  . rosuvastatin (CRESTOR) 5 MG tablet Take 1 tablet (5 mg total) by mouth daily. 90 tablet 1  . sertraline (ZOLOFT) 100 MG tablet TAKE 1 TABLET(100 MG) BY MOUTH DAILY 90 tablet 0  . torsemide (DEMADEX) 20 MG tablet Take 1 tablet (20 mg total) by mouth daily as needed (fluid or swelling). 30 tablet 2  No current facility-administered medications for this visit.    Allergies as of 01/03/2020  . (No Known Allergies)    ROS:  General: Negative for  fever, chills, fatigue, weakness.  See HPI ENT: Negative for hoarseness, difficulty swallowing , nasal congestion. CV: Negative for chest pain, angina, palpitations, dyspnea on exertion, peripheral edema.  Respiratory: Negative for dyspnea at rest, dyspnea on exertion, cough, sputum, wheezing.  GI: See history of present illness. GU:  Negative for dysuria, hematuria, urinary incontinence, urinary frequency, nocturnal urination.  Endo: See HPI   Physical Examination:   BP (!) 144/83   Pulse 88   Temp 97.6 F (36.4 C)  (Oral)   Ht 5\' 2"  (1.575 m)   Wt 224 lb 6.4 oz (101.8 kg)   BMI 41.04 kg/m   General: Well-nourished, well-developed in no acute distress.  Eyes: No icterus. Mouth: masked Lungs: Clear to auscultation bilaterally.  Heart: Regular rate and rhythm, no murmurs rubs or gallops.  Abdomen: Bowel sounds are normal, nontender, nondistended, no hepatosplenomegaly or masses, no abdominal bruits or hernia , no rebound or guarding.   Extremities: No lower extremity edema. No clubbing or deformities. Neuro: Alert and oriented x 4   Skin: Warm and dry, no jaundice.   Psych: Alert and cooperative, normal mood and affect.  Labs:  Lab Results  Component Value Date   CREATININE 0.70 11/05/2019   BUN 14 09/27/2019   NA 143 09/27/2019   K 3.7 09/27/2019   CL 98 09/27/2019   CO2 29 09/27/2019   Lab Results  Component Value Date   WBC 10.2 09/27/2019   HGB 14.2 09/27/2019   HCT 41.0 09/27/2019   MCV 86 09/27/2019   PLT 170 09/27/2019   Lab Results  Component Value Date   ALT 20 09/27/2019   AST 22 09/27/2019   ALKPHOS 140 (H) 09/27/2019   BILITOT 0.4 09/27/2019    No results found for: TSH  Lab Results  Component Value Date   HGBA1C 6.6 (H) 09/27/2019     Imaging Studies: No results found.    Impression/plan: 64 year old female presenting for further evaluation of lower abdominal pain, weight loss, early satiety.  Weight loss of approximately 20 pounds although weight loss has slowed over the past couple of months.  Early satiety has improved somewhat but continues to struggle with appetite.  Some nausea after meals but no vomiting.  Having lower abdominal cramping with multiple stools ranging from [place] 1 through 6.  CT showing moderate colonic stool burden otherwise no acute findings.  Has had similar bowel concerns off and on over the years.  May have an element of IBS.  Upper GI symptoms could be secondary to gastroparesis in the setting of diabetes and pain medications,  gastritis, PUD.  Patient wants to hold off on upper endoscopy at this time.  May consider in the future if her symptoms have not resolved.  She feels like she is doing better than back in June.  She notes that she woke up at time of her last endoscopy therefore would recommend propofol for sedation.  For her bowel issues, trial of Bentyl to decrease abdominal cramping and loose stools.  Advised her to take 1 every morning but on bad days can take up to 3.  Hold for constipation.  We will screen for celiac when she goes for labs this week for her PCP.  Also advised her to have lab complete her thyroid labs which were previously ordered and not done.  Return to the office in 8 weeks.

## 2020-01-04 LAB — IGA: IgA/Immunoglobulin A, Serum: 378 mg/dL — ABNORMAL HIGH (ref 87–352)

## 2020-01-04 LAB — TISSUE TRANSGLUTAMINASE, IGA: Transglutaminase IgA: 3 U/mL (ref 0–3)

## 2020-01-04 NOTE — Progress Notes (Signed)
Cc'ed to pcp °

## 2020-01-05 ENCOUNTER — Encounter: Payer: Self-pay | Admitting: Family Medicine

## 2020-01-06 ENCOUNTER — Telehealth: Payer: Self-pay

## 2020-01-06 NOTE — Telephone Encounter (Signed)
Patient stated her only sleep study was 15 or more years ag0. Scheduled patient an appt with Dr Nicki Reaper to discuss/schedule  new sleep study. Patient has appt 01/11/2020 with Dr Nicki Reaper to discuss.

## 2020-01-06 NOTE — Telephone Encounter (Signed)
Pt is wanting to get her cpap supplies through a different company. She is wanting to go through [employer].   They are needing an order, a sleep study, and a demographics.   Phone number for [facility] [phone] Fax number for [facility] [phone]

## 2020-01-11 ENCOUNTER — Encounter: Payer: Self-pay | Admitting: Family Medicine

## 2020-01-11 ENCOUNTER — Other Ambulatory Visit: Payer: Self-pay

## 2020-01-11 ENCOUNTER — Ambulatory Visit (INDEPENDENT_AMBULATORY_CARE_PROVIDER_SITE_OTHER): Payer: PPO | Admitting: Family Medicine

## 2020-01-11 VITALS — BP 128/82 | Temp 97.9°F | Ht 62.0 in | Wt 227.0 lb

## 2020-01-11 DIAGNOSIS — G4733 Obstructive sleep apnea (adult) (pediatric): Secondary | ICD-10-CM

## 2020-01-11 DIAGNOSIS — E785 Hyperlipidemia, unspecified: Secondary | ICD-10-CM

## 2020-01-11 DIAGNOSIS — E1169 Type 2 diabetes mellitus with other specified complication: Secondary | ICD-10-CM

## 2020-01-11 DIAGNOSIS — G609 Hereditary and idiopathic neuropathy, unspecified: Secondary | ICD-10-CM | POA: Diagnosis not present

## 2020-01-11 NOTE — Progress Notes (Signed)
   Subjective:    Patient ID: Deborah Carroll, female    DOB: 1955-11-11, 64 y.o.   MRN: [id number]  HPI  Patient arrives to discuss new sleep study. Patient needs new machine and CPAP equipment and last sleep study was 15 or so years ago Patient needs a new sleep study She is having significant trouble sleep apnea with fatigue tiredness sleepiness Her current machine is not working She was told by home health people that she needs a new study in order to get a new machine Review of Systems See above    Objective:   Physical Exam Lungs clear heart regular extremities trace edema       Assessment & Plan:  Sleep apnea Referral for sleep study Will need a new sleep apnea machine But needs new study first Patient will do lab work in the near future and she will follow-up for further discussion

## 2020-01-12 ENCOUNTER — Other Ambulatory Visit (HOSPITAL_COMMUNITY): Payer: Self-pay | Admitting: Family Medicine

## 2020-01-12 DIAGNOSIS — Z1231 Encounter for screening mammogram for malignant neoplasm of breast: Secondary | ICD-10-CM

## 2020-01-12 DIAGNOSIS — N644 Mastodynia: Secondary | ICD-10-CM

## 2020-01-12 NOTE — Progress Notes (Signed)
Split Sleep study order placed

## 2020-01-12 NOTE — Addendum Note (Signed)
Addended by: Vicente Males on: 01/12/2020 09:27 AM   Modules accepted: Orders

## 2020-01-18 ENCOUNTER — Ambulatory Visit (HOSPITAL_COMMUNITY)
Admission: RE | Admit: 2020-01-18 | Discharge: 2020-01-18 | Disposition: A | Discharge status: Home / Self Care | Payer: PPO | Source: Ambulatory Visit | Attending: Family Medicine | Admitting: Family Medicine

## 2020-01-18 ENCOUNTER — Other Ambulatory Visit: Payer: Self-pay

## 2020-01-18 ENCOUNTER — Ambulatory Visit (HOSPITAL_COMMUNITY): Admission: RE | Admit: 2020-01-18 | Payer: PPO | Source: Ambulatory Visit

## 2020-01-18 DIAGNOSIS — Z1231 Encounter for screening mammogram for malignant neoplasm of breast: Secondary | ICD-10-CM | POA: Insufficient documentation

## 2020-01-18 DIAGNOSIS — R921 Mammographic calcification found on diagnostic imaging of breast: Secondary | ICD-10-CM | POA: Diagnosis not present

## 2020-01-18 DIAGNOSIS — N644 Mastodynia: Secondary | ICD-10-CM | POA: Insufficient documentation

## 2020-01-20 ENCOUNTER — Telehealth: Payer: Self-pay

## 2020-01-20 MED ORDER — HYDROCODONE-ACETAMINOPHEN 10-325 MG PO TABS
ORAL_TABLET | ORAL | 0 refills | Status: DC
Start: 2020-01-20 — End: 2020-02-01

## 2020-01-20 NOTE — Telephone Encounter (Signed)
Last pain management 09/10/19 and has up coming appt on 10/26. Pt was seen 10/5 for a check up and thought dr Nicki Reaper was refilling pain med at that time. States she takes one tid and has 2 pills left for today.  [facility] [address]

## 2020-01-20 NOTE — Telephone Encounter (Signed)
Pt.notified

## 2020-01-20 NOTE — Telephone Encounter (Signed)
She needs refill on Hydrocodone.  Thought Dr. Nicki Reaper was sending a refill last week.  Has upcoming appt on 02-01-20   [facility]

## 2020-02-01 ENCOUNTER — Ambulatory Visit (INDEPENDENT_AMBULATORY_CARE_PROVIDER_SITE_OTHER): Payer: PPO | Admitting: Family Medicine

## 2020-02-01 ENCOUNTER — Other Ambulatory Visit: Payer: Self-pay

## 2020-02-01 ENCOUNTER — Encounter: Payer: Self-pay | Admitting: Family Medicine

## 2020-02-01 VITALS — BP 134/84 | HR 82 | Temp 97.6°F | Wt 221.8 lb

## 2020-02-01 DIAGNOSIS — Z1329 Encounter for screening for other suspected endocrine disorder: Secondary | ICD-10-CM | POA: Diagnosis not present

## 2020-02-01 DIAGNOSIS — R569 Unspecified convulsions: Secondary | ICD-10-CM | POA: Diagnosis not present

## 2020-02-01 DIAGNOSIS — G4733 Obstructive sleep apnea (adult) (pediatric): Secondary | ICD-10-CM | POA: Diagnosis not present

## 2020-02-01 DIAGNOSIS — R7303 Prediabetes: Secondary | ICD-10-CM

## 2020-02-01 DIAGNOSIS — G609 Hereditary and idiopathic neuropathy, unspecified: Secondary | ICD-10-CM

## 2020-02-01 DIAGNOSIS — Z79891 Long term (current) use of opiate analgesic: Secondary | ICD-10-CM

## 2020-02-01 DIAGNOSIS — R634 Abnormal weight loss: Secondary | ICD-10-CM

## 2020-02-01 DIAGNOSIS — E1169 Type 2 diabetes mellitus with other specified complication: Secondary | ICD-10-CM | POA: Diagnosis not present

## 2020-02-01 DIAGNOSIS — E785 Hyperlipidemia, unspecified: Secondary | ICD-10-CM | POA: Diagnosis not present

## 2020-02-01 MED ORDER — SERTRALINE HCL 100 MG PO TABS
ORAL_TABLET | ORAL | 1 refills | Status: DC
Start: 2020-02-01 — End: 2020-04-27

## 2020-02-01 MED ORDER — ROSUVASTATIN CALCIUM 5 MG PO TABS: ORAL_TABLET | ORAL | 1 refills | Status: DC

## 2020-02-01 MED ORDER — HYDROCODONE-ACETAMINOPHEN 10-325 MG PO TABS: ORAL_TABLET | ORAL | 0 refills | Status: DC

## 2020-02-01 MED ORDER — POTASSIUM CHLORIDE CRYS ER 10 MEQ PO TBCR: EXTENDED_RELEASE_TABLET | ORAL | 1 refills | Status: DC

## 2020-02-01 MED ORDER — PANTOPRAZOLE SODIUM 40 MG PO TBEC: DELAYED_RELEASE_TABLET | ORAL | 1 refills | Status: DC

## 2020-02-01 MED ORDER — HYDROCHLOROTHIAZIDE 25 MG PO TABS: ORAL_TABLET | ORAL | 1 refills | Status: DC

## 2020-02-01 MED ORDER — PHENYTOIN SODIUM EXTENDED 100 MG PO CAPS: ORAL_CAPSULE | ORAL | 1 refills | Status: DC

## 2020-02-01 NOTE — Progress Notes (Signed)
Subjective:    Patient ID: Deborah Carroll, female    DOB: [birth year], 64 y.o.   MRN: [id number]  HPI  This patient was seen today for chronic pain  The medication list was reviewed and updated.   -Compliance with medication: yes  - Number patient states they take daily: taking every 6 hours lately   -when was the last dose patient took? This morning  The patient was advised the importance of maintaining medication and not using illegal substances with these.  Here for refills and follow up  The patient was educated that we can provide 3 monthly scripts for their medication, it is their responsibility to follow the instructions.  Side effects or complications from medications: none  Patient is aware that pain medications are meant to minimize the severity of the pain to allow their pain levels to improve to allow for better function. They are aware of that pain medications cannot totally remove their pain.  Due for UDT ( at least once per year) : done today  Scale of 1 to 10 ( 1 is least 10 is most) Your pain level without the medicine: 8 Your pain level with medication four Milligrams Scale 1 to 10 ( 1-helps very little, 10 helps very well) How well does your pain medication reduce your pain so you can function better through out the day?  7   Patient has complaints of worsening neuropathy in legs and feet. Also mentions feeling a small knot on the back of her left leg.   Results for orders placed or performed in visit on 01/03/20  Tissue transglutaminase, IgA  Result Value Ref Range   Transglutaminase IgA 3 0 - 3 U/mL  IgA  Result Value Ref Range   IgA/Immunoglobulin A, Serum 378 (H) 87 - 352 mg/dL     Review of Systems  Constitutional: Negative for activity change, appetite change and fatigue.  HENT: Negative for congestion and rhinorrhea.   Respiratory: Negative for cough and shortness of breath.   Cardiovascular: Negative for chest pain and leg swelling.    Gastrointestinal: Negative for abdominal pain and diarrhea.  Endocrine: Negative for polydipsia and polyphagia.  Musculoskeletal: Positive for back pain. Negative for arthralgias.  Skin: Negative for color change.  Neurological: Positive for numbness. Negative for dizziness and weakness.  Psychiatric/Behavioral: Negative for behavioral problems and confusion.       Objective:   Physical Exam Vitals reviewed.  Constitutional:      General: She is not in acute distress. HENT:     Head: Normocephalic.  Cardiovascular:     Rate and Rhythm: Normal rate and regular rhythm.     Heart sounds: Normal heart sounds. No murmur heard.   Pulmonary:     Effort: Pulmonary effort is normal.     Breath sounds: Normal breath sounds.  Lymphadenopathy:     Cervical: No cervical adenopathy.  Neurological:     Mental Status: She is alert.  Psychiatric:        Behavior: Behavior normal.    No edema in the lower legs Does have subjective numbness in the lower legs      Assessment & Plan:  1. Encounter for long-term opiate analgesic use The patient was seen in followup for chronic pain. A review over at their current pain status was discussed. Drug registry was checked. Prescriptions were given.  Regular follow-up recommended. Discussion was held regarding the importance of compliance with medication as well as pain medication contract.  Patient  was informed that medication may cause drowsiness and should not be combined  with other medications/alcohol or street drugs. If the patient feels medication is causing altered alertness then do not drive or operate dangerous equipment.  Pain medications allow her to function better.  Without it she did have a very difficult time she takes anywhere between 2 to 3/day - ToxASSU[employer] Select 13 (MW), Urine  2. Obstructive sleep apnea She has a symptoms of sleep apnea we did a previous referral apparently it went nowhere so therefore referred him for a  split-night study - Split night study  3. Seizures ([place]) Seizure is under good control takes her medications none recently  4. Prediabetes Prediabetes under decent control try to watch her diet staying active  5. Morbid obesity ([place]) Portion control regular activity try to lose weight  6. Idiopathic peripheral neuropathy Progressively getting worse in the lower legs.  May well need to have neurology do nerve conduction studies and further evaluation  7. Weight loss Weight loss unintended I believe the patient needs EGD sees gastroenterology coming up they will help decide this  Patient defers on Covid vaccine long discussion held on this follow-up in 3 months

## 2020-02-02 LAB — BASIC METABOLIC PANEL
BUN/Creatinine Ratio: 14 (ref 12–28)
BUN: 11 mg/dL (ref 8–27)
CO2: 26 mmol/L (ref 20–29)
Calcium: 9.5 mg/dL (ref 8.7–10.3)
Chloride: 94 mmol/L — ABNORMAL LOW (ref 96–106)
Creatinine, Ser: 0.79 mg/dL (ref 0.57–1.00)
GFR calc Af Amer: 92 mL/min/{1.73_m2} (ref >59)
GFR calc non Af Amer: 80 mL/min/{1.73_m2} (ref >59)
Glucose: 237 mg/dL — ABNORMAL HIGH (ref 65–99)
Potassium: 3 mmol/L — ABNORMAL LOW (ref 3.5–5.2)
Sodium: 138 mmol/L (ref 134–144)

## 2020-02-02 LAB — LIPID PANEL
Chol/HDL Ratio: 2.8 ratio (ref 0.0–4.4)
Cholesterol, Total: 144 mg/dL (ref 100–199)
HDL: 52 mg/dL (ref >39)
LDL Chol Calc (NIH): 68 mg/dL (ref 0–99)
Triglycerides: 140 mg/dL (ref 0–149)
VLDL Cholesterol Cal: 24 mg/dL (ref 5–40)

## 2020-02-02 LAB — HEMOGLOBIN A1C
Est. average glucose Bld gHb Est-mCnc: 146 mg/dL
Hgb A1c MFr Bld: 6.7 % — ABNORMAL HIGH (ref 4.8–5.6)

## 2020-02-02 LAB — TSH: TSH: 4.23 u[IU]/mL (ref 0.450–4.500)

## 2020-02-02 LAB — T4, FREE: Free T4: 1.01 ng/dL (ref 0.82–1.77)

## 2020-02-02 LAB — T3, FREE: T3, Free: 3.2 pg/mL (ref 2.0–4.4)

## 2020-02-04 LAB — TOXASSURE SELECT 13 (MW), URINE

## 2020-02-08 ENCOUNTER — Other Ambulatory Visit: Payer: Self-pay | Admitting: *Deleted

## 2020-02-08 DIAGNOSIS — E119 Type 2 diabetes mellitus without complications: Secondary | ICD-10-CM

## 2020-02-08 DIAGNOSIS — E1169 Type 2 diabetes mellitus with other specified complication: Secondary | ICD-10-CM

## 2020-02-08 DIAGNOSIS — R5383 Other fatigue: Secondary | ICD-10-CM

## 2020-02-08 DIAGNOSIS — E785 Hyperlipidemia, unspecified: Secondary | ICD-10-CM

## 2020-02-08 DIAGNOSIS — Z79899 Other long term (current) drug therapy: Secondary | ICD-10-CM

## 2020-02-08 MED ORDER — METFORMIN HCL 500 MG PO TABS: ORAL_TABLET | ORAL | 5 refills | Status: DC

## 2020-02-11 ENCOUNTER — Other Ambulatory Visit: Payer: Self-pay | Admitting: Family Medicine

## 2020-02-11 DIAGNOSIS — R7303 Prediabetes: Secondary | ICD-10-CM

## 2020-02-11 DIAGNOSIS — E1169 Type 2 diabetes mellitus with other specified complication: Secondary | ICD-10-CM

## 2020-03-03 ENCOUNTER — Other Ambulatory Visit: Payer: Self-pay

## 2020-03-03 ENCOUNTER — Ambulatory Visit: Payer: PPO | Attending: Family Medicine | Admitting: Neurology

## 2020-03-06 ENCOUNTER — Other Ambulatory Visit: Payer: Self-pay

## 2020-03-06 ENCOUNTER — Ambulatory Visit: Payer: PPO | Admitting: Gastroenterology

## 2020-03-06 ENCOUNTER — Encounter: Payer: Self-pay | Admitting: Gastroenterology

## 2020-03-06 ENCOUNTER — Encounter: Payer: Self-pay | Admitting: *Deleted

## 2020-03-06 VITALS — BP 137/75 | HR 66 | Temp 96.8°F | Ht 62.0 in | Wt 227.0 lb

## 2020-03-06 DIAGNOSIS — K582 Mixed irritable bowel syndrome: Secondary | ICD-10-CM

## 2020-03-06 DIAGNOSIS — R748 Abnormal levels of other serum enzymes: Secondary | ICD-10-CM

## 2020-03-06 DIAGNOSIS — R630 Anorexia: Secondary | ICD-10-CM | POA: Diagnosis not present

## 2020-03-06 DIAGNOSIS — R110 Nausea: Secondary | ICD-10-CM | POA: Diagnosis not present

## 2020-03-06 DIAGNOSIS — R6881 Early satiety: Secondary | ICD-10-CM | POA: Insufficient documentation

## 2020-03-06 NOTE — Patient Instructions (Signed)
1. Continue pantoprazole 40 mg daily for reflux. 2. Continue dicyclomine 10 mg every morning for abdominal cramping and loose stools.  May take up to 3 times per day if needed.  Hold if you get constipation. 3. Upper endoscopy as scheduled.  Please see separate instructions. 4. Please take your labs with you the next time you have blood work for Dr. Wolfgang Phoenix.

## 2020-03-06 NOTE — Progress Notes (Signed)
Primary Care Physician: Kathyrn Drown, MD  Primary Gastroenterologist:  Elon Alas. Abbey Chatters, DO   Chief Complaint  Patient presents with  . Irritable Bowel Syndrome    stool is "mushy"    HPI: Deborah Carroll is a 64 y.o. female here for follow-up.  Last seen in September.  At that time she had approximately 20 pound weight loss unintentionally, complained of early satiety, nausea and abdominal pain.  CT abdomen and pelvis with contrast in July 2021 showed moderate colonic stool burden, fluid distended stomach but otherwise unremarkable.  When I saw her in September, she was feeling better.  Early satiety had improved but appetite not normal.  Some postprandial nausea.  We offered her an upper endoscopy but she wanted to hold off.  She was also having some lower abdominal cramping and loose stool.  Celiac screen was negative.  Started on low-dose dicyclomine.  Thyroid labs were normal.  Weight has been stable over the past 6 months.  Overall weight down about 20 pounds in the past year unintentionally.  Lower abdominal cramping is better.  Still has it at times.  Bowel movements about the same, can skip a few days without a bowel movement, other days have up to 3 mushy stools daily.  Started Metformin about 3 weeks ago.  Really does not notice any worsening of her bowel function at this time on low-dose Metformin.  Appetite remains somewhat poor.  She does not typically eat breakfast.  After eating lunch she does not get hungry for the rest of the day.  She continues to have some postprandial nausea but no vomiting.  Denies any heartburn on pantoprazole.  Denies any NSAID or aspirin use.  Current Outpatient Medications  Medication Sig Dispense Refill  . dicyclomine (BENTYL) 10 MG capsule Take one capsule every morning to prevent diarrhea. May take up to three times a day for abdominal cramping and loose stools. 90 capsule 2  . hydrochlorothiazide (HYDRODIURIL) 25 MG tablet TAKE 1  TABLET(25 MG) BY MOUTH DAILY 90 tablet 1  . HYDROcodone-acetaminophen (NORCO) 10-325 MG tablet Take one tablet po TID prn pain 90 tablet 0  . HYDROcodone-acetaminophen (NORCO) 10-325 MG tablet Take one tablet po TID prn pain 90 tablet 0  . HYDROcodone-acetaminophen (NORCO) 10-325 MG tablet Take one tablet po bid prn pain 10 tablet 0  . metFORMIN (GLUCOPHAGE) 500 MG tablet Take 1 tablet daily. 30 tablet 5  . pantoprazole (PROTONIX) 40 MG tablet TAKE 1 TABLET(40 MG) BY MOUTH DAILY 90 tablet 1  . phenytoin (DILANTIN) 100 MG ER capsule TAKE 2 CAPSULES BY MOUTH EVERY MORNING AND 2 CAPSULE BY MOUTH EVERY EVENING 360 capsule 1  . potassium chloride (KLOR-CON) 10 MEQ tablet Take one tablet po BID 180 tablet 1  . rosuvastatin (CRESTOR) 5 MG tablet TAKE 1 TABLET(5 MG) BY MOUTH DAILY 90 tablet 1  . sertraline (ZOLOFT) 100 MG tablet TAKE 1 TABLET(100 MG) BY MOUTH DAILY 90 tablet 1  . torsemide (DEMADEX) 20 MG tablet Take 1 tablet (20 mg total) by mouth daily as needed (fluid or swelling). 30 tablet 2   No current facility-administered medications for this visit.    Allergies as of 03/06/2020  . (No Known Allergies)   Past Medical History:  Diagnosis Date  . Anxiety   . Chronic pain   . Depression   . Hypertension   . Morbid obesity ([place]) 03/20/2018  . Neuropathy   . Peripheral neuropathy   . Seizures (  [place])    last one 1991- Increased BP with delivery and is on meds still.  . Sleep apnea    CPAP   Past Surgical History:  Procedure Laterality Date  . ABDOMINAL HYSTERECTOMY     ovaries present, no cancer,approx 2001  . BACK SURGERY    . CARPAL TUNNEL RELEASE     Bilateral  . CATARACT EXTRACTION W/PHACO Right 06/20/2015   Procedure: CATARACT EXTRACTION PHACO AND INTRAOCULAR LENS PLACEMENT (IOC);  Surgeon: Rutherford Guys, MD;  Location: [facility];  Service: Ophthalmology;  Laterality: Right;  CDE:6.25  . CATARACT EXTRACTION W/PHACO Left 08/29/2015   Procedure: CATARACT EXTRACTION PHACO AND  INTRAOCULAR LENS PLACEMENT (IOC);  Surgeon: Rutherford Guys, MD;  Location: [facility];  Service: Ophthalmology;  Laterality: Left;  CDE: 4.11  . CESAREAN SECTION     x2  . CHOLECYSTECTOMY    . COLONOSCOPY  10/14/2002   ERX:[id number] involving descending colon, splenic flexure and distal transverse colon with most extensive inflammation in the splenic flexure area.  These changes are typical of ischemic colitis.  Biopsy taken.  . COLONOSCOPY N/A 08/04/2012   SLF: POLYPOID LESION IN AT THE APPENDICEAL ORIFICE/Polyp  in the descending colon/Moderate melanosis throughout the entire examined colon/ The colon IS redundant. small tubular adenoma.   . COLONOSCOPY WITH PROPOFOL N/A 11/17/2018   3 tubular adenomas removed, 3 hyperplastic polyps, next colonoscopy in 11/2021.   [facility] FOOT SURGERY Bilateral 2004   hammer toe repairs  . PARS PLANA VITRECTOMY W/ REPAIR OF MACULAR HOLE    . POLYPECTOMY  11/17/2018   Procedure: POLYPECTOMY;  Surgeon: Danie Binder, MD;  Location: [facility];  Service: Endoscopy;;   Family History  Problem Relation Age of Onset  . Colon polyps Mother 83       benign   . Hypertension Maternal Grandmother   . Heart attack Maternal Grandfather   . Colon cancer Neg Hx   . Liver disease Neg Hx    Social History   Tobacco Use  . Smoking status: Never Smoker  . Smokeless tobacco: Never Used  Substance Use Topics  . Alcohol use: Yes    Comment: rare  . Drug use: No    ROS:  General: Negative for   fever, chills, fatigue, weakness.  See HPI ENT: Negative for hoarseness, difficulty swallowing , nasal congestion. CV: Negative for chest pain, angina, palpitations, dyspnea on exertion, peripheral edema.  Respiratory: Negative for dyspnea at rest, dyspnea on exertion, cough, sputum, wheezing.  GI: See history of present illness. GU:  Negative for dysuria, hematuria, urinary incontinence, urinary frequency, nocturnal urination.  Endo: Negative for unusual weight change.      Physical Examination:   BP 137/75   Pulse 66   Temp (!) 96.8 F (36 C) (Temporal)   Ht 5\' 2"  (1.575 m)   Wt 227 lb (103 kg)   BMI 41.52 kg/m   General: Well-nourished, well-developed in no acute distress.  Eyes: No icterus. Mouth: masked Lungs: Clear to auscultation bilaterally.  Heart: Regular rate and rhythm, no murmurs rubs or gallops.  Abdomen: Bowel sounds are normal, nontender, nondistended, no hepatosplenomegaly or masses, no abdominal bruits or hernia , no rebound or guarding.  Rectus diastases Extremities: No lower extremity edema. No clubbing or deformities. Neuro: Alert and oriented x 4   Skin: Warm and dry, no jaundice.   Psych: Alert and cooperative, normal mood and affect.  Labs:  Lab Results  Component Value Date   CREATININE 0.79 02/01/2020  BUN 11 02/01/2020   NA 138 02/01/2020   K 3.0 (L) 02/01/2020   CL 94 (L) 02/01/2020   CO2 26 02/01/2020   Lab Results  Component Value Date   ALT 20 09/27/2019   AST 22 09/27/2019   ALKPHOS 140 (H) 09/27/2019   BILITOT 0.4 09/27/2019   Lab Results  Component Value Date   WBC 10.2 09/27/2019   HGB 14.2 09/27/2019   HCT 41.0 09/27/2019   MCV 86 09/27/2019   PLT 170 09/27/2019   Lab Results  Component Value Date   TSH 4.230 02/01/2020     Lab Results  Component Value Date   HGBA1C 6.7 (H) 02/01/2020    Imaging Studies: No results found.  Impression/plan:  Pleasant 64 year old female presenting for follow-up of history of weight loss, early satiety, poor appetite, nausea.  Weight loss: Down about 20 pounds over the past year, fortunately no further weight loss over the past 6 months.  Her appetite remains poor.  She continues to have early satiety and postprandial nausea.  She is chronically on PPI, denies NSAID or aspirin use.  Gastritis or peptic ulcer disease less likely but not entirely excluded.  Differential also includes partial gastric outlet obstruction, gastroparesis and malignancy.   Recently started on Metformin for diabetes.  Chronically on pain medication.  Both which could contribute to gastroparesis.  Patient is now willing to pursue upper endoscopy as previously offered.  Previously she declined because she had experienced waking up in the middle of upper endoscopy in the remote past.  Plan for upper endoscopy with Dr. Abbey Chatters with propofol in the near future. ASA III.  I have discussed the risks, alternatives, benefits with regards to but not limited to the risk of reaction to medication, bleeding, infection, perforation and the patient is agreeable to proceed. Written consent to be obtained.  Lower abdominal cramping, intermittent loose stools.  Somewhat improved on dicyclomine.  Celiac screen was negative.  She is up-to-date on colonoscopy.  Symptoms can be worsened with Metformin, however on low-dose she is not experiencing symptoms.  We will continue to monitor for now.  Chronic elevation of alkaline phosphatase.  Recheck LFTs along with AMA with next labs.

## 2020-03-13 DIAGNOSIS — G4733 Obstructive sleep apnea (adult) (pediatric): Secondary | ICD-10-CM | POA: Diagnosis not present

## 2020-03-13 DIAGNOSIS — H0289 Other specified disorders of eyelid: Secondary | ICD-10-CM | POA: Diagnosis not present

## 2020-03-13 DIAGNOSIS — H02831 Dermatochalasis of right upper eyelid: Secondary | ICD-10-CM | POA: Diagnosis not present

## 2020-03-13 DIAGNOSIS — H02412 Mechanical ptosis of left eyelid: Secondary | ICD-10-CM | POA: Diagnosis not present

## 2020-03-13 DIAGNOSIS — H02834 Dermatochalasis of left upper eyelid: Secondary | ICD-10-CM | POA: Diagnosis not present

## 2020-03-14 ENCOUNTER — Other Ambulatory Visit: Payer: Self-pay

## 2020-03-14 MED ORDER — PHENYTOIN SODIUM EXTENDED 100 MG PO CAPS
ORAL_CAPSULE | ORAL | 1 refills | Status: DC
Start: 2020-03-14 — End: 2020-08-07

## 2020-03-14 NOTE — Telephone Encounter (Signed)
Pt needs refill on phenytoin (DILANTIN) 100 MG ER capsule sent to [facility], [address] AT [place]  Pt call back [phone]

## 2020-03-20 ENCOUNTER — Ambulatory Visit (INDEPENDENT_AMBULATORY_CARE_PROVIDER_SITE_OTHER): Payer: PPO | Admitting: Family Medicine

## 2020-03-20 ENCOUNTER — Other Ambulatory Visit: Payer: Self-pay

## 2020-03-20 ENCOUNTER — Emergency Department (HOSPITAL_COMMUNITY)
Admission: EM | Admit: 2020-03-20 | Discharge: 2020-03-20 | Disposition: A | Discharge status: Home / Self Care | Payer: PPO | Attending: Emergency Medicine | Admitting: Emergency Medicine

## 2020-03-20 ENCOUNTER — Emergency Department (HOSPITAL_COMMUNITY): Payer: PPO

## 2020-03-20 ENCOUNTER — Encounter: Payer: Self-pay | Admitting: Family Medicine

## 2020-03-20 ENCOUNTER — Encounter (HOSPITAL_COMMUNITY): Payer: Self-pay | Admitting: Emergency Medicine

## 2020-03-20 VITALS — BP 126/80 | HR 82 | Temp 97.7°F | Ht 62.0 in | Wt 221.4 lb

## 2020-03-20 DIAGNOSIS — R079 Chest pain, unspecified: Secondary | ICD-10-CM | POA: Diagnosis not present

## 2020-03-20 DIAGNOSIS — R7303 Prediabetes: Secondary | ICD-10-CM | POA: Insufficient documentation

## 2020-03-20 DIAGNOSIS — R0789 Other chest pain: Secondary | ICD-10-CM | POA: Insufficient documentation

## 2020-03-20 DIAGNOSIS — E876 Hypokalemia: Secondary | ICD-10-CM | POA: Insufficient documentation

## 2020-03-20 DIAGNOSIS — G4730 Sleep apnea, unspecified: Secondary | ICD-10-CM

## 2020-03-20 DIAGNOSIS — I10 Essential (primary) hypertension: Secondary | ICD-10-CM | POA: Insufficient documentation

## 2020-03-20 DIAGNOSIS — R140 Abdominal distension (gaseous): Secondary | ICD-10-CM | POA: Diagnosis not present

## 2020-03-20 DIAGNOSIS — R110 Nausea: Secondary | ICD-10-CM | POA: Diagnosis not present

## 2020-03-20 LAB — BASIC METABOLIC PANEL
Anion gap: 10 (ref 5–15)
BUN: 10 mg/dL (ref 8–23)
CO2: 27 mmol/L (ref 22–32)
Calcium: 9.5 mg/dL (ref 8.9–10.3)
Chloride: 102 mmol/L (ref 98–111)
Creatinine, Ser: 0.58 mg/dL (ref 0.44–1.00)
GFR, Estimated: >60 mL/min (ref >60)
Glucose, Bld: 113 mg/dL — ABNORMAL HIGH (ref 70–99)
Potassium: 3.1 mmol/L — ABNORMAL LOW (ref 3.5–5.1)
Sodium: 139 mmol/L (ref 135–145)

## 2020-03-20 LAB — CBC
HCT: 43.4 % (ref 36.0–46.0)
Hemoglobin: 14.4 g/dL (ref 12.0–15.0)
MCH: 29.5 pg (ref 26.0–34.0)
MCHC: 33.2 g/dL (ref 30.0–36.0)
MCV: 88.9 fL (ref 80.0–100.0)
Platelets: 229 10*3/uL (ref 150–400)
RBC: 4.88 MIL/uL (ref 3.87–5.11)
RDW: 13 % (ref 11.5–15.5)
WBC: 14.7 10*3/uL — ABNORMAL HIGH (ref 4.0–10.5)
nRBC: 0 % (ref 0.0–0.2)

## 2020-03-20 LAB — TROPONIN I (HIGH SENSITIVITY)
Troponin I (High Sensitivity): 4 ng/L (ref <18)
Troponin I (High Sensitivity): 3 ng/L (ref <18)

## 2020-03-20 LAB — D-DIMER, QUANTITATIVE: D-Dimer, Quant: <0.27 ug/mL-FEU (ref 0.00–0.50)

## 2020-03-20 MED ORDER — ALUM & MAG HYDROXIDE-SIMETH 200-200-20 MG/5ML PO SUSP
30.0000 mL | Freq: Once | ORAL | Status: AC
  Administered 2020-03-20: 30 mL via ORAL
  Filled 2020-03-20: qty 30

## 2020-03-20 MED ORDER — POTASSIUM CHLORIDE CRYS ER 20 MEQ PO TBCR
40.0000 meq | EXTENDED_RELEASE_TABLET | Freq: Once | ORAL | Status: DC
  Filled 2020-03-20: qty 2

## 2020-03-20 MED ORDER — FAMOTIDINE 20 MG PO TABS
20.0000 mg | ORAL_TABLET | Freq: Once | ORAL | Status: AC
  Administered 2020-03-20: 20 mg via ORAL
  Filled 2020-03-20: qty 1

## 2020-03-20 NOTE — Discharge Instructions (Signed)
You are given an ambulatory referral to cardiology.  The office will call you to schedule an appointment for follow-up.  Please return to the emergency department for any new or worsening symptoms.

## 2020-03-20 NOTE — ED Triage Notes (Signed)
Pt c/o central cp that radiates to her back since Thursday.

## 2020-03-20 NOTE — Progress Notes (Signed)
Patient ID: Deborah Carroll, female    DOB: 22-Jul-1955, 64 y.o.   MRN: [id number]   Chief Complaint  Patient presents with  . Chest Pain    Having to take deep breaths since Thursday - also having some nausea and a lot of belching and feeling bloated   Subjective:  Cc: "I want EKG"   This is a new problem.  Presents today with a chief complaint of "I need an EKG "reports that on Wednesday night she helped her husband moving something heavy in the house.  By Thursday night she was complaining of midsternal chest pain that radiated to the back, shortness of breath felt cold sweaty and clammy some nausea had monitored her blood pressure had blood pressures in the 180s over 100s and generally 130s to 140s over 70 to 80s pain on Thursday was 8/10 by Saturday it was 7-8/10 by Monday 3-4/10 today in the office 3/10 also complains of some belching.  Reports shortness of breath with taking a deep breath.  EKG done in the office, no obvious ischemia noted.    Medical History Deborah Carroll has a past medical history of Anxiety, Chronic pain, Depression, Hypertension, Morbid obesity ([place]) (03/20/2018), Neuropathy, Peripheral neuropathy, Seizures ([place]), and Sleep apnea.   Outpatient Encounter Medications as of 03/20/2020  Medication Sig  . dicyclomine (BENTYL) 10 MG capsule Take one capsule every morning to prevent diarrhea. May take up to three times a day for abdominal cramping and loose stools.  . hydrochlorothiazide (HYDRODIURIL) 25 MG tablet TAKE 1 TABLET(25 MG) BY MOUTH DAILY  . HYDROcodone-acetaminophen (NORCO) 10-325 MG tablet Take one tablet po TID prn pain  . HYDROcodone-acetaminophen (NORCO) 10-325 MG tablet Take one tablet po TID prn pain  . HYDROcodone-acetaminophen (NORCO) 10-325 MG tablet Take one tablet po bid prn pain  . metFORMIN (GLUCOPHAGE) 500 MG tablet Take 1 tablet daily.  . pantoprazole (PROTONIX) 40 MG tablet TAKE 1 TABLET(40 MG) BY MOUTH DAILY  . phenytoin (DILANTIN) 100 MG ER capsule  TAKE 2 CAPSULES BY MOUTH EVERY MORNING AND 2 CAPSULE BY MOUTH EVERY EVENING  . potassium chloride (KLOR-CON) 10 MEQ tablet Take one tablet po BID  . rosuvastatin (CRESTOR) 5 MG tablet TAKE 1 TABLET(5 MG) BY MOUTH DAILY  . sertraline (ZOLOFT) 100 MG tablet TAKE 1 TABLET(100 MG) BY MOUTH DAILY  . torsemide (DEMADEX) 20 MG tablet Take 1 tablet (20 mg total) by mouth daily as needed (fluid or swelling).   No facility-administered encounter medications on file as of 03/20/2020.     Review of Systems  Constitutional: Positive for diaphoresis. Negative for chills and fever.  Respiratory: Positive for shortness of breath.   Cardiovascular: Positive for chest pain.  Gastrointestinal: Negative for abdominal pain.     Vitals BP 126/80   Pulse 82   Temp 97.7 F (36.5 C) (Oral)   Ht 5\' 2"  (1.575 m)   Wt 221 lb 6.4 oz (100.4 kg)   SpO2 96%   BMI 40.49 kg/m   Objective:   Physical Exam Vitals reviewed.  Constitutional:      General: She is not in acute distress.    Appearance: Normal appearance.  Cardiovascular:     Rate and Rhythm: Normal rate and regular rhythm.     Heart sounds: Normal heart sounds.  Pulmonary:     Effort: Pulmonary effort is normal.     Breath sounds: Normal breath sounds.  Skin:    General: Skin is warm and dry.  Neurological:  General: No focal deficit present.     Mental Status: She is alert.  Psychiatric:        Behavior: Behavior normal.      Assessment and Plan   1. Chest pain, unspecified type - PR ELECTROCARDIOGRAM, COMPLETE    Symptoms are concerning for cardiac involvement.  Recommend to go to [facility] emergency department immediately  from this office.  She called her husband to come and get her (offered EMS).  Attempted to call triage no answer the RN will attempt to call triage again to alert of patient's arrival.  Triage was notified patient with complaint of midsternal chest pain, shortness of breath, feeling cold and sweaty and  clammy, nausea, elevated blood pressures and belching. Symptoms off and on since last Thursday night.    Agrees with plan of care discussed today. Understands warning signs to seek further care: Go to emergency department immediately.  Pecolia Ades, FNP-C

## 2020-03-20 NOTE — ED Provider Notes (Signed)
[place] [facility] Provider Note   CSN: [id number] Arrival date & time: 03/20/20  1537     History Chief Complaint  Patient presents with  . Chest Pain    Deborah Carroll is a 64 y.o. female.  HPI  HPI: A 64 year old patient with a history of treated diabetes, hypertension and obesity presents for evaluation of chest pain. Initial onset of pain was more than 6 hours ago. The patient's chest pain is sharp and is not worse with exertion. The patient complains of nausea. The patient's chest pain is middle- or left-sided, is not well-localized, is not described as heaviness/pressure/tightness and does not radiate to the arms/jaw/neck. The patient denies diaphoresis. The patient has no history of stroke, has no history of peripheral artery disease, has not smoked in the past 90 days, has no relevant family history of coronary artery disease (first degree relative at less than age 69) and has no history of hypercholesterolemia.    Pt is a 64 y/o female with a h/o anxiety, depression, hypertension, obesity, neuropathy, seizures, sleep apnea, diabetes, who presents to the ED today for eval of chest pain that started 5 days ago. Pain located to the center of the chest and radiates through to her back. States she lifted a heavy object the day prior to the onset of her sxs so she originally thought it was a muscle spasm. She reports the pain feels sharp in nature. She rates pain at a 3/10. Initially the pain was an 8/10. The severe pain lasted for 2 days. Pain is worse with deep breaths and with certain movements.   Reports some associated nausea, but denies vomiting. A few days ago she had a cold sweat and diffuse tingling that lasted for 30 seconds and resolved  Reports associated abd bloating and belching.   Denies any cough or shortness of breath. Denies leg pain/swelling, hemoptysis, recent surgery, recent long travel, hormone use, personal hx of cancer, or hx of DVT/PE.   Denies  any early fam hx of heart disease. States she has prior hx of tobacco use. Used tobacco for 5 years but quit 41 years ago.   Past Medical History:  Diagnosis Date  . Anxiety   . Chronic pain   . Depression   . Hypertension   . Morbid obesity ([place]) 03/20/2018  . Neuropathy   . Peripheral neuropathy   . Seizures ([place])    last one 1991- Increased BP with delivery and is on meds still.  . Sleep apnea    CPAP    Patient Active Problem List   Diagnosis Date Noted  . Chest pain 03/20/2020  . Early satiety 03/06/2020  . Elevated alkaline phosphatase level 03/06/2020  . Nausea without vomiting 03/06/2020  . Loss of weight 01/03/2020  . Appetite loss 01/03/2020  . Morbid obesity ([place]) 03/20/2018  . Heme positive stool 02/11/2018  . Irritable bowel syndrome with both constipation and diarrhea 07/28/2017  . Gastroesophageal reflux disease without esophagitis 07/28/2017  . Major depression 08/30/2015  . Prediabetes 05/31/2015  . Encounter for long-term opiate analgesic use 12/29/2014  . Chronic back pain 08/19/2013  . Obstructive sleep apnea 08/19/2013  . Seizures ([place]) 02/22/2013  . Peripheral edema 11/11/2012  . Peripheral neuropathy 11/11/2012  . Unspecified constipation 07/27/2012  . Bowel habit changes 07/27/2012  . ISCHEMIC COLITIS, HX OF 04/12/2009    Past Surgical History:  Procedure Laterality Date  . ABDOMINAL HYSTERECTOMY     ovaries present, no cancer,approx 2001  .  BACK SURGERY    . CARPAL TUNNEL RELEASE     Bilateral  . CATARACT EXTRACTION W/PHACO Right 06/20/2015   Procedure: CATARACT EXTRACTION PHACO AND INTRAOCULAR LENS PLACEMENT (IOC);  Surgeon: Rutherford Guys, MD;  Location: [facility];  Service: Ophthalmology;  Laterality: Right;  CDE:6.25  . CATARACT EXTRACTION W/PHACO Left 08/29/2015   Procedure: CATARACT EXTRACTION PHACO AND INTRAOCULAR LENS PLACEMENT (IOC);  Surgeon: Rutherford Guys, MD;  Location: [facility];  Service: Ophthalmology;  Laterality: Left;  CDE: 4.11  .  CESAREAN SECTION     x2  . CHOLECYSTECTOMY    . COLONOSCOPY  10/14/2002   GGE:[id number] involving descending colon, splenic flexure and distal transverse colon with most extensive inflammation in the splenic flexure area.  These changes are typical of ischemic colitis.  Biopsy taken.  . COLONOSCOPY N/A 08/04/2012   SLF: POLYPOID LESION IN AT THE APPENDICEAL ORIFICE/Polyp  in the descending colon/Moderate melanosis throughout the entire examined colon/ The colon IS redundant. small tubular adenoma.   . COLONOSCOPY WITH PROPOFOL N/A 11/17/2018   3 tubular adenomas removed, 3 hyperplastic polyps, next colonoscopy in 11/2021.   [facility] FOOT SURGERY Bilateral 2004   hammer toe repairs  . PARS PLANA VITRECTOMY W/ REPAIR OF MACULAR HOLE    . POLYPECTOMY  11/17/2018   Procedure: POLYPECTOMY;  Surgeon: Danie Binder, MD;  Location: [facility];  Service: Endoscopy;;     OB History   No obstetric history on file.     Family History  Problem Relation Age of Onset  . Colon polyps Mother 53       benign   . Hypertension Maternal Grandmother   . Heart attack Maternal Grandfather   . Colon cancer Neg Hx   . Liver disease Neg Hx     Social History   Tobacco Use  . Smoking status: Never Smoker  . Smokeless tobacco: Never Used  Substance Use Topics  . Alcohol use: Yes    Comment: rare  . Drug use: No    Home Medications Prior to Admission medications   Medication Sig Start Date End Date Taking? Authorizing Provider  acetaminophen (TYLENOL) 500 MG tablet Take 1,000 mg by mouth every 6 (six) hours as needed.   Yes [provider]  APPLE CIDER VINEGAR PO Take 1 tablet by mouth daily.   Yes [provider]  Cinnamon 500 MG capsule Take 500 mg by mouth daily.   Yes [provider]  dicyclomine (BENTYL) 10 MG capsule Take one capsule every morning to prevent diarrhea. May take up to three times a day for abdominal cramping and loose stools. 01/03/20  Yes Mahala Menghini,  PA-C  hydrochlorothiazide (HYDRODIURIL) 25 MG tablet TAKE 1 TABLET(25 MG) BY MOUTH DAILY 02/01/20  Yes Kathyrn Drown, MD  HYDROcodone-acetaminophen [facility]) 10-325 MG tablet Take one tablet po TID prn pain 02/01/20  Yes Luking, Scott A, MD  metFORMIN (GLUCOPHAGE) 500 MG tablet Take 1 tablet daily. 02/08/20  Yes Kathyrn Drown, MD  pantoprazole (PROTONIX) 40 MG tablet TAKE 1 TABLET(40 MG) BY MOUTH DAILY 02/01/20  Yes Luking, Scott A, MD  phenytoin (DILANTIN) 100 MG ER capsule TAKE 2 CAPSULES BY MOUTH EVERY MORNING AND 2 CAPSULE BY MOUTH EVERY EVENING 03/14/20  Yes Kathyrn Drown, MD  potassium chloride (KLOR-CON) 10 MEQ tablet Take one tablet po BID 02/01/20  Yes Luking, Scott A, MD  rosuvastatin (CRESTOR) 5 MG tablet TAKE 1 TABLET(5 MG) BY MOUTH DAILY 02/01/20  Yes Luking, Scott A,  MD  sertraline (ZOLOFT) 100 MG tablet TAKE 1 TABLET(100 MG) BY MOUTH DAILY 02/01/20  Yes Luking, Elayne Snare, MD  torsemide (DEMADEX) 20 MG tablet Take 1 tablet (20 mg total) by mouth daily as needed (fluid or swelling). 09/10/19  Yes Kathyrn Drown, MD  HYDROcodone-acetaminophen (NORCO) 10-325 MG tablet Take one tablet po TID prn pain Patient not taking: No sig reported 02/01/20   Kathyrn Drown, MD  HYDROcodone-acetaminophen [facility]) 10-325 MG tablet Take one tablet po bid prn pain Patient not taking: No sig reported 02/01/20   Kathyrn Drown, MD    Allergies    Patient has no known allergies.  Review of Systems   Review of Systems  Constitutional: Negative for chills and fever.  HENT: Negative for ear pain and sore throat.   Eyes: Negative for pain and visual disturbance.  Respiratory: Negative for cough and shortness of breath.   Cardiovascular: Positive for chest pain.  Gastrointestinal: Negative for abdominal pain, constipation, diarrhea, nausea and vomiting.  Genitourinary: Negative for dysuria and hematuria.  Musculoskeletal: Positive for back pain. Negative for arthralgias.  Skin: Negative for rash.   Neurological: Negative for headaches.  All other systems reviewed and are negative.   Physical Exam Updated Vital Signs BP (!) 148/81 (BP Location: Right Arm)   Pulse 74   Temp 98.9 F (37.2 C) (Oral)   Resp 17   Ht 5\' 2"  (1.575 m)   Wt 99.8 kg   SpO2 95%   BMI 40.24 kg/m   Physical Exam Vitals and nursing note reviewed.  Constitutional:      General: She is not in acute distress.    Appearance: She is well-developed and well-nourished.  HENT:     Head: Normocephalic and atraumatic.  Eyes:     Conjunctiva/sclera: Conjunctivae normal.  Cardiovascular:     Rate and Rhythm: Normal rate and regular rhythm.     Pulses:          Dorsalis pedis pulses are 2+ on the right side and 2+ on the left side.     Heart sounds: No murmur heard.   Pulmonary:     Effort: Pulmonary effort is normal. No respiratory distress.     Breath sounds: Normal breath sounds. No decreased breath sounds, wheezing, rhonchi or rales.  Abdominal:     Palpations: Abdomen is soft.     Tenderness: There is no abdominal tenderness.  Musculoskeletal:        General: No edema.     Cervical back: Neck supple.     Right lower leg: No tenderness. No edema.     Left lower leg: No tenderness. No edema.  Skin:    General: Skin is warm and dry.  Neurological:     Mental Status: She is alert.  Psychiatric:        Mood and Affect: Mood and affect normal.     ED Results / Procedures / Treatments   Labs (all labs ordered are listed, but only abnormal results are displayed) Labs Reviewed  BASIC METABOLIC PANEL - Abnormal; Notable for the following components:      Result Value   Potassium 3.1 (*)    Glucose, Bld 113 (*)    All other components within normal limits  CBC - Abnormal; Notable for the following components:   WBC 14.7 (*)    All other components within normal limits  D-DIMER, QUANTITATIVE (NOT AT [facility])  URINALYSIS, ROUTINE W REFLEX MICROSCOPIC  TROPONIN I (HIGH SENSITIVITY)  TROPONIN I  (HIGH SENSITIVITY)    EKG EKG Interpretation  Date/Time:  Monday March 20 2020 20:18:33 EST Ventricular Rate:  65 PR Interval:    QRS Duration: 108 QT Interval:  509 QTC Calculation: 530 R Axis:   59 Text Interpretation: Sinus rhythm Low voltage, precordial leads Nonspecific T abnrm, anterolateral leads Prolonged QT interval Confirmed by Milton Ferguson [phone]) on 03/20/2020 9:35:45 PM   Radiology DG Chest 2 View  Result Date: 03/20/2020 CLINICAL DATA:  Chest pain since last Thursday radiating to back, former smoker, type II diabetes mellitus EXAM: CHEST - 2 VIEW COMPARISON:  08/13/2017 FINDINGS: Upper normal heart size. Mediastinal contours and pulmonary vascularity normal. Chronic accentuation of bibasilar markings greater on RIGHT unchanged. No definite acute infiltrate, pleural effusion or pneumothorax. Scattered endplate spur formation thoracic spine. IMPRESSION: Chronic bibasilar accentuation of markings, stable. No acute abnormalities. Electronically Signed   By: Lavonia Dana M.D.   On: 03/20/2020 16:29    Procedures Procedures (including critical care time)  Medications Ordered in ED Medications  potassium chloride SA (KLOR-CON) CR tablet 40 mEq (has no administration in time range)  alum & mag hydroxide-simeth (MAALOX/MYLANTA) 200-200-20 MG/5ML suspension 30 mL (30 mLs Oral Given 03/20/20 2033)  famotidine (PEPCID) tablet 20 mg (20 mg Oral Given 03/20/20 2032)    ED Course  I have reviewed the triage vital signs and the nursing notes.  Pertinent labs & imaging results that were available during my care of the patient were reviewed by me and considered in my medical decision making (see chart for details).    MDM Rules/Calculators/A&P HEAR Score: 87                        64 year old female presenting for evaluation of chest pain ongoing for the last 5 days.  Symptoms started after lifting a heavy object.  Reviewed/interpreted lab CBC is without leukocytosis or  significant anemia BMP with mild hypokalemia, otherwise reassuring Troponins are negative x2 D-dimer negative making PE less likely  EKG - NSR, low voltage in precordial leads, nonspecific t abnormalities ain the anterolateral leads, prolonged QTc. Does not appear to be significantly changed from prior  Chest x-ray - Chronic bibasilar accentuation of markings, stable. No acute abnormalities.  HEART SCORE 4. Pt sxs seem atypical in nature and I have low suspicion for ACS.  She was given a GI cocktail and Pepcid in the ED and on reassessment her symptoms felt improved.  This may be more GI related the and also considering MSK cause given worse with movement in setting of recent heavy lifting.  Her D-dimer was also negative making PE less likely.  I have extremely low suspicion for dissection or other emergent cause of symptoms at this time.  Feel she is appropriate for outpatient follow-up and I will give her a referral to cardiology.  Have advised on close follow-up and strict return precautions.  She voiced understanding the plan reasons to return.  Questions answered.  Patient stable for discharge.   Final Clinical Impression(s) / ED Diagnoses Final diagnoses:  Atypical chest pain    Rx / DC Orders ED Discharge Orders         Ordered    Ambulatory referral to Cardiology        03/20/20 2142           Bishop Dublin 03/20/20 2143    Milton Ferguson, MD 03/21/20 2215

## 2020-03-21 ENCOUNTER — Encounter: Payer: Self-pay | Admitting: Diagnostic Neuroimaging

## 2020-03-21 ENCOUNTER — Ambulatory Visit: Payer: PPO | Admitting: Diagnostic Neuroimaging

## 2020-03-21 VITALS — BP 152/90 | HR 72 | Ht 62.0 in | Wt 221.0 lb

## 2020-03-21 DIAGNOSIS — G609 Hereditary and idiopathic neuropathy, unspecified: Secondary | ICD-10-CM

## 2020-03-21 DIAGNOSIS — G40909 Epilepsy, unspecified, not intractable, without status epilepticus: Secondary | ICD-10-CM

## 2020-03-21 DIAGNOSIS — G4733 Obstructive sleep apnea (adult) (pediatric): Secondary | ICD-10-CM | POA: Diagnosis not present

## 2020-03-21 NOTE — Patient Instructions (Addendum)
SEIZURE DISORDER (since 1982; last seizure 1992) - continue dilantin (200mg  twice a day)  NEUROPATHY (A1c 6.7; diabetic vs idiopathic / hereditary; prior dx of small fiber neuropathy) - monitor sugars; continue pain mgmt - consider duloxetine 30-60mg  daily, amitriptyline 25-50mg  at bedtime; hold off as currently on sertraline - consider capsaicin cream, lidocaine patch / cream, alpha-lipoic acid 600mg  daily  OSA (last study in 1990's; needs new equipment and supplies) - needs new provider; will order sleep consult at patient's request

## 2020-03-21 NOTE — Progress Notes (Signed)
[facility]  PATIENT: Deborah Carroll DOB: 06/17/1955  REFERRING CLINICIAN: Kathyrn Drown, MD HISTORY FROM: patient  REASON FOR VISIT: new consult    HISTORICAL  CHIEF COMPLAINT:  Chief Complaint  Patient presents with  . Seizures  . Peripheral Neuropathy    Rm 7 New Pt     HISTORY OF PRESENT ILLNESS:   64 year old female here for evaluation of neuropathy and seizures.  Patient had first convulsive seizure in 1982 (3 to 6 months after her pregnancy).  She was started on Dilantin.  She had some intermittent seizures when she ran out of medication or missed doses.  Her last seizure was in 1992.  She has continued on Dilantin since that time.  Patient developed hammertoes in 1995.  She underwent some surgeries for it.  Following this she had some burning and numbness in her toes.  She had neurology evaluation.  Apparently EMG was normal, but possibility of small fiber neuropathy was raised.  She was tried on gabapentin and Lyrica without relief.  Patient also has had low back pain issues for a long time and underwent back surgery 2007.  She has been under pain management over the past few years with hydrocodone.   REVIEW OF SYSTEMS: Full 14 system review of systems performed and negative with exception of: As per HPI.  ALLERGIES: No Known Allergies  HOME MEDICATIONS: Outpatient Medications Prior to Visit  Medication Sig Dispense Refill  . acetaminophen (TYLENOL) 500 MG tablet Take 1,000 mg by mouth every 6 (six) hours as needed.    . APPLE CIDER VINEGAR PO Take 1 tablet by mouth daily.    . Cinnamon 500 MG capsule Take 500 mg by mouth daily.    Marland Kitchen dicyclomine (BENTYL) 10 MG capsule Take one capsule every morning to prevent diarrhea. May take up to three times a day for abdominal cramping and loose stools. 90 capsule 2  . hydrochlorothiazide (HYDRODIURIL) 25 MG tablet TAKE 1 TABLET(25 MG) BY MOUTH DAILY 90 tablet 1  . HYDROcodone-acetaminophen (NORCO)  10-325 MG tablet Take one tablet po TID prn pain 90 tablet 0  . metFORMIN (GLUCOPHAGE) 500 MG tablet Take 1 tablet daily. 30 tablet 5  . pantoprazole (PROTONIX) 40 MG tablet TAKE 1 TABLET(40 MG) BY MOUTH DAILY 90 tablet 1  . phenytoin (DILANTIN) 100 MG ER capsule TAKE 2 CAPSULES BY MOUTH EVERY MORNING AND 2 CAPSULE BY MOUTH EVERY EVENING 360 capsule 1  . potassium chloride (KLOR-CON) 10 MEQ tablet Take one tablet po BID 180 tablet 1  . rosuvastatin (CRESTOR) 5 MG tablet TAKE 1 TABLET(5 MG) BY MOUTH DAILY 90 tablet 1  . sertraline (ZOLOFT) 100 MG tablet TAKE 1 TABLET(100 MG) BY MOUTH DAILY 90 tablet 1  . torsemide (DEMADEX) 20 MG tablet Take 1 tablet (20 mg total) by mouth daily as needed (fluid or swelling). 30 tablet 2  . HYDROcodone-acetaminophen (NORCO) 10-325 MG tablet Take one tablet po TID prn pain 90 tablet 0  . HYDROcodone-acetaminophen (NORCO) 10-325 MG tablet Take one tablet po bid prn pain (Patient not taking: No sig reported) 10 tablet 0   No facility-administered medications prior to visit.    PAST MEDICAL HISTORY: Past Medical History:  Diagnosis Date  . Anxiety   . Chronic pain   . Depression   . Hypertension   . Morbid obesity ([place]) 03/20/2018  . Neuropathy   . Peripheral neuropathy   . Peripheral neuropathy   . Seizures ([place])    last one  1992- Increased BP with delivery and is on meds still.  . Sleep apnea    CPAP x 15 years    PAST SURGICAL HISTORY: Past Surgical History:  Procedure Laterality Date  . ABDOMINAL HYSTERECTOMY     ovaries present, no cancer,approx 2001  . BACK SURGERY    . CARPAL TUNNEL RELEASE     Bilateral  . CATARACT EXTRACTION W/PHACO Right 06/20/2015   Procedure: CATARACT EXTRACTION PHACO AND INTRAOCULAR LENS PLACEMENT (IOC);  Surgeon: Rutherford Guys, MD;  Location: [facility];  Service: Ophthalmology;  Laterality: Right;  CDE:6.25  . CATARACT EXTRACTION W/PHACO Left 08/29/2015   Procedure: CATARACT EXTRACTION PHACO AND INTRAOCULAR LENS PLACEMENT  (IOC);  Surgeon: Rutherford Guys, MD;  Location: [facility];  Service: Ophthalmology;  Laterality: Left;  CDE: 4.11  . CESAREAN SECTION     x2  . CHOLECYSTECTOMY    . COLONOSCOPY  10/14/2002   GQQ:[id number] involving descending colon, splenic flexure and distal transverse colon with most extensive inflammation in the splenic flexure area.  These changes are typical of ischemic colitis.  Biopsy taken.  . COLONOSCOPY N/A 08/04/2012   SLF: POLYPOID LESION IN AT THE APPENDICEAL ORIFICE/Polyp  in the descending colon/Moderate melanosis throughout the entire examined colon/ The colon IS redundant. small tubular adenoma.   . COLONOSCOPY WITH PROPOFOL N/A 11/17/2018   3 tubular adenomas removed, 3 hyperplastic polyps, next colonoscopy in 11/2021.   Marland Kitchen FOOT SURGERY Bilateral 2004   hammer toe repairs  . PARS PLANA VITRECTOMY W/ REPAIR OF MACULAR HOLE    . POLYPECTOMY  11/17/2018   Procedure: POLYPECTOMY;  Surgeon: Danie Binder, MD;  Location: [facility];  Service: Endoscopy;;    FAMILY HISTORY: Family History  Problem Relation Age of Onset  . Colon polyps Mother 43       benign   . Hypertension Maternal Grandmother   . Heart attack Maternal Grandfather   . Emphysema Father   . Asthma Father   . Colon cancer Neg Hx   . Liver disease Neg Hx     SOCIAL HISTORY: Social History   Socioeconomic History  . Marital status: Married    Spouse name: Not on file  . Number of children: 2  . Years of education: 21  . Highest education level: Not on file  Occupational History  . Occupation: disability  Tobacco Use  . Smoking status: Never Smoker  . Smokeless tobacco: Never Used  . Tobacco comment: smoked x 4 years many years ago  Substance and Sexual Activity  . Alcohol use: Not Currently  . Drug use: No  . Sexual activity: Yes    Birth control/protection: Surgical  Other Topics Concern  . Not on file  Social History Narrative   lives with husband   Social Determinants of Health    Financial Resource Strain: Not on file  Food Insecurity: Not on file  Transportation Needs: Not on file  Physical Activity: Not on file  Stress: Not on file  Social Connections: Not on file  Intimate Partner Violence: Not on file     PHYSICAL EXAM  GENERAL EXAM/CONSTITUTIONAL: Vitals:  Vitals:   03/21/20 1237  BP: (!) 152/90  Pulse: 72  Weight: 221 lb (100.2 kg)  Height: 5\' 2"  (1.575 m)     Body mass index is 40.42 kg/m. Wt Readings from Last 3 Encounters:  03/21/20 221 lb (100.2 kg)  03/20/20 220 lb (99.8 kg)  03/20/20 221 lb 6.4 oz (100.4 kg)     Patient  is in no distress; well developed, nourished and groomed; neck is supple  CARDIOVASCULAR:  Examination of carotid arteries is normal; no carotid bruits  Regular rate and rhythm, no murmurs  Examination of peripheral vascular system by observation and palpation is normal  EYES:  Ophthalmoscopic exam of optic discs and posterior segments is normal; no papilledema or hemorrhages  No exam data present  MUSCULOSKELETAL:  Gait, strength, tone, movements noted in Neurologic exam below  NEUROLOGIC: MENTAL STATUS:  No flowsheet data found.  awake, alert, oriented to person, place and time  recent and remote memory intact  normal attention and concentration  language fluent, comprehension intact, naming intact  fund of knowledge appropriate  CRANIAL NERVE:   2nd - no papilledema on fundoscopic exam  2nd, 3rd, 4th, 6th - pupils equal and reactive to light, visual fields full to confrontation, extraocular muscles intact, no nystagmus  5th - facial sensation symmetric  7th - facial strength symmetric  8th - hearing intact  9th - palate elevates symmetrically, uvula midline  11th - shoulder shrug symmetric  12th - tongue protrusion midline  MOTOR:   normal bulk and tone, full strength in the BUE, BLE  SENSORY:   normal and symmetric to light touch, temperature, vibration; EXCEPT  SLIGHTLY DECR IN FEET  COORDINATION:   finger-nose-finger, fine finger movements normal  REFLEXES:   deep tendon reflexes TRACE and symmetric  GAIT/STATION:   narrow based gait     DIAGNOSTIC DATA (LABS, IMAGING, TESTING) - I reviewed patient records, labs, notes, testing and imaging myself where available.  Lab Results  Component Value Date   WBC 14.7 (H) 03/20/2020   HGB 14.4 03/20/2020   HCT 43.4 03/20/2020   MCV 88.9 03/20/2020   PLT 229 03/20/2020      Component Value Date/Time   NA 139 03/20/2020 1815   NA 138 02/01/2020 0852   K 3.1 (L) 03/20/2020 1815   CL 102 03/20/2020 1815   CO2 27 03/20/2020 1815   GLUCOSE 113 (H) 03/20/2020 1815   BUN 10 03/20/2020 1815   BUN 11 02/01/2020 0852   CREATININE 0.58 03/20/2020 1815   CREATININE 0.66 05/16/2014 1023   CALCIUM 9.5 03/20/2020 1815   PROT 6.6 09/27/2019 0843   ALBUMIN 4.2 09/27/2019 0843   AST 22 09/27/2019 0843   ALT 20 09/27/2019 0843   ALKPHOS 140 (H) 09/27/2019 0843   BILITOT 0.4 09/27/2019 0843   GFRNONAA >60 03/20/2020 1815   GFRAA 92 02/01/2020 0852   Lab Results  Component Value Date   CHOL 144 02/01/2020   HDL 52 02/01/2020   LDLCALC 68 02/01/2020   TRIG 140 02/01/2020   CHOLHDL 2.8 02/01/2020   Lab Results  Component Value Date   HGBA1C 6.7 (H) 02/01/2020   Lab Results  Component Value Date   VWPVXYIA16 553 08/19/2013   Lab Results  Component Value Date   TSH 4.230 02/01/2020     04/16/07 MRI lumbar spine 1. No acute abnormality. Solid fusion at L4-5 with no residual impingement. Minimal scarring around the thecal sac and nerve roots at L4-5 to the expected degree.  2. Otherwise, essentially normal MRI of the lumbar spine.   03/08/10 MRI brain - Negative MRI of the brain.     ASSESSMENT AND PLAN  64 y.o. year old female here with:  Meds tried (gabapentin, lyrica)   Dx:  1. Idiopathic peripheral neuropathy   2. OSA (obstructive sleep apnea)   3. Seizure disorder  ([place])  PLAN:  SEIZURE DISORDER (since 1982; last seizure 1992) - doing well; continue dilantin (200mg  twice a day)  NEUROPATHY (A1c 6.7; diabetic vs idiopathic / hereditary; prior dx of idiopathic small fiber neuropathy) - monitor sugars; continue pain mgmt - consider duloxetine 30-60mg  daily, amitriptyline 25-50mg  at bedtime; hold off as currently on sertraline - consider capsaicin cream, lidocaine patch / cream, alpha-lipoic acid 600mg  daily  OSA (last study in 1990's; needs new equipment and supplies) - needs new provider; will order sleep consult at patient's request  Orders Placed This Encounter  Procedures  . Ambulatory referral to Sleep Studies   Return for pending if symptoms worsen or fail to improve.    Penni Bombard, MD 29/19/1660, 6:00 PM Certified in Neurology, Neurophysiology and Neuroimaging  [facility] [address], [place], [place] [address] [phone]

## 2020-03-28 ENCOUNTER — Encounter: Payer: Self-pay | Admitting: Family Medicine

## 2020-03-30 NOTE — Patient Instructions (Signed)
Deborah Carroll  03/30/2020     @PREFPERIOPPHARMACY @   Your procedure is scheduled on  04/11/2020.  Report to [facility] at  1000  A.M.  Call this number if you have problems the morning of surgery:  [phone]   Remember:  Follow the diet and prep instructions given to you by the office.                          Take these medicines the morning of surgery with A SIP OF WATER  Hydrocodone(if needed), protonix, dilantin, zoloft.    Do not wear jewelry, make-up or nail polish.  Do not wear lotions, powders, or perfumes. Please wear deodorant and brush your teeth.  Do not shave 48 hours prior to surgery.  Men may shave face and neck.  Do not bring valuables to the hospital.  [facility] is not responsible for any belongings or valuables.  Contacts, dentures or bridgework may not be worn into surgery.  Leave your suitcase in the car.  After surgery it may be brought to your room.  For patients admitted to the hospital, discharge time will be determined by your treatment team.  Patients discharged the day of surgery will not be allowed to drive home.   Name and phone number of your driver:   family Special instructions:  DO NOT smoke the day of your procedure.  Please read over the following fact sheets that you were given. Anesthesia Post-op Instructions and Care and Recovery After Surgery       Upper Endoscopy, Adult, Care After This sheet gives you information about how to care for yourself after your procedure. Your health care provider may also give you more specific instructions. If you have problems or questions, contact your health care provider. What can I expect after the procedure? After the procedure, it is common to have:  A sore throat.  Mild stomach pain or discomfort.  Bloating.  Nausea. Follow these instructions at home:   Follow instructions from your health care provider about what to eat or drink after your procedure.  Return to your  normal activities as told by your health care provider. Ask your health care provider what activities are safe for you.  Take over-the-counter and prescription medicines only as told by your health care provider.  Do not drive for 24 hours if you were given a sedative during your procedure.  Keep all follow-up visits as told by your health care provider. This is important. Contact a health care provider if you have:  A sore throat that lasts longer than one day.  Trouble swallowing. Get help right away if:  You vomit blood or your vomit looks like coffee grounds.  You have: ? A fever. ? Bloody, black, or tarry stools. ? A severe sore throat or you cannot swallow. ? Difficulty breathing. ? Severe pain in your chest or abdomen. Summary  After the procedure, it is common to have a sore throat, mild stomach discomfort, bloating, and nausea.  Do not drive for 24 hours if you were given a sedative during the procedure.  Follow instructions from your health care provider about what to eat or drink after your procedure.  Return to your normal activities as told by your health care provider. This information is not intended to replace advice given to you by your health care provider. Make sure you discuss any questions you have with your  health care provider. Document Revised: 09/16/2017 Document Reviewed: 08/25/2017 Elsevier Patient Education  2020 [place] After These instructions provide you with information about caring for yourself after your procedure. Your health care provider may also give you more specific instructions. Your treatment has been planned according to current medical practices, but problems sometimes occur. Call your health care provider if you have any problems or questions after your procedure. What can I expect after the procedure? After your procedure, you may:  Feel sleepy for several hours.  Feel clumsy and have poor  balance for several hours.  Feel forgetful about what happened after the procedure.  Have poor judgment for several hours.  Feel nauseous or vomit.  Have a sore throat if you had a breathing tube during the procedure. Follow these instructions at home: For at least 24 hours after the procedure:      Have a responsible adult stay with you. It is important to have someone help care for you until you are awake and alert.  Rest as needed.  Do not: ? Participate in activities in which you could fall or become injured. ? Drive. ? Use heavy machinery. ? Drink alcohol. ? Take sleeping pills or medicines that cause drowsiness. ? Make important decisions or sign legal documents. ? Take care of children on your own. Eating and drinking  Follow the diet that is recommended by your health care provider.  If you vomit, drink water, juice, or soup when you can drink without vomiting.  Make sure you have little or no nausea before eating solid foods. General instructions  Take over-the-counter and prescription medicines only as told by your health care provider.  If you have sleep apnea, surgery and certain medicines can increase your risk for breathing problems. Follow instructions from your health care provider about wearing your sleep device: ? Anytime you are sleeping, including during daytime naps. ? While taking prescription pain medicines, sleeping medicines, or medicines that make you drowsy.  If you smoke, do not smoke without supervision.  Keep all follow-up visits as told by your health care provider. This is important. Contact a health care provider if:  You keep feeling nauseous or you keep vomiting.  You feel light-headed.  You develop a rash.  You have a fever. Get help right away if:  You have trouble breathing. Summary  For several hours after your procedure, you may feel sleepy and have poor judgment.  Have a responsible adult stay with you for at least  24 hours or until you are awake and alert. This information is not intended to replace advice given to you by your health care provider. Make sure you discuss any questions you have with your health care provider. Document Revised: 06/23/2017 Document Reviewed: 07/16/2015 Elsevier Patient Education  [place].

## 2020-04-04 ENCOUNTER — Encounter: Payer: Self-pay | Admitting: *Deleted

## 2020-04-04 ENCOUNTER — Telehealth: Payer: Self-pay | Admitting: Internal Medicine

## 2020-04-04 NOTE — Telephone Encounter (Signed)
Called pt. She has been rescheduled to 2/15 at 8:15am. Patient aware will mail new instructions with new pre-op/covid test appt. Called endo and LMOVM making aware of appt change.

## 2020-04-04 NOTE — Telephone Encounter (Signed)
PATIENT SICK, NEEDS TO RESCHEDULE PROCEDURE

## 2020-04-05 ENCOUNTER — Encounter (HOSPITAL_COMMUNITY)
Admission: RE | Admit: 2020-04-05 | Discharge: 2020-04-05 | Disposition: A | Discharge status: Home / Self Care | Payer: PPO | Source: Ambulatory Visit | Attending: Internal Medicine | Admitting: Internal Medicine

## 2020-04-10 ENCOUNTER — Other Ambulatory Visit (HOSPITAL_COMMUNITY): Payer: PPO

## 2020-04-14 ENCOUNTER — Ambulatory Visit: Payer: PPO | Admitting: Interventional Cardiology

## 2020-04-14 ENCOUNTER — Other Ambulatory Visit: Payer: Self-pay

## 2020-04-14 ENCOUNTER — Encounter: Payer: Self-pay | Admitting: Interventional Cardiology

## 2020-04-14 DIAGNOSIS — R072 Precordial pain: Secondary | ICD-10-CM

## 2020-04-14 DIAGNOSIS — E782 Mixed hyperlipidemia: Secondary | ICD-10-CM

## 2020-04-14 DIAGNOSIS — R7303 Prediabetes: Secondary | ICD-10-CM | POA: Diagnosis not present

## 2020-04-14 MED ORDER — METOPROLOL TARTRATE 50 MG PO TABS: ORAL_TABLET | ORAL | 0 refills | Status: DC

## 2020-04-14 NOTE — Progress Notes (Signed)
Cardiology Office Note   Date:  04/14/2020   ID:  [name], [name] Mar 23, 1956, MRN [id number]  PCP:  Kathyrn Drown, MD    No chief complaint on file.  Atypical chest pain  Wt Readings from Last 3 Encounters:  04/14/20 221 lb (100.2 kg)  03/21/20 221 lb (100.2 kg)  03/20/20 220 lb (99.8 kg)       History of Present Illness: Deborah Carroll is a 65 y.o. female who is being seen today for the evaluation of chest pain at the request of Couture, Cortni S, PA-C.  In mid December, she had chest pressure with records showing the following: "Wednesday night she helped her husband moving something heavy in the house.  By Thursday night she was complaining of midsternal chest pain that radiated to the back, shortness of breath felt cold sweaty and clammy some nausea had monitored her blood pressure had blood pressures in the 180s over 100s and generally 130s to 140s over 70 to 80s pain on Thursday was 8/10 by Saturday it was 7-8/10 by Monday 3-4/10 today in the office 3/10 also complains of some belching.  Reports shortness of breath with taking a deep breath.  EKG done in the office, no obvious ischemia noted."  Went to ER.  Negative troponins and ECG.  Discharged with cardiology f/u.   Since the last visit, she has had some minimal chest pain in the center of her chest, and going to her back.  Worse with emotional stress.    Grandmother with CHF.   She has felt some skipped beats as well.   She had a stress test many years. No heart cath in the past.   She walks regularly.  She has some DOE.  No discomfort in the chest with that walking.   Past Medical History:  Diagnosis Date  . Anxiety   . Chronic pain   . Depression   . Hypertension   . Morbid obesity ([place]) 03/20/2018  . Neuropathy   . Peripheral neuropathy   . Peripheral neuropathy   . Seizures ([place])    last one 1992- Increased BP with delivery and is on meds still.  . Sleep apnea    CPAP x 15 years    Past  Surgical History:  Procedure Laterality Date  . ABDOMINAL HYSTERECTOMY     ovaries present, no cancer,approx 2001  . BACK SURGERY    . CARPAL TUNNEL RELEASE     Bilateral  . CATARACT EXTRACTION W/PHACO Right 06/20/2015   Procedure: CATARACT EXTRACTION PHACO AND INTRAOCULAR LENS PLACEMENT (IOC);  Surgeon: Rutherford Guys, MD;  Location: [facility];  Service: Ophthalmology;  Laterality: Right;  CDE:6.25  . CATARACT EXTRACTION W/PHACO Left 08/29/2015   Procedure: CATARACT EXTRACTION PHACO AND INTRAOCULAR LENS PLACEMENT (IOC);  Surgeon: Rutherford Guys, MD;  Location: [facility];  Service: Ophthalmology;  Laterality: Left;  CDE: 4.11  . CESAREAN SECTION     x2  . CHOLECYSTECTOMY    . COLONOSCOPY  10/14/2002   ACZ:[id number] involving descending colon, splenic flexure and distal transverse colon with most extensive inflammation in the splenic flexure area.  These changes are typical of ischemic colitis.  Biopsy taken.  . COLONOSCOPY N/A 08/04/2012   SLF: POLYPOID LESION IN AT THE APPENDICEAL ORIFICE/Polyp  in the descending colon/Moderate melanosis throughout the entire examined colon/ The colon IS redundant. small tubular adenoma.   . COLONOSCOPY WITH PROPOFOL N/A 11/17/2018   3 tubular adenomas removed, 3 hyperplastic polyps, next colonoscopy  in 11/2021.   Marland Kitchen FOOT SURGERY Bilateral 2004   hammer toe repairs  . PARS PLANA VITRECTOMY W/ REPAIR OF MACULAR HOLE    . POLYPECTOMY  11/17/2018   Procedure: POLYPECTOMY;  Surgeon: Danie Binder, MD;  Location: [facility];  Service: Endoscopy;;     Current Outpatient Medications  Medication Sig Dispense Refill  . acetaminophen (TYLENOL) 500 MG tablet Take 1,000 mg by mouth every 6 (six) hours as needed for moderate pain.    . APPLE CIDER VINEGAR PO Take 1 tablet by mouth daily.    Marland Kitchen ascorbic acid (VITAMIN C) 500 MG tablet Take 500 mg by mouth daily.    . cholecalciferol (VITAMIN D3) 25 MCG (1000 UNIT) tablet Take 1,000 Units by mouth daily.    . Cinnamon 500 MG  capsule Take 500 mg by mouth in the morning and at bedtime.    . dicyclomine (BENTYL) 10 MG capsule Take one capsule every morning to prevent diarrhea. May take up to three times a day for abdominal cramping and loose stools. (Patient taking differently: Take 10 mg by mouth See admin instructions. Take one capsule every morning to prevent diarrhea. May take up to three times a day for abdominal cramping and loose stools.) 90 capsule 2  . hydrochlorothiazide (HYDRODIURIL) 25 MG tablet TAKE 1 TABLET(25 MG) BY MOUTH DAILY (Patient taking differently: Take 25 mg by mouth daily. TAKE 1 TABLET(25 MG) BY MOUTH DAILY) 90 tablet 1  . HYDROcodone-acetaminophen (NORCO) 10-325 MG tablet Take one tablet po TID prn pain (Patient taking differently: Take 1 tablet by mouth 3 (three) times daily as needed for severe pain. Take one tablet po TID prn pain) 90 tablet 0  . metFORMIN (GLUCOPHAGE) 500 MG tablet Take 1 tablet daily. (Patient taking differently: Take 500 mg by mouth every evening. Take 1 tablet daily.) 30 tablet 5  . pantoprazole (PROTONIX) 40 MG tablet TAKE 1 TABLET(40 MG) BY MOUTH DAILY (Patient taking differently: Take 40 mg by mouth daily. TAKE 1 TABLET(40 MG) BY MOUTH DAILY) 90 tablet 1  . phenytoin (DILANTIN) 100 MG ER capsule TAKE 2 CAPSULES BY MOUTH EVERY MORNING AND 2 CAPSULE BY MOUTH EVERY EVENING (Patient taking differently: Take 200 mg by mouth 2 (two) times daily. TAKE 2 CAPSULES BY MOUTH EVERY MORNING AND 2 CAPSULE BY MOUTH EVERY EVENING) 360 capsule 1  . potassium chloride SA (KLOR-CON) 20 MEQ tablet Take 20 mEq by mouth 2 (two) times daily.    . rosuvastatin (CRESTOR) 5 MG tablet TAKE 1 TABLET(5 MG) BY MOUTH DAILY (Patient taking differently: Take 5 mg by mouth daily. TAKE 1 TABLET(5 MG) BY MOUTH DAILY) 90 tablet 1  . sertraline (ZOLOFT) 100 MG tablet TAKE 1 TABLET(100 MG) BY MOUTH DAILY (Patient taking differently: Take 100 mg by mouth daily. TAKE 1 TABLET(100 MG) BY MOUTH DAILY) 90 tablet 1  .  torsemide (DEMADEX) 20 MG tablet Take 1 tablet (20 mg total) by mouth daily as needed (fluid or swelling). 30 tablet 2   No current facility-administered medications for this visit.    Allergies:   Patient has no known allergies.    Social History:  The patient  reports that she has never smoked. She has never used smokeless tobacco. She reports previous alcohol use. She reports that she does not use drugs.   Family History:  The patient's family history includes Asthma in her father; Colon polyps (age of onset: 10) in her mother; Emphysema in her father; Heart attack in her  maternal grandfather; Hypertension in her maternal grandmother.    ROS:  Please see the history of present illness.   Otherwise, review of systems are positive for chest pain/back pain.   All other systems are reviewed and negative.    PHYSICAL EXAM: VS:  BP 128/82   Pulse 70   Ht 5\' 2"  (1.575 m)   Wt 221 lb (100.2 kg)   SpO2 98%   BMI 40.42 kg/m  , BMI Body mass index is 40.42 kg/m. GEN: Well nourished, well developed, in no acute distress  HEENT: normal  Neck: no JVD, carotid bruits, or masses Cardiac: RRR; no murmurs, rubs, or gallops,no edema  Respiratory:  clear to auscultation bilaterally, normal work of breathing GI: soft, nontender, nondistended, + BS MS: no deformity or atrophy  Skin: warm and dry, no rash Neuro:  Strength and sensation are intact Psych: euthymic mood, full affect   EKG:   The ekg ordered 12/14 demonstrates NSR, ant ST-T wave changes   Recent Labs: 09/27/2019: ALT 20 02/01/2020: TSH 4.230 03/20/2020: BUN 10; Creatinine, Ser 0.58; Hemoglobin 14.4; Platelets 229; Potassium 3.1; Sodium 139   Lipid Panel    Component Value Date/Time   CHOL 144 02/01/2020 0852   TRIG 140 02/01/2020 0852   HDL 52 02/01/2020 0852   CHOLHDL 2.8 02/01/2020 0852   CHOLHDL 4.0 05/16/2014 1023   VLDL 33 05/16/2014 1023   LDLCALC 68 02/01/2020 0852     Other studies Reviewed: Additional  studies/ records that were reviewed today with results demonstrating: ER results reviewed.   ASSESSMENT AND PLAN:  1. Chest pain: Negative troponins.  Several atypical features.  Pain in the back is worse at times.  Given the risk factors she has, Plan for coronary CTA.  Metoprolol 50 mg x1 before the test.   2. PreDM: A1C 6.7.  Continue metformin.  Whole food, plant based diet.  3. Morbid obesity: We spoke about dietary recs and she does walk regularly.  She is also drinking a lot of water.  4. Hyperlipidemia: LDL 68. Continue Crestor.   Current medicines are reviewed at length with the patient today.  The patient concerns regarding her medicines were addressed.  The following changes have been made:  No change  Labs/ tests ordered today include:  No orders of the defined types were placed in this encounter.   Recommend 150 minutes/week of aerobic exercise Low fat, low carb, high fiber diet recommended  Disposition:   FU for CT scan   Signed, Larae Grooms, MD  04/14/2020 2:42 PM    [facility] [place], [place], [place]  [address] Phone: [phone]; Fax: [phone]

## 2020-04-14 NOTE — Patient Instructions (Signed)
Medication Instructions:  Your physician recommends that you continue on your current medications as directed. Please refer to the Current Medication list given to you today.  *If you need a refill on your cardiac medications before your next appointment, please call your pharmacy*  Lab Work: Your physician recommends that you return for lab work before your CT test for BMET  If you have labs (blood work) drawn today and your tests are completely normal, you will receive your results only by: Marland Kitchen MyChart Message (if you have MyChart) OR . A paper copy in the mail If you have any lab test that is abnormal or we need to change your treatment, we will call you to review the results.   Testing/Procedures: Your physician has requested that you have cardiac CT. Cardiac computed tomography (CT) is a painless test that uses an x-ray machine to take clear, detailed pictures of your heart. For further information please visit [email or web address].[email or web address]. Please follow instruction sheet as given.  Follow-Up: At [facility], you and your health needs are our priority.  As part of our continuing mission to provide you with exceptional heart care, we have created designated Provider Care Teams.  These Care Teams include your primary Cardiologist (physician) and Advanced Practice Providers (APPs -  Physician Assistants and Nurse Practitioners) who all work together to provide you with the care you need, when you need it.  We recommend signing up for the patient portal called "MyChart".  Sign up information is provided on this After Visit Summary.  MyChart is used to connect with patients for Virtual Visits (Telemedicine).  Patients are able to view lab/test results, encounter notes, upcoming appointments, etc.  Non-urgent messages can be sent to your provider as well.   To learn more about what you can do with MyChart, go to [email or web address].    Your next appointment:   Pending on results of your test    The format for your next appointment:   In Person  Provider:   You may see Larae Grooms, MD or one of the following Advanced Practice Providers on your designated Care Team:    Melina Copa, PA-C  Ermalinda Barrios, PA-C    Other Instructions  Your cardiac CT will be scheduled at one of the below locations:   [facility] [address] [place], [place] [address] [phone]  If scheduled at [facility], please arrive at the [facility] main entrance of [facility] 30 minutes prior to test start time. Proceed to the [facility] Radiology Department (first floor) to check-in and test prep.  Please follow these instructions carefully (unless otherwise directed):   On the Night Before the Test: . Be sure to Drink plenty of water. . Do not consume any caffeinated/decaffeinated beverages or chocolate 12 hours prior to your test. . Do not take any antihistamines 12 hours prior to your test.  On the Day of the Test: . Drink plenty of water. Do not drink any water within one hour of the test. . Do not eat any food 4 hours prior to the test. . You may take your regular medications prior to the test.  . Take metoprolol (Lopressor) 50 mg two hours prior to test. . HOLD Torsemide and Hydrochlorothiazide morning of the test. . FEMALES- please wear underwire-free bra if available      After the Test: . Drink plenty of water. . After receiving IV contrast, you may experience a mild flushed feeling. This is normal. .  On occasion, you may experience a mild rash up to 24 hours after the test. This is not dangerous. If this occurs, you can take Benadryl 25 mg and increase your fluid intake. . If you experience trouble breathing, this can be serious. If it is severe call 911 IMMEDIATELY. If it is mild, please call our office. . If you take any of these medications: Metformin please do not take 48 hours after completing test unless otherwise  instructed.   Once we have confirmed authorization from your insurance company, we will call you to set up a date and time for your test. Based on how quickly your insurance processes prior authorizations requests, please allow up to 4 weeks to be contacted for scheduling your Cardiac CT appointment. Be advised that routine Cardiac CT appointments could be scheduled as many as 8 weeks after your provider has ordered it.  For non-scheduling related questions, please contact the cardiac imaging nurse navigator should you have any questions/concerns: Marchia Bond, Cardiac Imaging Nurse Navigator Burley Saver, Interim Cardiac Imaging Nurse [place] [facility] Direct Office Dial: [phone]   For scheduling needs, including cancellations and rescheduling, please call [place], [phone].

## 2020-04-16 ENCOUNTER — Encounter: Payer: Self-pay | Admitting: Neurology

## 2020-04-19 ENCOUNTER — Other Ambulatory Visit: Payer: Self-pay | Admitting: Interventional Cardiology

## 2020-04-24 ENCOUNTER — Institutional Professional Consult (permissible substitution): Payer: PPO | Admitting: Neurology

## 2020-04-27 ENCOUNTER — Telehealth (INDEPENDENT_AMBULATORY_CARE_PROVIDER_SITE_OTHER): Payer: PPO | Admitting: Family Medicine

## 2020-04-27 DIAGNOSIS — E119 Type 2 diabetes mellitus without complications: Secondary | ICD-10-CM | POA: Diagnosis not present

## 2020-04-27 DIAGNOSIS — R569 Unspecified convulsions: Secondary | ICD-10-CM

## 2020-04-27 DIAGNOSIS — K582 Mixed irritable bowel syndrome: Secondary | ICD-10-CM | POA: Diagnosis not present

## 2020-04-27 DIAGNOSIS — R6881 Early satiety: Secondary | ICD-10-CM | POA: Diagnosis not present

## 2020-04-27 DIAGNOSIS — R630 Anorexia: Secondary | ICD-10-CM | POA: Diagnosis not present

## 2020-04-27 DIAGNOSIS — E785 Hyperlipidemia, unspecified: Secondary | ICD-10-CM

## 2020-04-27 DIAGNOSIS — E1169 Type 2 diabetes mellitus with other specified complication: Secondary | ICD-10-CM

## 2020-04-27 DIAGNOSIS — Z79891 Long term (current) use of opiate analgesic: Secondary | ICD-10-CM | POA: Diagnosis not present

## 2020-04-27 DIAGNOSIS — R748 Abnormal levels of other serum enzymes: Secondary | ICD-10-CM | POA: Diagnosis not present

## 2020-04-27 MED ORDER — ALPRAZOLAM 0.5 MG PO TABS: ORAL_TABLET | ORAL | 0 refills | Status: DC

## 2020-04-27 MED ORDER — SERTRALINE HCL 100 MG PO TABS: ORAL_TABLET | ORAL | 1 refills | Status: DC

## 2020-04-27 MED ORDER — HYDROCODONE-ACETAMINOPHEN 10-325 MG PO TABS: ORAL_TABLET | ORAL | 0 refills | Status: DC

## 2020-04-27 NOTE — Progress Notes (Unsigned)
Subjective:    Patient ID: Deborah Carroll, female    DOB: February 24, 1956, 65 y.o.   MRN: [id number]  HPI  Virtual Visit via Telephone Note  I connected with Deborah Carroll on 04/27/20 at 11:00 AM EST by telephone and verified that I am speaking with the correct person using two identifiers.  Location: Patient: home Provider: home   I discussed the limitations, risks, security and privacy concerns of performing an evaluation and management service by telephone and the availability of in person appointments. I also discussed with the patient that there may be a patient responsible charge related to this service. The patient expressed understanding and agreed to proceed.   History of Present Illness:    Observations/Objective:   Assessment and Plan:   Follow Up Instructions:    I discussed the assessment and treatment plan with the patient. The patient was provided an opportunity to ask questions and all were answered. The patient agreed with the plan and demonstrated an understanding of the instructions.   The patient was advised to call back or seek an in-person evaluation if the symptoms worsen or if the condition fails to improve as anticipated.  I provided 20 minutes of non-face-to-face time during this encounter.  This patient was seen today for chronic pain  The medication list was reviewed and updated.  SEE PHq9- Patient reports not feeling suicidal today and is not a risk to herself.  [place] from 04/27/2020 in [place]  PHQ-9 Total Score 19     [place] from 04/27/2020 in [place] Visit from 02/01/2020 in [place] from 09/10/2019 in [place]  Thoughts that you would be better off dead, or of hurting yourself in some way Several days Not at all Not at all  PHQ-9 Total Score 19 11 11        -Compliance with medication: yes  - Number patient  states they take daily: 2-2.5  -when was the last dose patient took? Last night   The patient was advised the importance of maintaining medication and not using illegal substances with these.  Here for refills and follow up  The patient was educated that we can provide 3 monthly scripts for their medication, it is their responsibility to follow the instructions.  Side effects or complications from medications: none  Patient is aware that pain medications are meant to minimize the severity of the pain to allow their pain levels to improve to allow for better function. They are aware of that pain medications cannot totally remove their pain.  Due for UDT ( at least once per year) : 01/31/21  Scale of 1 to 10 ( 1 is least 10 is most) Your pain level without the medicine: 7 Your pain level with medication 3  Scale 1 to 10 ( 1-helps very little, 10 helps very well) How well does your pain medication reduce your pain so you can function better through out the day? 7      Review of Systems  Constitutional: Negative for activity change and appetite change.  HENT: Negative for congestion and rhinorrhea.   Respiratory: Negative for cough and shortness of breath.   Cardiovascular: Negative for chest pain and leg swelling.  Gastrointestinal: Negative for abdominal pain, nausea and vomiting.  Musculoskeletal: Positive for back pain. Negative for arthralgias.  Skin: Negative for color change.  Neurological: Negative for dizziness and weakness.  Psychiatric/Behavioral: Negative for agitation and confusion.  Objective:   Physical Exam Today's visit was via telephone Physical exam was not possible for this visit        Assessment & Plan:  The patient was seen in followup for chronic pain. A review over at their current pain status was discussed. Drug registry was checked. Prescriptions were given.  Regular follow-up recommended. Discussion was held regarding the importance of  compliance with medication as well as pain medication contract.  Patient was informed that medication may cause drowsiness and should not be combined  with other medications/alcohol or street drugs. If the patient feels medication is causing altered alertness then do not drive or operate dangerous equipment.  Next appointment should be in person Will be comprehensive with lab work

## 2020-04-27 NOTE — Progress Notes (Signed)
04/27/20- lab orders placed and mailed to patient

## 2020-04-28 LAB — HEPATIC FUNCTION PANEL
ALT: 24 IU/L (ref 0–32)
AST: 22 IU/L (ref 0–40)
Albumin: 4.2 g/dL (ref 3.8–4.8)
Alkaline Phosphatase: 129 IU/L — ABNORMAL HIGH (ref 44–121)
Bilirubin Total: 0.4 mg/dL (ref 0.0–1.2)
Bilirubin, Direct: 0.17 mg/dL (ref 0.00–0.40)
Total Protein: 7 g/dL (ref 6.0–8.5)

## 2020-04-28 LAB — MITOCHONDRIAL ANTIBODIES: Mitochondrial Ab: <20 Units (ref 0.0–20.0)

## 2020-05-02 ENCOUNTER — Other Ambulatory Visit: Payer: Self-pay

## 2020-05-02 ENCOUNTER — Other Ambulatory Visit: Payer: Self-pay | Admitting: Family Medicine

## 2020-05-02 ENCOUNTER — Telehealth: Payer: Self-pay | Admitting: Family Medicine

## 2020-05-02 DIAGNOSIS — E782 Mixed hyperlipidemia: Secondary | ICD-10-CM

## 2020-05-02 DIAGNOSIS — R072 Precordial pain: Secondary | ICD-10-CM

## 2020-05-02 MED ORDER — HYDROCODONE-ACETAMINOPHEN 10-325 MG PO TABS: ORAL_TABLET | ORAL | 0 refills | Status: DC

## 2020-05-02 NOTE — Progress Notes (Signed)
Will place order for BMET for CT scan.

## 2020-05-02 NOTE — Telephone Encounter (Signed)
Patient notified

## 2020-05-02 NOTE — Telephone Encounter (Signed)
Corrected prescriptions were sent in today, should be able to get filled today

## 2020-05-02 NOTE — Telephone Encounter (Signed)
Patient is requesting refill on hydrocodone 10/325 to be called into [facility] . She was told at pharmacy she had to wait until 2/2 to get refill . Please advise

## 2020-05-02 NOTE — Telephone Encounter (Signed)
Pt has script on file at pharm. Script is dated to be filled 2/2. Pt states she last got #10 filled and she takes 3 a day while dr Nicki Reaper was out of the office and another provider filled. Pt states she has been out for 2 weeks. I could not find the message on that or where that refill was sent in. I tried to call pharm and put on hold til I had to hang up. I will call back if you need me to find out when she last filled it.

## 2020-05-03 ENCOUNTER — Ambulatory Visit: Payer: PPO | Admitting: Family Medicine

## 2020-05-10 ENCOUNTER — Other Ambulatory Visit: Payer: Self-pay | Admitting: Family Medicine

## 2020-05-14 ENCOUNTER — Other Ambulatory Visit: Payer: Self-pay | Admitting: Gastroenterology

## 2020-05-16 ENCOUNTER — Other Ambulatory Visit: Payer: Self-pay

## 2020-05-16 ENCOUNTER — Telehealth (HOSPITAL_COMMUNITY): Payer: Self-pay | Admitting: Emergency Medicine

## 2020-05-16 ENCOUNTER — Ambulatory Visit: Payer: PPO | Admitting: Neurology

## 2020-05-16 ENCOUNTER — Encounter: Payer: Self-pay | Admitting: Neurology

## 2020-05-16 VITALS — BP 136/78 | HR 71 | Ht 62.0 in | Wt 212.0 lb

## 2020-05-16 DIAGNOSIS — G4733 Obstructive sleep apnea (adult) (pediatric): Secondary | ICD-10-CM | POA: Diagnosis not present

## 2020-05-16 DIAGNOSIS — R634 Abnormal weight loss: Secondary | ICD-10-CM

## 2020-05-16 DIAGNOSIS — R519 Headache, unspecified: Secondary | ICD-10-CM | POA: Diagnosis not present

## 2020-05-16 DIAGNOSIS — Z9989 Dependence on other enabling machines and devices: Secondary | ICD-10-CM

## 2020-05-16 NOTE — Patient Instructions (Signed)
21    Your procedure is scheduled on: 05/23/2020  Report to [facility] at   6:45  AM.  Call this number if you have problems the morning of surgery: [phone]   Remember:   Follow instructions on letter from office regarding when to stop eating and drinking        No Smoking the day of procedure      Take these medicines the morning of surgery with A SIP OF WATER: Dilantin, Zoloft, Metoprolol, Pantoprazole  Xanax and /or hydrocodone if needed   Do not wear jewelry, make-up or nail polish.  Do not wear lotions, powders, or perfumes. You may wear deodorant.                Do not bring valuables to the hospital.  Contacts, dentures or bridgework may not be worn into surgery.  Leave suitcase in the car. After surgery it may be brought to your room.  For patients admitted to the hospital, checkout time is 11:00 AM the day of discharge.   Patients discharged the day of surgery will not be allowed to drive home. Upper Endoscopy, Adult Upper endoscopy is a procedure to look inside the upper GI (gastrointestinal) tract. The upper GI tract is made up of:  The part of the body that moves food from your mouth to your stomach (esophagus).  The stomach.  The first part of your small intestine (duodenum). This procedure is also called esophagogastroduodenoscopy (EGD) or gastroscopy. In this procedure, your health care provider passes a thin, flexible tube (endoscope) through your mouth and down your esophagus into your stomach. A small camera is attached to the end of the tube. Images from the camera appear on a monitor in the exam room. During this procedure, your health care provider may also remove a small piece of tissue to be sent to a lab and examined under a microscope (biopsy). Your health care provider may do an upper endoscopy to diagnose cancers of the upper GI tract. You may also have this procedure to find the cause of other conditions, such as:  Stomach pain.  Heartburn.  Pain or  problems when swallowing.  Nausea and vomiting.  Stomach bleeding.  Stomach ulcers. Tell a health care provider about:  Any allergies you have.  All medicines you are taking, including vitamins, herbs, eye drops, creams, and over-the-counter medicines.  Any problems you or family members have had with anesthetic medicines.  Any blood disorders you have.  Any surgeries you have had.  Any medical conditions you have.  Whether you are pregnant or may be pregnant. What are the risks? Generally, this is a safe procedure. However, problems may occur, including:  Infection.  Bleeding.  Allergic reactions to medicines.  A tear or hole (perforation) in the esophagus, stomach, or duodenum. What happens before the procedure? Staying hydrated Follow instructions from your health care provider about hydration, which may include:  Up to 2 hours before the procedure - you may continue to drink clear liquids, such as water, clear fruit juice, black coffee, and plain tea.  Eating and drinking restrictions Follow instructions from your health care provider about eating and drinking, which may include:  8 hours before the procedure - stop eating heavy meals or foods, such as meat, fried foods, or fatty foods.  6 hours before the procedure - stop eating light meals or foods, such as toast or cereal.  6 hours before the procedure - stop drinking milk or drinks that  contain milk.  2 hours before the procedure - stop drinking clear liquids. Medicines Ask your health care provider about:  Changing or stopping your regular medicines. This is especially important if you are taking diabetes medicines or blood thinners.  Taking medicines such as aspirin and ibuprofen. These medicines can thin your blood. Do not take these medicines unless your health care provider tells you to take them.  Taking over-the-counter medicines, vitamins, herbs, and supplements. General instructions  Plan to  have someone take you home from the hospital or clinic.  If you will be going home right after the procedure, plan to have someone with you for 24 hours.  Ask your health care provider what steps will be taken to help prevent infection. What happens during the procedure?  1. An IV will be inserted into one of your veins. 2. You may be given one or more of the following: ? A medicine to help you relax (sedative). ? A medicine to numb the throat (local anesthetic). 3. You will lie on your left side on an exam table. 4. Your health care provider will pass the endoscope through your mouth and down your esophagus. 5. Your health care provider will use the scope to check the inside of your esophagus, stomach, and duodenum. Biopsies may be taken. 6. The endoscope will be removed. The procedure may vary among health care providers and hospitals. What happens after the procedure?  Your blood pressure, heart rate, breathing rate, and blood oxygen level will be monitored until you leave the hospital or clinic.  Do not drive for 24 hours if you were given a sedative during your procedure.  When your throat is no longer numb, you may be given some fluids to drink.  It is up to you to get the results of your procedure. Ask your health care provider, or the department that is doing the procedure, when your results will be ready. Summary  Upper endoscopy is a procedure to look inside the upper GI tract.  During the procedure, an IV will be inserted into one of your veins. You may be given a medicine to help you relax.  A medicine will be used to numb your throat.  The endoscope will be passed through your mouth and down your esophagus. This information is not intended to replace advice given to you by your health care provider. Make sure you discuss any questions you have with your health care provider. Document Revised: 09/17/2017 Document Reviewed: 08/25/2017 Elsevier Patient Education  [place] After  Please read the instructions outlined below and refer to this sheet in the next few weeks. These discharge instructions provide you with general information on caring for yourself after  you leave the hospital. Your doctor may also give you specific instructions. While your treatment has been planned according to the most current medical practices available, unavoidable complications occasionally occur. If you have any problems or questions after discharge, please call your doctor. HOME CARE INSTRUCTIONS Activity  You may resume your regular activity but move at a slower pace for the next 24 hours.   Take frequent rest periods for the next 24 hours.   Walking will help expel (get rid of) the air and reduce the bloated feeling in your abdomen.   No driving for 24 hours (because of the anesthesia (medicine) used during the test).   You may shower.   Do not sign any important legal documents or operate any machinery for 24 hours (because of the anesthesia used during the test).  Nutrition  Drink plenty of fluids.   You may resume your normal diet.   Begin with a light meal and progress to your normal diet.   Avoid alcoholic beverages for 24 hours or as instructed by your caregiver.  Medications You may resume your normal medications unless your caregiver tells you otherwise. What you can expect today  You may experience abdominal discomfort such as a feeling of fullness or "gas" pains.   You may experience a sore throat for 2 to 3 days. This is normal. Gargling with salt water may help this.  Follow-up Your doctor will discuss the results of your test with you. SEEK IMMEDIATE MEDICAL CARE IF:  You have excessive nausea (feeling sick to your stomach) and/or vomiting.   You have severe abdominal pain and distention  (swelling).   You have trouble swallowing.   You have a temperature over 100 F (37.8 C).   You have rectal bleeding or vomiting of blood.  Document Released: 11/07/2003 Document Revised: 03/14/2011 Document Reviewed: 05/20/2007

## 2020-05-16 NOTE — Progress Notes (Signed)
Subjective:    Patient ID: [name] is a 65 y.o. female.  HPI     Star Age, MD, PhD [facility] [address], [address]O. [place], [place] [address]  Dear [name],   I saw your patient, Donzella Carrol, upon your kind request in my sleep clinic today for initial consultation of her sleep disorder, in particular, evaluation of her prior diagnosis of obstructive sleep apnea.  The patient is unaccompanied today.  As you know, [name] is a 65 year old right-handed woman with an underlying medical history of seizure disorder, peripheral neuropathy, hypertension, anxiety, depression, chronic pain, and  obesity, who was diagnosed with obstructive sleep apnea many years ago, over 12 years ago.  Prior sleep study results are not available for my review today.  She has been on CPAP therapy for years but has not seen a sleep provider in years.  Her current DME company is [employer].  She may want to switch DME providers as her husband is established in a different company.  She is currently using a Phillips REMstar plus CPAP machine, pressure at 11 cm.  I was able to review a download report from 04/16/2020 through 05/15/2020, during which time she used her machine 30 out of 30 days with percent use days greater than 4 hours at 100%, indicating superb compliance with an average usage of 7 hours and 58 minutes, pressure at 11 cm, residual AHI information and leak information not available on this report.  She was not aware of the recall and has not registered her machine yet.  She uses a DreamWear full facemask.  She is generally up-to-date with her supplies typically, last mask was provided about 4 to 5 months ago.  I reviewed your office note from 03/21/2020.  Her Epworth sleepiness score is 4 out of 24, fatigue severity score is 15 out of 63.  She is generally compliant with treatment and typically does not go without it.  She would be willing to pursue a sleep  study to qualify for a new machine.  She is married and lives with her husband.  She has 2 grown daughters and 3 grandchildren.  They do not have any pets in the household.  She is retired.  Bedtime is generally around 9 PM but she typically falls asleep around 10 or 10:30 PM, rise time is around 6 AM.  She does not typically have any nocturia.  She has rare morning headaches, may be up to twice a month.  She quit smoking about 40 years ago and drinks alcohol rarely, on special occasions and does not drink any caffeine, only decaf soda and coffee.  Of note, she has had some weight loss, she has not really worked on any weight reduction and reports an approximately 37 pound weight loss in the last 7 to 8 months.  She had a colonoscopy in August of last year and is scheduled to have an upper GI endoscopy next week.  Her Past Medical History Is Significant For: Past Medical History:  Diagnosis Date  . Anxiety   . Chronic pain   . Depression   . Hypertension   . Morbid obesity ([place]) 03/20/2018  . Neuropathy   . Peripheral neuropathy   . Peripheral neuropathy   . Seizures ([place])    last one 1992- Increased BP with delivery and is on meds still.  . Sleep apnea    CPAP x 15 years    Her Past Surgical History  Is Significant For: Past Surgical History:  Procedure Laterality Date  . ABDOMINAL HYSTERECTOMY     ovaries present, no cancer,approx 2001  . BACK SURGERY    . CARPAL TUNNEL RELEASE     Bilateral  . CATARACT EXTRACTION W/PHACO Right 06/20/2015   Procedure: CATARACT EXTRACTION PHACO AND INTRAOCULAR LENS PLACEMENT (IOC);  Surgeon: Rutherford Guys, MD;  Location: [facility];  Service: Ophthalmology;  Laterality: Right;  CDE:6.25  . CATARACT EXTRACTION W/PHACO Left 08/29/2015   Procedure: CATARACT EXTRACTION PHACO AND INTRAOCULAR LENS PLACEMENT (IOC);  Surgeon: Rutherford Guys, MD;  Location: [facility];  Service: Ophthalmology;  Laterality: Left;  CDE: 4.11  . CESAREAN SECTION     x2  . CHOLECYSTECTOMY     . COLONOSCOPY  10/14/2002   [id number] involving descending colon, splenic flexure and distal transverse colon with most extensive inflammation in the splenic flexure area.  These changes are typical of ischemic colitis.  Biopsy taken.  . COLONOSCOPY N/A 08/04/2012   SLF: POLYPOID LESION IN AT THE APPENDICEAL ORIFICE/Polyp  in the descending colon/Moderate melanosis throughout the entire examined colon/ The colon IS redundant. small tubular adenoma.   . COLONOSCOPY WITH PROPOFOL N/A 11/17/2018   3 tubular adenomas removed, 3 hyperplastic polyps, next colonoscopy in 11/2021.   Marland Kitchen FOOT SURGERY Bilateral 2004   hammer toe repairs  . PARS PLANA VITRECTOMY W/ REPAIR OF MACULAR HOLE    . POLYPECTOMY  11/17/2018   Procedure: POLYPECTOMY;  Surgeon: Danie Binder, MD;  Location: [facility];  Service: Endoscopy;;    Her Family History Is Significant For: Family History  Problem Relation Age of Onset  . Colon polyps Mother 58       benign   . Hypertension Maternal Grandmother   . Heart attack Maternal Grandfather   . Emphysema Father   . Asthma Father   . Colon cancer Neg Hx   . Liver disease Neg Hx     Her Social History Is Significant For: Social History   Socioeconomic History  . Marital status: Married    Spouse name: Not on file  . Number of children: 2  . Years of education: 88  . Highest education level: Not on file  Occupational History  . Occupation: disability  Tobacco Use  . Smoking status: Never Smoker  . Smokeless tobacco: Never Used  . Tobacco comment: smoked x 4 years many years ago  Substance and Sexual Activity  . Alcohol use: Not Currently  . Drug use: No  . Sexual activity: Yes    Birth control/protection: Surgical  Other Topics Concern  . Not on file  Social History Narrative   lives with husband   Social Determinants of Health   Financial Resource Strain: Not on file  Food Insecurity: Not on file  Transportation Needs: Not on file  Physical  Activity: Not on file  Stress: Not on file  Social Connections: Not on file    Her Allergies Are:  No Known Allergies:   Her Current Medications Are:  Outpatient Encounter Medications as of 05/16/2020  Medication Sig  . ALPRAZolam (XANAX) 0.5 MG tablet 1/2 tablet to a full tablet for severe anxiousness use sparingly not with hydrocodone  . APPLE CIDER VINEGAR PO Take 1 tablet by mouth daily.  Marland Kitchen ascorbic acid (VITAMIN C) 500 MG tablet Take 500 mg by mouth daily.  . cholecalciferol (VITAMIN D3) 25 MCG (1000 UNIT) tablet Take 1,000 Units by mouth daily.  . Cinnamon 500 MG capsule Take 500 mg  by mouth in the morning and at bedtime.  . dicyclomine (BENTYL) 10 MG capsule TAKE 1 CAPSULE BY MOUTH EVERY MORNING FOR DIARRHEA PREVENTION. MAY TAKE UP TO 3 TIMES A DAY FOR ABDOMINAL CRAMPS AND LOOSE STOOL  . hydrochlorothiazide (HYDRODIURIL) 25 MG tablet TAKE 1 TABLET(25 MG) BY MOUTH DAILY (Patient taking differently: Take 25 mg by mouth daily. TAKE 1 TABLET(25 MG) BY MOUTH DAILY)  . HYDROcodone-acetaminophen (NORCO) 10-325 MG tablet 1 taken 3 times daily as needed for pain  . metFORMIN (GLUCOPHAGE) 500 MG tablet TAKE 1 TABLET BY MOUTH DAILY  . metoprolol tartrate (LOPRESSOR) 50 MG tablet Take one tablet by mouth two hours prior to your CT procedure.  . pantoprazole (PROTONIX) 40 MG tablet TAKE 1 TABLET(40 MG) BY MOUTH DAILY (Patient taking differently: Take 40 mg by mouth daily. TAKE 1 TABLET(40 MG) BY MOUTH DAILY)  . phenytoin (DILANTIN) 100 MG ER capsule TAKE 2 CAPSULES BY MOUTH EVERY MORNING AND 2 CAPSULE BY MOUTH EVERY EVENING (Patient taking differently: Take 200 mg by mouth 2 (two) times daily. TAKE 2 CAPSULES BY MOUTH EVERY MORNING AND 2 CAPSULE BY MOUTH EVERY EVENING)  . potassium chloride SA (KLOR-CON) 20 MEQ tablet Take 20 mEq by mouth 2 (two) times daily.  . rosuvastatin (CRESTOR) 5 MG tablet TAKE 1 TABLET(5 MG) BY MOUTH DAILY (Patient taking differently: Take 5 mg by mouth daily. TAKE 1 TABLET(5  MG) BY MOUTH DAILY)  . sertraline (ZOLOFT) 100 MG tablet 1 and 1/2 tablet daily  . torsemide (DEMADEX) 20 MG tablet Take 1 tablet (20 mg total) by mouth daily as needed (fluid or swelling).  . [DISCONTINUED] acetaminophen (TYLENOL) 500 MG tablet Take 1,000 mg by mouth every 6 (six) hours as needed for moderate pain.  . [DISCONTINUED] HYDROcodone-acetaminophen (NORCO) 10-325 MG tablet 1 taken 3 times daily as needed (Patient not taking: Reported on 05/16/2020)  . [DISCONTINUED] HYDROcodone-acetaminophen (NORCO) 10-325 MG tablet Take one tablet po TID prn pain (Patient not taking: Reported on 05/16/2020)   No facility-administered encounter medications on file as of 05/16/2020.  :  Review of Systems:  Out of a complete 14 point review of systems, all are reviewed and negative with the exception of these symptoms as listed below:  Review of Systems  Neurological:       Here for sleep consult. Prior sleep study years ago. Reports machine is several years old.  She is using a phillips machine was unaware of the recall for it. Denies having exposure to heat and socleaner.   Epworth Sleepiness Scale 0= would never doze 1= slight chance of dozing 2= moderate chance of dozing 3= high chance of dozing  Sitting and reading:1 Watching TV:0 Sitting inactive in a public place (ex. Theater or meeting):0 As a passenger in a car for an hour without a break:0 Lying down to rest in the afternoon:2 Sitting and talking to someone:0 Sitting quietly after lunch (no alcohol):1 In a car, while stopped in traffic:0 Total:4     Objective:  Neurological Exam  Physical Exam Physical Examination:   Vitals:   05/16/20 1330  BP: 136/78  Pulse: 71  SpO2: 95%    General Examination: The patient is a very pleasant 65 y.o. female in no acute distress. She appears well-developed and well-nourished and well groomed.   HEENT: Normocephalic, atraumatic, pupils are equal, round and reactive to light, extraocular  tracking is good without limitation to gaze excursion or nystagmus noted. Hearing is grossly intact. Face is symmetric with normal facial  animation. Speech is clear with no dysarthria noted. There is no hypophonia. There is no lip, neck/head, jaw or voice tremor. Neck is supple with full range of passive and active motion. There are no carotid bruits on auscultation. Oropharynx exam reveals: mild mouth dryness, adequate dental hygiene and moderate airway crowding, due to redundant soft palate, tonsillar size of about 1+.  Neck circumference of 17-1/8 inches. Tongue protrudes centrally and palate elevates symmetrically.   Chest: Clear to auscultation without wheezing, rhonchi or crackles noted.  Heart: S1+S2+0, regular and normal without murmurs, rubs or gallops noted.   Abdomen: Soft, non-tender and non-distended with normal bowel sounds appreciated on auscultation.  Extremities: There is no pitting edema in the distal lower extremities bilaterally.   Skin: Warm and dry without trophic changes noted.   Musculoskeletal: exam reveals no obvious joint deformities, tenderness or joint swelling or erythema.  Reports some low back discomfort.  Neurologically:  Mental status: The patient is awake, alert and oriented in all 4 spheres. Her immediate and remote memory, attention, language skills and fund of knowledge are appropriate. There is no evidence of aphasia, agnosia, apraxia or anomia. Speech is clear with normal prosody and enunciation. Thought process is linear. Mood is normal and affect is normal.  Cranial nerves II - XII are as described above under HEENT exam.  Motor exam: Normal bulk, strength and tone is noted. There is no tremor, fine motor skills and coordination: grossly intact.  Cerebellar testing: No dysmetria or intention tremor. There is no truncal or gait ataxia.  Sensory exam: intact to light touch in the upper and lower extremities.  Gait, station and balance: She stands easily. No  veering to one side is noted. No leaning to one side is noted. Posture is age-appropriate and stance is narrow based. Gait shows normal stride length and normal pace. No problems turning are noted.   Assessment and Plan:   In summary, DAVIONA HERBERT is a very pleasant 65 y.o.-year old female with an underlying medical history of seizure disorder, peripheral neuropathy, hypertension, anxiety, depression, chronic pain, and  obesity, who presents for evaluation of her obstructive sleep apnea.  She is currently on CPAP of 11 cm via full facemask.  Sleep testing was several years ago and she has an older machine.  She is compliant with treatment.  Residual AHI information is not available on this download.  Prior sleep study results are not available for review.  She should be eligible for a new machine.  We will proceed with a sleep study, hopefully a split-night sleep study in the lab versus a home sleep test, depending on insurance coverage for testing.  She is advised to go on the [employer] to see if her current CPAP machine is affected by the recall.  She is encouraged to continue to use her current machine, she does not appear to be high risk for problems with this machine, it may not be affected by the recall in the first place.  She is advised that we will call her soon to schedule her sleep study and plan a follow-up after testing.  We also talked about alternative treatment options to CPAP therapy, in particular dental treatment with a dental device or surgical treatment options including inspire.  I plan to see her back after testing.  I answered all her questions today and she was in agreement.   Thank you very much for allowing me to participate in the care of this nice patient.  If I can be of any further assistance to you please do not hesitate to talk to me.   Sincerely,   Star Age, MD, PhD

## 2020-05-16 NOTE — Patient Instructions (Signed)
  Here is what we discussed today and what we came up with as our plan for you:   Please check on the [facility] website if you were CPAP machine is affected by the recall.  You should register your machine.  I believe the website is:   [email or web address]   Based on your symptoms and your exam I believe you are still at risk for obstructive sleep apnea and would benefit from reevaluation as it has been many years and you need new supplies and updated machine. Therefore, I think we should proceed with a sleep study to determine how severe your sleep apnea is. If you have more than mild OSA, I want you to consider ongoing treatment with CPAP. Please remember, the risks and ramifications of moderate to severe obstructive sleep apnea or OSA are: Cardiovascular disease, including congestive heart failure, stroke, difficult to control hypertension, arrhythmias, and even type 2 diabetes has been linked to untreated OSA. Sleep apnea causes disruption of sleep and sleep deprivation in most cases, which, in turn, can cause recurrent headaches, problems with memory, mood, concentration, focus, and vigilance. Most people with untreated sleep apnea report excessive daytime sleepiness, which can affect their ability to drive. Please do not drive if you feel sleepy.   I will likely see you back after your sleep study to go over the test results and where to go from there. We will call you after your sleep study to advise about the results (most likely, you will hear from Yaak, my nurse) and to set up an appointment at the time, as necessary.    Our sleep lab administrative assistant will call you to schedule your sleep study. If you don't hear back from her by about 2 weeks from now, please feel free to call her at [phone]. You can leave a message with your phone number and concerns, if you get the voicemail box. She will call back as soon as  possible.

## 2020-05-16 NOTE — Telephone Encounter (Signed)
Attempted to call patient regarding upcoming cardiac CT appointment. °Left message on voicemail with name and callback number °Karthikeya Funke RN Navigator Cardiac Imaging °[facility] °[phone] Office °[phone] Cell ° °

## 2020-05-17 ENCOUNTER — Encounter (HOSPITAL_BASED_OUTPATIENT_CLINIC_OR_DEPARTMENT_OTHER): Payer: PPO | Admitting: Internal Medicine

## 2020-05-17 ENCOUNTER — Telehealth (HOSPITAL_COMMUNITY): Payer: Self-pay | Admitting: Emergency Medicine

## 2020-05-17 NOTE — Telephone Encounter (Signed)
Pt returning phone call regarding upcoming cardiac imaging study; pt verbalizes understanding of appt date/time, parking situation and where to check in, pre-test NPO status and medications ordered, and verified current allergies; name and call back number provided for further questions should they arise Marchia Bond RN Navigator [facility] [phone] office [phone] cell   I requested patient have labs drawn today. She states she is unavailable to have labs done since she is caring for granddaughter. I told her we would obtain an istat creat prior to scan Pt verbalized understanding.  Holding HCTZ, torsemide, taking 50mg  metop 2 hr PTA Clarise Cruz

## 2020-05-18 ENCOUNTER — Other Ambulatory Visit: Payer: Self-pay

## 2020-05-18 ENCOUNTER — Ambulatory Visit (HOSPITAL_COMMUNITY)
Admission: RE | Admit: 2020-05-18 | Discharge: 2020-05-18 | Disposition: A | Discharge status: Home / Self Care | Payer: PPO | Source: Ambulatory Visit | Attending: Interventional Cardiology | Admitting: Interventional Cardiology

## 2020-05-18 DIAGNOSIS — R072 Precordial pain: Secondary | ICD-10-CM | POA: Diagnosis not present

## 2020-05-18 DIAGNOSIS — Z006 Encounter for examination for normal comparison and control in clinical research program: Secondary | ICD-10-CM

## 2020-05-18 LAB — POCT I-STAT CREATININE: Creatinine, Ser: 0.6 mg/dL (ref 0.44–1.00)

## 2020-05-18 MED ORDER — IOHEXOL 350 MG/ML SOLN
80.0000 mL | Freq: Once | INTRAVENOUS | Status: AC | PRN
  Administered 2020-05-18: 80 mL via INTRAVENOUS

## 2020-05-18 MED ORDER — NITROGLYCERIN 0.4 MG SL SUBL
SUBLINGUAL_TABLET | SUBLINGUAL | Status: AC
  Filled 2020-05-18: qty 2

## 2020-05-18 MED ORDER — NITROGLYCERIN 0.4 MG SL SUBL
0.8000 mg | SUBLINGUAL_TABLET | Freq: Once | SUBLINGUAL | Status: AC
  Administered 2020-05-18: 0.8 mg via SUBLINGUAL

## 2020-05-18 NOTE — Research (Signed)
IDENTIFY Informed Consent                  Subject Name: Deborah Carroll   Subject met inclusion and exclusion criteria.  The informed consent form, study requirements and expectations were reviewed with the subject and questions and concerns were addressed prior to the signing of the consent form.  The subject verbalized understanding of the trial requirements.  The subject agreed to participate in the IDENTIFY trial and signed the informed consent at 13:04)PM on 05/18/20.  The informed consent was obtained prior to performance of any protocol-specific procedures for the subject.  A copy of the signed informed consent was given to the subject and a copy was placed in the subject's medical record.   Meade Maw, Naval architect

## 2020-05-18 NOTE — Progress Notes (Signed)
CT scan completed. Tolerated well. D/C home ambulatory with husband, awake and alert. In no distress 

## 2020-05-19 ENCOUNTER — Other Ambulatory Visit (HOSPITAL_COMMUNITY)
Admission: RE | Admit: 2020-05-19 | Discharge: 2020-05-19 | Disposition: A | Discharge status: Home / Self Care | Payer: PPO | Source: Ambulatory Visit | Attending: Internal Medicine | Admitting: Internal Medicine

## 2020-05-19 ENCOUNTER — Encounter (HOSPITAL_COMMUNITY): Payer: Self-pay

## 2020-05-19 ENCOUNTER — Other Ambulatory Visit: Payer: Self-pay

## 2020-05-19 ENCOUNTER — Encounter (HOSPITAL_COMMUNITY)
Admission: RE | Admit: 2020-05-19 | Discharge: 2020-05-19 | Disposition: A | Discharge status: Home / Self Care | Payer: PPO | Source: Ambulatory Visit | Attending: Internal Medicine | Admitting: Internal Medicine

## 2020-05-19 DIAGNOSIS — Z01812 Encounter for preprocedural laboratory examination: Secondary | ICD-10-CM | POA: Diagnosis not present

## 2020-05-19 DIAGNOSIS — Z20822 Contact with and (suspected) exposure to covid-19: Secondary | ICD-10-CM | POA: Insufficient documentation

## 2020-05-19 HISTORY — DX: Type 2 diabetes mellitus without complications: E11.9

## 2020-05-19 HISTORY — DX: Gastro-esophageal reflux disease without esophagitis: K21.9

## 2020-05-19 LAB — BASIC METABOLIC PANEL
Anion gap: 9 (ref 5–15)
BUN: 8 mg/dL (ref 8–23)
CO2: 28 mmol/L (ref 22–32)
Calcium: 9.1 mg/dL (ref 8.9–10.3)
Chloride: 104 mmol/L (ref 98–111)
Creatinine, Ser: 0.59 mg/dL (ref 0.44–1.00)
GFR, Estimated: >60 mL/min (ref >60)
Glucose, Bld: 129 mg/dL — ABNORMAL HIGH (ref 70–99)
Potassium: 3.3 mmol/L — ABNORMAL LOW (ref 3.5–5.1)
Sodium: 141 mmol/L (ref 135–145)

## 2020-05-19 LAB — SARS CORONAVIRUS 2 (TAT 6-24 HRS): SARS Coronavirus 2: NEGATIVE

## 2020-05-23 ENCOUNTER — Ambulatory Visit (HOSPITAL_COMMUNITY)
Admission: RE | Admit: 2020-05-23 | Discharge: 2020-05-23 | Disposition: A | Discharge status: Home / Self Care | Payer: PPO | Attending: Internal Medicine | Admitting: Internal Medicine

## 2020-05-23 ENCOUNTER — Ambulatory Visit (HOSPITAL_COMMUNITY): Payer: PPO | Admitting: Anesthesiology

## 2020-05-23 ENCOUNTER — Other Ambulatory Visit: Payer: Self-pay

## 2020-05-23 ENCOUNTER — Encounter (HOSPITAL_COMMUNITY): Payer: Self-pay

## 2020-05-23 ENCOUNTER — Encounter (HOSPITAL_COMMUNITY)
Admission: RE | Disposition: A | Discharge status: Home / Self Care | Payer: Self-pay | Source: Home / Self Care | Attending: Internal Medicine

## 2020-05-23 DIAGNOSIS — G4733 Obstructive sleep apnea (adult) (pediatric): Secondary | ICD-10-CM | POA: Diagnosis not present

## 2020-05-23 DIAGNOSIS — K3189 Other diseases of stomach and duodenum: Secondary | ICD-10-CM | POA: Diagnosis not present

## 2020-05-23 DIAGNOSIS — K319 Disease of stomach and duodenum, unspecified: Secondary | ICD-10-CM | POA: Diagnosis not present

## 2020-05-23 DIAGNOSIS — R6881 Early satiety: Secondary | ICD-10-CM

## 2020-05-23 DIAGNOSIS — R12 Heartburn: Secondary | ICD-10-CM | POA: Diagnosis not present

## 2020-05-23 DIAGNOSIS — K317 Polyp of stomach and duodenum: Secondary | ICD-10-CM

## 2020-05-23 DIAGNOSIS — Z79899 Other long term (current) drug therapy: Secondary | ICD-10-CM | POA: Diagnosis not present

## 2020-05-23 DIAGNOSIS — Z7984 Long term (current) use of oral hypoglycemic drugs: Secondary | ICD-10-CM | POA: Insufficient documentation

## 2020-05-23 DIAGNOSIS — K2970 Gastritis, unspecified, without bleeding: Secondary | ICD-10-CM

## 2020-05-23 DIAGNOSIS — R110 Nausea: Secondary | ICD-10-CM | POA: Diagnosis not present

## 2020-05-23 HISTORY — PX: POLYPECTOMY: SHX5525

## 2020-05-23 HISTORY — PX: ESOPHAGOGASTRODUODENOSCOPY (EGD) WITH PROPOFOL: SHX5813

## 2020-05-23 HISTORY — PX: BIOPSY: SHX5522

## 2020-05-23 LAB — GLUCOSE, CAPILLARY
Glucose-Capillary: 118 mg/dL — ABNORMAL HIGH (ref 70–99)
Glucose-Capillary: 106 mg/dL — ABNORMAL HIGH (ref 70–99)

## 2020-05-23 SURGERY — ESOPHAGOGASTRODUODENOSCOPY (EGD) WITH PROPOFOL
Anesthesia: General

## 2020-05-23 MED ORDER — KETAMINE HCL 50 MG/5ML IJ SOSY
PREFILLED_SYRINGE | INTRAMUSCULAR | Status: AC
  Filled 2020-05-23: qty 5

## 2020-05-23 MED ORDER — LIDOCAINE VISCOUS HCL 2 % MT SOLN: 15.0000 mL | Freq: Once | OROMUCOSAL | Status: DC

## 2020-05-23 MED ORDER — PROPOFOL 10 MG/ML IV BOLUS
INTRAVENOUS | Status: DC | PRN
  Administered 2020-05-23: 20 mg via INTRAVENOUS

## 2020-05-23 MED ORDER — PROPOFOL 10 MG/ML IV BOLUS
INTRAVENOUS | Status: AC
  Filled 2020-05-23: qty 20

## 2020-05-23 MED ORDER — KETAMINE HCL 10 MG/ML IJ SOLN
INTRAMUSCULAR | Status: DC | PRN
Start: 2020-05-23 — End: 2020-05-23
  Administered 2020-05-23: 15 mg via INTRAVENOUS

## 2020-05-23 MED ORDER — LACTATED RINGERS IV SOLN: INTRAVENOUS | Status: DC

## 2020-05-23 MED ORDER — LACTATED RINGERS IV SOLN: INTRAVENOUS | Status: DC | PRN

## 2020-05-23 MED ORDER — GLYCOPYRROLATE 0.2 MG/ML IJ SOLN: 0.2000 mg | Freq: Once | INTRAMUSCULAR | Status: DC

## 2020-05-23 MED ORDER — LIDOCAINE HCL (CARDIAC) PF 100 MG/5ML IV SOSY
PREFILLED_SYRINGE | INTRAVENOUS | Status: DC | PRN
  Administered 2020-05-23: 50 mg via INTRAVENOUS

## 2020-05-23 MED ORDER — GLYCOPYRROLATE 0.2 MG/ML IJ SOLN
INTRAMUSCULAR | Status: DC | PRN
Start: 2020-05-23 — End: 2020-05-23
  Administered 2020-05-23: .1 mg via INTRAVENOUS

## 2020-05-23 MED ORDER — PROPOFOL 500 MG/50ML IV EMUL
INTRAVENOUS | Status: DC | PRN
  Administered 2020-05-23: 150 ug/kg/min via INTRAVENOUS

## 2020-05-23 NOTE — Anesthesia Preprocedure Evaluation (Addendum)
Anesthesia Evaluation  Patient identified by MRN, date of birth, ID band Patient awake    Reviewed: Allergy & Precautions, NPO status , Patient's Chart, lab work & pertinent test results, reviewed documented beta blocker date and time   History of Anesthesia Complications Negative for: history of anesthetic complications  Airway Mallampati: II  TM Distance: >3 FB Neck ROM: Full    Dental  (+) Dental Advisory Given, Implants   Pulmonary sleep apnea and Continuous Positive Airway Pressure Ventilation ,    Pulmonary exam normal breath sounds clear to auscultation       Cardiovascular Exercise Tolerance: Good hypertension, Pt. on medications and Pt. on home beta blockers Normal cardiovascular exam Rhythm:Regular Rate:Normal     Neuro/Psych Seizures -, Well Controlled,  PSYCHIATRIC DISORDERS Anxiety Depression  Neuromuscular disease    GI/Hepatic Neg liver ROS, GERD  Medicated and Controlled,  Endo/Other  diabetes, Well Controlled, Type 2, Oral Hypoglycemic Agents  Renal/GU negative Renal ROS     Musculoskeletal  (+) Arthritis  (back pain),   Abdominal   Peds  Hematology   Anesthesia Other Findings   Reproductive/Obstetrics                            Anesthesia Physical Anesthesia Plan  ASA: III  Anesthesia Plan: General   Post-op Pain Management:    Induction: Intravenous  PONV Risk Score and Plan: TIVA  Airway Management Planned: Nasal Cannula and Natural Airway  Additional Equipment:   Intra-op Plan:   Post-operative Plan:   Informed Consent: I have reviewed the patients History and Physical, chart, labs and discussed the procedure including the risks, benefits and alternatives for the proposed anesthesia with the patient or authorized representative who has indicated his/her understanding and acceptance.     Dental advisory given  Plan Discussed with: CRNA and  Surgeon  Anesthesia Plan Comments:         Anesthesia Quick Evaluation

## 2020-05-23 NOTE — Op Note (Signed)
[facility] Patient Name: Deborah Carroll Procedure Date: 05/23/2020 8:33 AM MRN: [id number] Date of Birth: 30-Jul-1955 Attending MD: Elon Alas. Abbey Chatters DO CSN: [id number] Age: 65 Admit Type: Outpatient Procedure:                Upper GI endoscopy Indications:              Heartburn, Early satiety, Nausea Providers:                Elon Alas. Abbey Chatters, DO, Gwenlyn Fudge, RN, Nelma Rothman,                            Technician Referring MD:              Medicines:                See the Anesthesia note for documentation of the                            administered medications Complications:            No immediate complications. Estimated Blood Loss:     Estimated blood loss was minimal. Procedure:                Pre-Anesthesia Assessment:                           - The anesthesia plan was to use monitored                            anesthesia care (MAC).                           After obtaining informed consent, the endoscope was                            passed under direct vision. Throughout the                            procedure, the patient's blood pressure, pulse, and                            oxygen saturations were monitored continuously. The                            GIF-H190 ([id number]) scope was introduced through the                            mouth, and advanced to the second part of duodenum.                            The upper GI endoscopy was accomplished without                            difficulty. The patient tolerated the procedure                            well. Scope In: 8:43:29  AM Scope Out: 8:53:07 AM Total Procedure Duration: 0 hours 9 minutes 38 seconds  Findings:      There is no endoscopic evidence of bleeding, areas of erosion,       esophagitis, hiatal hernia, ulcerations or varices in the entire       esophagus.      Localized moderate inflammation characterized by erosions and erythema       was found in the gastric antrum. Biopsies were taken  with a cold forceps       for Helicobacter pylori testing.      Numerous sessile polyps with no bleeding and no stigmata of recent       bleeding were found in the gastric fundus and in the gastric body       consistent with fundic gland polyps. Three of the largest polyps (<1 cm)       were removed with a hot snare. Resection and retrieval were complete.       Polyps retrieved with Jabier Mutton net.      The duodenal bulb, first portion of the duodenum and second portion of       the duodenum were normal. Biopsies for histology were taken with a cold       forceps for evaluation of celiac disease. Impression:               - Gastritis. Biopsied.                           - Multiple gastric polyps. Resected and retrieved.                           - Normal duodenal bulb, first portion of the                            duodenum and second portion of the duodenum.                            Biopsied. Moderate Sedation:      Per Anesthesia Care Recommendation:           - Patient has a contact number available for                            emergencies. The signs and symptoms of potential                            delayed complications were discussed with the                            patient. Return to normal activities tomorrow.                            Written discharge instructions were provided to the                            patient.                           - Resume previous diet.                           -  Continue present medications.                           - Await pathology results.                           - Return to GI clinic in 3 months.                           - Use a proton pump inhibitor PO BID.                           - No ibuprofen, naproxen, or other non-steroidal                            anti-inflammatory drugs. Procedure Code(s):        --- Professional ---                           [phone], Esophagogastroduodenoscopy, flexible,                             transoral; with removal of tumor(s), polyp(s), or                            other lesion(s) by snare technique                           43239, 25, Esophagogastroduodenoscopy, flexible,                            transoral; with biopsy, single or multiple Diagnosis Code(s):        --- Professional ---                           K29.70, Gastritis, unspecified, without bleeding                           K31.7, Polyp of stomach and duodenum                           R12, Heartburn                           R68.81, Early satiety                           R11.0, Nausea CPT copyright 2019 American Medical Association. All rights reserved. The codes documented in this report are preliminary and upon coder review may  be revised to meet current compliance requirements. Elon Alas. Abbey Chatters, DO [place] Abbey Chatters, DO 05/23/2020 8:59:09 AM This report has been signed electronically. Number of Addenda: 0

## 2020-05-23 NOTE — Transfer of Care (Signed)
Immediate Anesthesia Transfer of Care Note  Patient: Deborah Carroll  Procedure(s) Performed: ESOPHAGOGASTRODUODENOSCOPY (EGD) WITH PROPOFOL (N/A ) BIOPSY POLYPECTOMY  Patient Location: PACU  Anesthesia Type:General  Level of Consciousness: awake  Airway & Oxygen Therapy: Patient Spontanous Breathing  Post-op Assessment: Report given to RN and Post -op Vital signs reviewed and stable  Post vital signs: Reviewed and stable  Last Vitals:  Vitals Value Taken Time  BP    Temp    Pulse    Resp    SpO2      Last Pain:  Vitals:   05/23/20 0837  TempSrc:   PainSc: 0-No pain         Complications: No complications documented.

## 2020-05-23 NOTE — Anesthesia Postprocedure Evaluation (Signed)
Anesthesia Post Note  Patient: ANJULIE DIPIERRO  Procedure(s) Performed: ESOPHAGOGASTRODUODENOSCOPY (EGD) WITH PROPOFOL (N/A ) BIOPSY POLYPECTOMY  Patient location during evaluation: Phase II Anesthesia Type: General Level of consciousness: awake Pain management: pain level controlled Vital Signs Assessment: post-procedure vital signs reviewed and stable Respiratory status: spontaneous breathing, nonlabored ventilation and respiratory function stable Cardiovascular status: stable Postop Assessment: no apparent nausea or vomiting Anesthetic complications: no   No complications documented.   Last Vitals:  Vitals:   05/23/20 0737  BP: 130/66  Pulse: (!) 56  Resp: 14  Temp: 36.9 C  SpO2: 98%    Last Pain:  Vitals:   05/23/20 0837  TempSrc:   PainSc: 0-No pain                 Karna Dupes

## 2020-05-23 NOTE — Discharge Instructions (Signed)
Monitored Anesthesia Care, Care After This sheet gives you information about how to care for yourself after your procedure. Your health care provider may also give you more specific instructions. If you have problems or questions, contact your health care provider. What can I expect after the procedure? After the procedure, it is common to have:  Tiredness.  Forgetfulness about what happened after the procedure.  Impaired judgment for important decisions.  Nausea or vomiting.  Some difficulty with balance. Follow these instructions at home: For the time period you were told by your health care provider:  Rest as needed.  Do not participate in activities where you could fall or become injured.  Do not drive or use machinery.  Do not drink alcohol.  Do not take sleeping pills or medicines that cause drowsiness.  Do not make important decisions or sign legal documents.  Do not take care of children on your own.      Eating and drinking  Follow the diet that is recommended by your health care provider.  Drink enough fluid to keep your urine pale yellow.  If you vomit: ? Drink water, juice, or soup when you can drink without vomiting. ? Make sure you have little or no nausea before eating solid foods. General instructions  Have a responsible adult stay with you for the time you are told. It is important to have someone help care for you until you are awake and alert.  Take over-the-counter and prescription medicines only as told by your health care provider.  If you have sleep apnea, surgery and certain medicines can increase your risk for breathing problems. Follow instructions from your health care provider about wearing your sleep device: ? Anytime you are sleeping, including during daytime naps. ? While taking prescription pain medicines, sleeping medicines, or medicines that make you drowsy.  Avoid smoking.  Keep all follow-up visits as told by your health care  provider. This is important. Contact a health care provider if:  You keep feeling nauseous or you keep vomiting.  You feel light-headed.  You are still sleepy or having trouble with balance after 24 hours.  You develop a rash.  You have a fever.  You have redness or swelling around the IV site. Get help right away if:  You have trouble breathing.  You have new-onset confusion at home. Summary  For several hours after your procedure, you may feel tired. You may also be forgetful and have poor judgment.  Have a responsible adult stay with you for the time you are told. It is important to have someone help care for you until you are awake and alert.  Rest as told. Do not drive or operate machinery. Do not drink alcohol or take sleeping pills.  Get help right away if you have trouble breathing, or if you suddenly become confused. This information is not intended to replace advice given to you by your health care provider. Make sure you discuss any questions you have with your health care provider. Document Revised: 12/09/2019 Document Reviewed: 02/25/2019 Elsevier Patient Education  2021 [place].   Upper Endoscopy, Adult, Care After This sheet gives you information about how to care for yourself after your procedure. Your health care provider may also give you more specific instructions. If you have problems or questions, contact your health care provider. What can I expect after the procedure? After the procedure, it is common to have:  A sore throat.  Mild stomach pain or discomfort.  Bloating.  Nausea. Follow these instructions at home:  Follow instructions from your health care provider about what to eat or drink after your procedure.  Return to your normal activities as told by your health care provider. Ask your health care provider what activities are safe for you.  Take over-the-counter and prescription medicines only as told by your health care  provider.  If you were given a sedative during the procedure, it can affect you for several hours. Do not drive or operate machinery until your health care provider says that it is safe.  Keep all follow-up visits as told by your health care provider. This is important.   Contact a health care provider if you have:  A sore throat that lasts longer than one day.  Trouble swallowing. Get help right away if:  You vomit blood or your vomit looks like coffee grounds.  You have: ? A fever. ? Bloody, black, or tarry stools. ? A severe sore throat or you cannot swallow. ? Difficulty breathing. ? Severe pain in your chest or abdomen. Summary  After the procedure, it is common to have a sore throat, mild stomach discomfort, bloating, and nausea.  If you were given a sedative during the procedure, it can affect you for several hours. Do not drive or operate machinery until your health care provider says that it is safe.  Follow instructions from your health care provider about what to eat or drink after your procedure.  Return to your normal activities as told by your health care provider. This information is not intended to replace advice given to you by your health care provider. Make sure you discuss any questions you have with your health care provider. Document Revised: 03/23/2019 Document Reviewed: 08/25/2017 Elsevier Patient Education  2021 [place].     Stomach Polyps A stomach polyp, also called a gastric polyp, is a growth on the lining of the stomach. A stomach polyp may be found by chance when you are being examined for another reason. Most polyps are not dangerous, but some can be harmful because of their size, location, or type. Polyps that can become harmful include:  Large polyps. These can turn into open sores called ulcers. Ulcers can lead to stomach bleeding.  Gastric outlet obstructions. These polyps block food from moving from the stomach to the small  intestine.  Adenomas. These polyps can become cancerous. What are the causes? Stomach polyps form when the lining of the stomach gets inflamed or damaged. Stomach inflammation and damage may be caused by:  A long-lasting stomach condition, such as gastritis.  Taking certain medicines to reduce stomach acid over a long period of time.  An inherited condition called familial adenomatous polyposis.  An infection caused by a type of bacteria called Helicobacter pylori, or H. pylori. What are the signs or symptoms? Usually, this condition does not cause any symptoms. If you do have symptoms, they may include:  Pain or tenderness in the abdomen.  Nausea.  Blood in the stool.  Anemia.  Trouble eating or swallowing. How is this diagnosed? This condition may be diagnosed with:  Your medical history.  A medical procedure called an upper endoscopy. This procedure uses a flexible tube with a tiny camera on the end to look inside your stomach.  A lab test in which a part of the polyp is examined. This test is done by removing a polyp tissue sample during an endoscopy to look at it under a microscope (biopsy). How is this treated?  Treatment depends on the type, location, and size of the polyps. Treatment may include:  Checking the polyps regularly with an endoscopy.  Removing the polyps with an endoscopy procedure. This may be done if the polyps are harmful or can become harmful. Removing a polyp often prevents problems from developing.  Removing the polyps with a surgery called a partial gastrectomy. This may be done in rare cases to remove very large polyps.  Treating the underlying condition that caused the polyps. Follow these instructions at home:  Take over-the-counter and prescription medicines only as told by your health care provider.  Avoid foods and drinks that make your symptoms worse.  Make sure that all the food you eat is properly cooked and stored. This will help  prevent bacterial infection, such as H. pylori infection.  Do not drink alcohol if your health care provider tells you not to drink.  Do not use any products that contain nicotine or tobacco, such as cigarettes, e-cigarettes, and chewing tobacco. If you need help quitting, ask your health care provider.  Keep all follow-up visits as told by your health care provider. This is important.   Contact a health care provider if:  You develop new symptoms such as: ? Loss of appetite. ? Feeling full after eating a small meal. ? Vomiting. ? Losing weight without trying. ? Fatigue.  Your symptoms get worse.  You have questions about the tests or procedures you may need. Get help right away if:  You vomit blood.  You have severe abdominal pain.  You cannot eat or drink.  You have blood in your stool. Summary  A stomach polyp, also called a gastric polyp, is a growth on the lining of the stomach. Most polyps are not dangerous, but some can be harmful because of their size, location, or type.  Usually, this condition does not cause any symptoms. If you do have symptoms, you may have abdominal pain, nausea, trouble eating or swallowing, or blood in your stool.  This condition may be diagnosed with a medical procedure called an upper endoscopy. A lab test in which a part of the polyp is examined may also be done. This test is done by removing a polyp tissue sample during an endoscopy to look at it under a microscope (biopsy).  Keep all follow-up visits as told by your health care provider. This is important. This information is not intended to replace advice given to you by your health care provider. Make sure you discuss any questions you have with your health care provider. Document Revised: 04/30/2019 Document Reviewed: 04/30/2019 Elsevier Patient Education  2021 [employer].

## 2020-05-23 NOTE — H&P (Signed)
Primary Care Physician:  Kathyrn Drown, MD Primary Gastroenterologist:  Dr. Abbey Chatters  Pre-Procedure History & Physical: HPI:  Deborah Carroll is a 65 y.o. female is here for and EGD for GERD, early satiety, nausea, and weight loss. Overall weight down about 20 pounds in the past year unintentionally. Appetite remains somewhat poor.  She does not typically eat breakfast.  After eating lunch she does not get hungry for the rest of the day.  She continues to have some postprandial nausea but no vomiting.  Denies any heartburn on pantoprazole.  Denies any NSAID or aspirin use.   Past Medical History:  Diagnosis Date  . Anxiety   . Chronic pain   . Depression   . Diabetes mellitus without complication ([place])   . GERD (gastroesophageal reflux disease)   . Hypertension   . Morbid obesity ([place]) 03/20/2018  . Neuropathy   . Peripheral neuropathy   . Peripheral neuropathy   . Seizures ([place])    last one 1992- Increased BP with delivery and is on meds still.  . Sleep apnea    CPAP x 15 years    Past Surgical History:  Procedure Laterality Date  . ABDOMINAL HYSTERECTOMY     ovaries present, no cancer,approx 2001  . BACK SURGERY    . CARPAL TUNNEL RELEASE     Bilateral  . CATARACT EXTRACTION W/PHACO Right 06/20/2015   Procedure: CATARACT EXTRACTION PHACO AND INTRAOCULAR LENS PLACEMENT (IOC);  Surgeon: Rutherford Guys, MD;  Location: [facility];  Service: Ophthalmology;  Laterality: Right;  CDE:6.25  . CATARACT EXTRACTION W/PHACO Left 08/29/2015   Procedure: CATARACT EXTRACTION PHACO AND INTRAOCULAR LENS PLACEMENT (IOC);  Surgeon: Rutherford Guys, MD;  Location: [facility];  Service: Ophthalmology;  Laterality: Left;  CDE: 4.11  . CESAREAN SECTION     x2  . CHOLECYSTECTOMY    . COLONOSCOPY  10/14/2002   [id number] involving descending colon, splenic flexure and distal transverse colon with most extensive inflammation in the splenic flexure area.  These changes are typical of ischemic colitis.  Biopsy  taken.  . COLONOSCOPY N/A 08/04/2012   SLF: POLYPOID LESION IN AT THE APPENDICEAL ORIFICE/Polyp  in the descending colon/Moderate melanosis throughout the entire examined colon/ The colon IS redundant. small tubular adenoma.   . COLONOSCOPY WITH PROPOFOL N/A 11/17/2018   3 tubular adenomas removed, 3 hyperplastic polyps, next colonoscopy in 11/2021.   Marland Kitchen FOOT SURGERY Bilateral 2004   hammer toe repairs  . PARS PLANA VITRECTOMY W/ REPAIR OF MACULAR HOLE    . POLYPECTOMY  11/17/2018   Procedure: POLYPECTOMY;  Surgeon: Danie Binder, MD;  Location: [facility];  Service: Endoscopy;;    Prior to Admission medications   Medication Sig Start Date End Date Taking? Authorizing Provider  ALPRAZolam Duanne Moron) 0.5 MG tablet 1/2 tablet to a full tablet for severe anxiousness use sparingly not with hydrocodone 04/27/20  Yes Luking, Elayne Snare, MD  APPLE CIDER VINEGAR PO Take 1 tablet by mouth daily.   Yes [provider]  ascorbic acid (VITAMIN C) 500 MG tablet Take 500 mg by mouth daily.   Yes [provider]  cholecalciferol (VITAMIN D3) 25 MCG (1000 UNIT) tablet Take 1,000 Units by mouth daily.   Yes [provider]  Cinnamon 500 MG capsule Take 500 mg by mouth in the morning and at bedtime.   Yes [provider]  dicyclomine (BENTYL) 10 MG capsule TAKE 1 CAPSULE BY MOUTH EVERY MORNING FOR DIARRHEA PREVENTION. MAY TAKE UP TO  3 TIMES A DAY FOR ABDOMINAL CRAMPS AND LOOSE STOOL 05/15/20  Yes Annitta Needs, NP  hydrochlorothiazide (HYDRODIURIL) 25 MG tablet TAKE 1 TABLET(25 MG) BY MOUTH DAILY Patient taking differently: Take 25 mg by mouth daily. TAKE 1 TABLET(25 MG) BY MOUTH DAILY 02/01/20  Yes Kathyrn Drown, MD  HYDROcodone-acetaminophen (NORCO) 10-325 MG tablet 1 taken 3 times daily as needed for pain 05/02/20  Yes Luking, Scott A, MD  metFORMIN (GLUCOPHAGE) 500 MG tablet TAKE 1 TABLET BY MOUTH DAILY 05/10/20  Yes Kathyrn Drown, MD  metoprolol tartrate (LOPRESSOR) 50 MG  tablet Take one tablet by mouth two hours prior to your CT procedure. 04/14/20  Yes Jettie Booze, MD  pantoprazole (PROTONIX) 40 MG tablet TAKE 1 TABLET(40 MG) BY MOUTH DAILY Patient taking differently: Take 40 mg by mouth daily. TAKE 1 TABLET(40 MG) BY MOUTH DAILY 02/01/20  Yes Luking, Elayne Snare, MD  phenytoin (DILANTIN) 100 MG ER capsule TAKE 2 CAPSULES BY MOUTH EVERY MORNING AND 2 CAPSULE BY MOUTH EVERY EVENING Patient taking differently: Take 200 mg by mouth 2 (two) times daily. TAKE 2 CAPSULES BY MOUTH EVERY MORNING AND 2 CAPSULE BY MOUTH EVERY EVENING 03/14/20  Yes Luking, Scott A, MD  potassium chloride SA (KLOR-CON) 20 MEQ tablet Take 20 mEq by mouth 2 (two) times daily.   Yes [provider]  rosuvastatin (CRESTOR) 5 MG tablet TAKE 1 TABLET(5 MG) BY MOUTH DAILY Patient taking differently: Take 5 mg by mouth daily. TAKE 1 TABLET(5 MG) BY MOUTH DAILY 02/01/20  Yes Kathyrn Drown, MD  sertraline (ZOLOFT) 100 MG tablet 1 and 1/2 tablet daily 04/27/20  Yes Luking, Elayne Snare, MD  torsemide (DEMADEX) 20 MG tablet Take 1 tablet (20 mg total) by mouth daily as needed (fluid or swelling). 09/10/19  Yes Kathyrn Drown, MD    Allergies as of 03/06/2020  . (No Known Allergies)    Family History  Problem Relation Age of Onset  . Colon polyps Mother 57       benign   . Hypertension Maternal Grandmother   . Heart attack Maternal Grandfather   . Emphysema Father   . Asthma Father   . Colon cancer Neg Hx   . Liver disease Neg Hx     Social History   Socioeconomic History  . Marital status: Married    Spouse name: Not on file  . Number of children: 2  . Years of education: 2  . Highest education level: Not on file  Occupational History  . Occupation: disability  Tobacco Use  . Smoking status: Never Smoker  . Smokeless tobacco: Never Used  . Tobacco comment: smoked x 4 years many years ago  Substance and Sexual Activity  . Alcohol use: Not Currently  . Drug use: No  .  Sexual activity: Yes    Birth control/protection: Surgical  Other Topics Concern  . Not on file  Social History Narrative   lives with husband   Social Determinants of Health   Financial Resource Strain: Not on file  Food Insecurity: Not on file  Transportation Needs: Not on file  Physical Activity: Not on file  Stress: Not on file  Social Connections: Not on file  Intimate Partner Violence: Not on file    Review of Systems: See HPI, otherwise negative ROS  Impression/Plan: Deborah Carroll is here for and EGD for GERD, early satiety, nausea, and weight loss.   The risks of the procedure including infection, bleed, or  perforation as well as benefits, limitations, alternatives and imponderables have been reviewed with the patient. Questions have been answered. All parties agreeable.

## 2020-05-24 LAB — SURGICAL PATHOLOGY

## 2020-05-25 ENCOUNTER — Telehealth: Payer: Self-pay | Admitting: Internal Medicine

## 2020-05-25 NOTE — Telephone Encounter (Signed)
Pt had procedure by Dr Abbey Chatters on 05/23/2020 and said that Dr Abbey Chatters was going to put her on a medication, but her pharmacy hasn't received anything. Please advise. [phone]

## 2020-05-25 NOTE — Telephone Encounter (Signed)
Phoned the pt back and was advised that after her procedure Dr. Abbey Chatters advised her he would call in a Rx for her or she needed to double up on a medication, she couldn't remember which one and I looked @  the note I didn't see what she was talking about. I advised the pt he is not in the office today and tomorrow but will be here Monday.  Pt is fine waiting until Monday.

## 2020-05-26 ENCOUNTER — Encounter (HOSPITAL_COMMUNITY): Payer: Self-pay | Admitting: Internal Medicine

## 2020-05-30 ENCOUNTER — Telehealth: Payer: Self-pay | Admitting: Internal Medicine

## 2020-05-30 MED ORDER — PANTOPRAZOLE SODIUM 40 MG PO TBEC: 40.0000 mg | DELAYED_RELEASE_TABLET | Freq: Two times a day (BID) | ORAL | 1 refills | Status: DC

## 2020-05-30 NOTE — Telephone Encounter (Signed)
Prescription sent to pharmacy.  Please change patient's follow-up visit to with me in the next 6 weeks.  Thank you

## 2020-05-30 NOTE — Telephone Encounter (Signed)
Called patient and changed appointment to with you

## 2020-05-30 NOTE — Telephone Encounter (Signed)
noted 

## 2020-05-31 ENCOUNTER — Other Ambulatory Visit: Payer: Self-pay | Admitting: Family Medicine

## 2020-06-01 ENCOUNTER — Telehealth: Payer: Self-pay

## 2020-06-01 NOTE — Telephone Encounter (Signed)
LVM for pt to call me back to schedule sleep study  

## 2020-06-21 ENCOUNTER — Telehealth: Payer: Self-pay | Admitting: Internal Medicine

## 2020-06-21 DIAGNOSIS — Q858 Other phakomatoses, not elsewhere classified: Secondary | ICD-10-CM

## 2020-06-21 DIAGNOSIS — K317 Polyp of stomach and duodenum: Secondary | ICD-10-CM

## 2020-06-21 DIAGNOSIS — Q8589 Other phakomatoses, not elsewhere classified: Secondary | ICD-10-CM

## 2020-06-21 NOTE — Telephone Encounter (Signed)
STK 11 testing ordered

## 2020-06-22 ENCOUNTER — Ambulatory Visit (INDEPENDENT_AMBULATORY_CARE_PROVIDER_SITE_OTHER): Payer: PPO | Admitting: Neurology

## 2020-06-22 ENCOUNTER — Other Ambulatory Visit: Payer: Self-pay

## 2020-06-22 DIAGNOSIS — G4731 Primary central sleep apnea: Secondary | ICD-10-CM

## 2020-06-22 DIAGNOSIS — G4733 Obstructive sleep apnea (adult) (pediatric): Secondary | ICD-10-CM

## 2020-06-22 DIAGNOSIS — R634 Abnormal weight loss: Secondary | ICD-10-CM

## 2020-06-22 DIAGNOSIS — G4720 Circadian rhythm sleep disorder, unspecified type: Secondary | ICD-10-CM

## 2020-06-22 DIAGNOSIS — G4761 Periodic limb movement disorder: Secondary | ICD-10-CM

## 2020-06-22 DIAGNOSIS — R519 Headache, unspecified: Secondary | ICD-10-CM

## 2020-06-27 ENCOUNTER — Telehealth: Payer: Self-pay | Admitting: Family Medicine

## 2020-06-27 ENCOUNTER — Encounter: Payer: Self-pay | Admitting: Family Medicine

## 2020-06-27 NOTE — Telephone Encounter (Signed)
Pt contacted and verbalized understanding.  

## 2020-06-27 NOTE — Telephone Encounter (Signed)
Patient has physical on 3/29 and needing labs

## 2020-06-27 NOTE — Telephone Encounter (Signed)
Last labs 04/27/20: Lipid, Dilantin, CMP, Urine Microprotien, HgbA1c

## 2020-06-27 NOTE — Telephone Encounter (Signed)
Please do all the labs which were ordered on 04/27/2020(it appears that they are still active) it would be wise for the patient to specifically requested these labs so that [facility] does not accidentally draw different lab

## 2020-06-28 DIAGNOSIS — L918 Other hypertrophic disorders of the skin: Secondary | ICD-10-CM | POA: Diagnosis not present

## 2020-06-28 DIAGNOSIS — D1801 Hemangioma of skin and subcutaneous tissue: Secondary | ICD-10-CM | POA: Diagnosis not present

## 2020-06-28 DIAGNOSIS — L821 Other seborrheic keratosis: Secondary | ICD-10-CM | POA: Diagnosis not present

## 2020-06-28 DIAGNOSIS — L820 Inflamed seborrheic keratosis: Secondary | ICD-10-CM | POA: Diagnosis not present

## 2020-07-04 ENCOUNTER — Encounter: Payer: Self-pay | Admitting: Family Medicine

## 2020-07-04 ENCOUNTER — Other Ambulatory Visit: Payer: Self-pay

## 2020-07-04 ENCOUNTER — Ambulatory Visit (INDEPENDENT_AMBULATORY_CARE_PROVIDER_SITE_OTHER): Payer: PPO | Admitting: Family Medicine

## 2020-07-04 VITALS — BP 120/74 | HR 73 | Temp 97.6°F | Ht 62.0 in | Wt 216.0 lb

## 2020-07-04 DIAGNOSIS — E119 Type 2 diabetes mellitus without complications: Secondary | ICD-10-CM

## 2020-07-04 DIAGNOSIS — E785 Hyperlipidemia, unspecified: Secondary | ICD-10-CM

## 2020-07-04 DIAGNOSIS — R194 Change in bowel habit: Secondary | ICD-10-CM

## 2020-07-04 DIAGNOSIS — Z0000 Encounter for general adult medical examination without abnormal findings: Secondary | ICD-10-CM

## 2020-07-04 DIAGNOSIS — E1169 Type 2 diabetes mellitus with other specified complication: Secondary | ICD-10-CM

## 2020-07-04 NOTE — Progress Notes (Signed)
Patient ID: Deborah Carroll, female    DOB: May 13, 1955, 65 y.o.   MRN: [id number]   Chief Complaint  Patient presents with  . Annual Exam    With pap    Subjective:  CC: annual physical with pelvic  Presents today for annual wellness.  Reports that she has been having some issues with her bowel movements, she saw GI on February 15 for an EGD.  She has been followed by GI for this problem.  Reports that she has been having soft and liquid stools.  Her last colonoscopy was August 2020, polyps removed at that time.  She sees cardiology and GI.  She sees Dr. Sallee Lange, her PCP for chronic pain management.  She has not done her labs for this visit, will get today.    Medical History Deborah Carroll has a past medical history of Anxiety, Chronic pain, Depression, Diabetes mellitus without complication ([place]), GERD (gastroesophageal reflux disease), Hypertension, Morbid obesity ([place]) (03/20/2018), Neuropathy, Peripheral neuropathy, Peripheral neuropathy, Seizures ([place]), and Sleep apnea.   Outpatient Encounter Medications as of 07/04/2020  Medication Sig  . ALPRAZolam (XANAX) 0.5 MG tablet 1/2 tablet to a full tablet for severe anxiousness use sparingly not with hydrocodone  . APPLE CIDER VINEGAR PO Take 1 tablet by mouth daily.  Marland Kitchen ascorbic acid (VITAMIN C) 500 MG tablet Take 500 mg by mouth daily.  . cholecalciferol (VITAMIN D3) 25 MCG (1000 UNIT) tablet Take 1,000 Units by mouth daily.  . Cinnamon 500 MG capsule Take 500 mg by mouth in the morning and at bedtime.  . dicyclomine (BENTYL) 10 MG capsule TAKE 1 CAPSULE BY MOUTH EVERY MORNING FOR DIARRHEA PREVENTION. MAY TAKE UP TO 3 TIMES A DAY FOR ABDOMINAL CRAMPS AND LOOSE STOOL  . hydrochlorothiazide (HYDRODIURIL) 25 MG tablet TAKE 1 TABLET(25 MG) BY MOUTH DAILY (Patient taking differently: Take 25 mg by mouth daily. TAKE 1 TABLET(25 MG) BY MOUTH DAILY)  . HYDROcodone-acetaminophen (NORCO) 10-325 MG tablet 1 taken 3 times daily as needed for pain  .  metFORMIN (GLUCOPHAGE) 500 MG tablet TAKE 1 TABLET BY MOUTH DAILY  . metoprolol tartrate (LOPRESSOR) 50 MG tablet Take one tablet by mouth two hours prior to your CT procedure.  . pantoprazole (PROTONIX) 40 MG tablet Take 1 tablet (40 mg total) by mouth 2 (two) times daily. TAKE 1 TABLET(40 MG) BY MOUTH DAILY  . phenytoin (DILANTIN) 100 MG ER capsule TAKE 2 CAPSULES BY MOUTH EVERY MORNING AND 2 CAPSULE BY MOUTH EVERY EVENING (Patient taking differently: Take 200 mg by mouth 2 (two) times daily. TAKE 2 CAPSULES BY MOUTH EVERY MORNING AND 2 CAPSULE BY MOUTH EVERY EVENING)  . potassium chloride SA (KLOR-CON) 20 MEQ tablet Take 20 mEq by mouth 2 (two) times daily.  . rosuvastatin (CRESTOR) 5 MG tablet TAKE 1 TABLET(5 MG) BY MOUTH DAILY (Patient taking differently: Take 5 mg by mouth daily. TAKE 1 TABLET(5 MG) BY MOUTH DAILY)  . sertraline (ZOLOFT) 100 MG tablet 1 and 1/2 tablet daily  . torsemide (DEMADEX) 20 MG tablet Take 1 tablet (20 mg total) by mouth daily as needed (fluid or swelling).   No facility-administered encounter medications on file as of 07/04/2020.     Review of Systems  Constitutional: Negative for chills and fever.  Gastrointestinal: Positive for abdominal distention, abdominal pain, diarrhea and nausea.  Genitourinary: Negative for dysuria, vaginal bleeding and vaginal discharge.  Musculoskeletal: Positive for back pain.       Chronic pain on opioids per Dr. Wolfgang Phoenix  Neurological: Negative for dizziness, light-headedness and headaches.     Vitals BP 120/74   Pulse 73   Temp 97.6 F (36.4 C)   Ht 5\' 2"  (1.575 m)   Wt 216 lb (98 kg)   SpO2 99%   BMI 39.51 kg/m   Objective:   Physical Exam Vitals reviewed.  Constitutional:      Appearance: Normal appearance.  Cardiovascular:     Rate and Rhythm: Normal rate and regular rhythm.     Heart sounds: Normal heart sounds.  Pulmonary:     Effort: Pulmonary effort is normal.     Breath sounds: Normal breath sounds.   Chest:  Breasts:     Right: Normal. No nipple discharge, skin change or axillary adenopathy.     Left: Normal. No nipple discharge, skin change or axillary adenopathy.    Abdominal:     General: Bowel sounds are normal.     Tenderness: There is no abdominal tenderness.  Genitourinary:    General: Normal vulva.     Comments: Hysterectomy. External GU: no rashes or lesions. Vagina: no discharge; tissue pink in color. . Bimanual exam: no tenderness or obvious masses.  Lymphadenopathy:     Upper Body:     Right upper body: No axillary adenopathy.     Left upper body: No axillary adenopathy.  Skin:    General: Skin is warm and dry.  Neurological:     General: No focal deficit present.     Mental Status: She is alert.  Psychiatric:        Behavior: Behavior normal.      Assessment and Plan   1. Wellness examination  2. Diabetes mellitus without complication ([facility]) - Comprehensive metabolic panel - Hemoglobin A1c - Amylase - Lipase - Urine Microalbumin w/creat. ratio  3. Hyperlipidemia associated with type 2 diabetes mellitus ([place]) - Lipid panel  4. Bowel habit changes - Comprehensive metabolic panel - Amylase - Lipase   Concerns: loose stools, GI for this, had EDG on 05/23/20, has lost weight without trying  Chronic conditions: HLD, pre-diabetes, depression,lower extremity edema.  Medication compliance: Well-controlled (no chest pain, no shortness of breath, no leg swelling, blood sugars controlled): Medication management interval: Monitors blood pressure: Monitors blood sugars: Refills requested today:  Specialists: cardiology, GI    Health Maintenance:  Mammogram: less than one year ago.  Colonoscopy: 11/2018 Pap smear: Reports history of hysterectomy greater than 20 years ago, no Pap smear will be done today, just pelvic exam.  Lab review: LDL: no recent labs, will get today.  Triglycerides: A1C: Renal: Hepatic: TSH:   Lifestyle:   Diet and  exercise: walks daily, tries to eat healthy.  Alcohol consumption: none Smoking cessation: non smiker Sexually active: yes, one female partner  Functional assessment: no limitations.  Vision: sees eye doctor yearly Dental: not recently, needs to find another dentist.    Safety measures recommended: seat belt use while in vehicle, safe sexual practices, no illicit drug use, no excessive alcohol use. Diet and exercise/ lifestyle modifications discussed. Recommend 150 minutes per week of exercise such as walking. Recommend lots of fresh produce to include fruits, vegetables, beans, healthy fats such as avocado, nuts, seeds, and 3-6 ounces of protein at each meal.  Avoid fried foods, fast food, and processed carbohydrates. Limit alcohol consumption: no more than one drink per day for women and 2 drinks per day for men. Alcohol use is not appropriate with certain medications.  Stress management discussed. Questions answered.  Agrees with plan of care discussed today. Understands warning signs to seek further care: chest pain, shortness of breath, any significant change in health.  Understands to follow-up in 1 year for annual wellness, sooner with Dr. Sallee Lange for pain management.  We will get lab work done today, will notify once results are available, adjustments of medications made if necessary.     Chalmers Guest, NP 07/04/2020

## 2020-07-04 NOTE — Patient Instructions (Signed)
Preventive Care 64-64 Years Old, Female Preventive care refers to lifestyle choices and visits with your health care provider that can promote health and wellness. This includes:  A yearly physical exam. This is also called an annual wellness visit.  Regular dental and eye exams.  Immunizations.  Screening for certain conditions.  Healthy lifestyle choices, such as: ? Eating a healthy diet. ? Getting regular exercise. ? Not using drugs or products that contain nicotine and tobacco. ? Limiting alcohol use. What can I expect for my preventive care visit? Physical exam Your health care provider will check your:  Height and weight. These may be used to calculate your BMI (body mass index). BMI is a measurement that tells if you are at a healthy weight.  Heart rate and blood pressure.  Body temperature.  Skin for abnormal spots. Counseling Your health care provider may ask you questions about your:  Past medical problems.  Family's medical history.  Alcohol, tobacco, and drug use.  Emotional well-being.  Home life and relationship well-being.  Sexual activity.  Diet, exercise, and sleep habits.  Work and work Statistician.  Access to firearms.  Method of birth control.  Menstrual cycle.  Pregnancy history. What immunizations do I need? Vaccines are usually given at various ages, according to a schedule. Your health care provider will recommend vaccines for you based on your age, medical history, and lifestyle or other factors, such as travel or where you work.   What tests do I need? Blood tests  Lipid and cholesterol levels. These may be checked every 5 years, or more often if you are over 64 years old.  Hepatitis C test.  Hepatitis B test. Screening  Lung cancer screening. You may have this screening every year starting at age 64 if you have a 30-pack-year history of smoking and currently smoke or have quit within the past 15 years.  Colorectal cancer  screening. ? All adults should have this screening starting at age 64 and continuing until age 17. ? Your health care provider may recommend screening at age 64 if you are at increased risk. ? You will have tests every 1-10 years, depending on your results and the type of screening test.  Diabetes screening. ? This is done by checking your blood sugar (glucose) after you have not eaten for a while (fasting). ? You may have this done every 1-3 years.  Mammogram. ? This may be done every 1-2 years. ? Talk with your health care provider about when you should start having regular mammograms. This may depend on whether you have a family history of breast cancer.  BRCA-related cancer screening. This may be done if you have a family history of breast, ovarian, tubal, or peritoneal cancers.  Pelvic exam and Pap test. ? This may be done every 3 years starting at age 10. ? Starting at age 11, this may be done every 5 years if you have a Pap test in combination with an HPV test. Other tests  STD (sexually transmitted disease) testing, if you are at risk.  Bone density scan. This is done to screen for osteoporosis. You may have this scan if you are at high risk for osteoporosis. Talk with your health care provider about your test results, treatment options, and if necessary, the need for more tests. Follow these instructions at home: Eating and drinking  Eat a diet that includes fresh fruits and vegetables, whole grains, lean protein, and low-fat dairy products.  Take vitamin and mineral supplements  as recommended by your health care provider.  Do not drink alcohol if: ? Your health care provider tells you not to drink. ? You are pregnant, may be pregnant, or are planning to become pregnant.  If you drink alcohol: ? Limit how much you have to 0-1 drink a day. ? Be aware of how much alcohol is in your drink. In the [place] one drink equals one 12 oz bottle of beer (355 mL), one 5 oz glass of  wine (148 mL), or one 1 oz glass of hard liquor (44 mL).   Lifestyle  Take daily care of your teeth and gums. Brush your teeth every morning and night with fluoride toothpaste. Floss one time each day.  Stay active. Exercise for at least 30 minutes 5 or more days each week.  Do not use any products that contain nicotine or tobacco, such as cigarettes, e-cigarettes, and chewing tobacco. If you need help quitting, ask your health care provider.  Do not use drugs.  If you are sexually active, practice safe sex. Use a condom or other form of protection to prevent STIs (sexually transmitted infections).  If you do not wish to become pregnant, use a form of birth control. If you plan to become pregnant, see your health care provider for a prepregnancy visit.  If told by your health care provider, take low-dose aspirin daily starting at age 40.  Find healthy ways to cope with stress, such as: ? Meditation, yoga, or listening to music. ? Journaling. ? Talking to a trusted person. ? Spending time with friends and family. Safety  Always wear your seat belt while driving or riding in a vehicle.  Do not drive: ? If you have been drinking alcohol. Do not ride with someone who has been drinking. ? When you are tired or distracted. ? While texting.  Wear a helmet and other protective equipment during sports activities.  If you have firearms in your house, make sure you follow all gun safety procedures. What's next?  Visit your health care provider once a year for an annual wellness visit.  Ask your health care provider how often you should have your eyes and teeth checked.  Stay up to date on all vaccines. This information is not intended to replace advice given to you by your health care provider. Make sure you discuss any questions you have with your health care provider. Document Revised: 12/28/2019 Document Reviewed: 12/04/2017 Elsevier Patient Education  2021 [employer].

## 2020-07-07 NOTE — Procedures (Signed)
PATIENT'S NAME:  Deborah Carroll, Deborah Carroll DOB:      [birth year]      MR#:    [id number]     DATE OF RECORDING: 06/22/2020 REFERRING M.D.:  Andrey Spearman, MD Study Performed:  Split-Night Titration Study HISTORY: 65 year old woman with a history of seizure disorder, peripheral neuropathy, hypertension, anxiety, depression, chronic pain, and obesity, who was diagnosed with obstructive sleep apnea many years ago. She has been on CPAP therapy for years. She has an older machine, [employer] plus, pressure at 11 cm. She patient endorsed the Epworth Sleepiness Scale at 4 points. The patient's weight 212 pounds with a height of 62 (inches), resulting in a BMI of 38.9 kg/m2. The patient's neck circumference measured 17.1 inches.  CURRENT MEDICATIONS: Xanax, Vitamin C, Vitamin D3, Cinnamon, Bentyl, Hydroduiril, Norco, Gucophage, Lopressor, Protonix, Dilantin, Klor-Con, Crestor, Zoloft, Demadex, Apple cider vinegar  PROCEDURE:  This is a multichannel digital polysomnogram utilizing the Somnostar 11.2 system.  Electrodes and sensors were applied and monitored per AASM Specifications.   EEG, EOG, Chin and Limb EMG, were sampled at 200 Hz.  ECG, Snore and Nasal Pressure, Thermal Airflow, Respiratory Effort, CPAP Flow and Pressure, Oximetry was sampled at 50 Hz. Digital video and audio were recorded.      BASELINE STUDY WITHOUT CPAP RESULTS:  Lights Out was at 22:12 and Lights On at 05:00 for the night, split start at 01:21, epoch 385. Total recording time (TRT) was 191, with a total sleep time (TST) of 48.5 minutes.   The patient's sleep latency was 111 minutes, which is markedly delayed. REM sleep was absent. The sleep efficiency was 25.4 %.    SLEEP ARCHITECTURE: WASO (Wake after sleep onset) was 26 minutes, Stage N1 was 9.5 minutes, Stage N2 was 39 minutes, Stage N3 was 0 minutes and Stage R (REM sleep) was 0 minutes.  The percentages were Stage N1 19.6%, Stage N2 80.4%, both markedly increased, Stage N3 and Stage  R (REM sleep) were absent prior to PAP initiation. The arousals were noted as: 21 were spontaneous, 2 were associated with PLMs, 65 were associated with respiratory events.  RESPIRATORY ANALYSIS:  There were a total of 84 respiratory events:  13 obstructive apneas, 69 central apneas and 0 mixed apneas with a total of 82 apneas and an apnea index (AI) of 101.4. There were 2 hypopneas with a hypopnea index of 2.5. The patient also had 0 respiratory event related arousals (RERAs).  Snoring was noted.     The total APNEA/HYPOPNEA INDEX (AHI) was 103.9 /hour and the total RESPIRATORY DISTURBANCE INDEX was 103.9 /hour.  0 events occurred in REM sleep and 73 events in NREM. The REM AHI was 0, /hour versus a non-REM AHI of 103.9 /hour. The patient spent 0.5 minutes sleep time in the supine position 233 minutes in non-supine. The supine AHI was 0.0 /hour versus a non-supine AHI of 103.9 /hour.  OXYGEN SATURATION & C02:  The wake baseline 02 saturation was 94%, with the lowest being 86%. Time spent below 89% saturation equaled 2 minutes.  PERIODIC LIMB MOVEMENTS: The patient had a total of 12 Periodic Limb Movements.  The Periodic Limb Movement (PLM) index was 14.8 /hour and the PLM Arousal index was 2.5 /hour.  Audio and video analysis did not show any abnormal or unusual movements, behaviors, phonations or vocalizations. The patient took 1 bathroom break. Mild to moderate snoring was noted. The EKG was in keeping with normal sinus rhythm (NSR).   TITRATION STUDY WITH CPAP RESULTS:  The patient was fitted with a medium Vitera full facemask from [employer] and CPAP was initiated CPAP was initiated at 5 cmH20 with heated humidity per AASM split night standards.  Her pressure was gradually titrated gradually to a final pressure of 9 cm, at which time her AHI was 0/h, O2 nadir 92%, with nonsupine REM sleep achieved.    Total recording time (TRT) was 217 minutes, with a total sleep time (TST) of 185 minutes.  The patient's sleep latency was 15 minutes. REM latency was 90 minutes.  The sleep efficiency was 85.3 %.    SLEEP ARCHITECTURE: Wake after sleep was 20.5 minutes, Stage N1 8 minutes, Stage N2 133.5 minutes, Stage N3 10.5 minutes and Stage R (REM sleep) 33 minutes. The percentages were: Stage N1 4.3%, Stage N2 72.2%, Stage N3 5.7% and Stage R (REM sleep) 17.8%. The arousals were noted as: 24 were spontaneous, 0 were associated with PLMs, 2 were associated with respiratory events.  RESPIRATORY ANALYSIS:  There were a total of 4 respiratory events: 1 obstructive apneas, 0 central apneas and 0 mixed apneas with a total of 1 apneas and an apnea index (AI) of .3. There were 3 hypopneas with a hypopnea index of 1. /hour. The patient also had 0 respiratory event related arousals (RERAs).      The total APNEA/HYPOPNEA INDEX  (AHI) was 1.3 /hour and the total RESPIRATORY DISTURBANCE INDEX was 1.3 /hour.  0 events occurred in REM sleep and 4 events in NREM. The REM AHI was 0 /hour versus a non-REM AHI of 1.6 /hour. The patient spent 0% of total sleep time in the supine position. The supine AHI was 0.0 /hour, versus a non-supine AHI of 1.3/hour.  OXYGEN SATURATION & C02:  The wake baseline 02 saturation was 94%, with the lowest being 91%. Time spent below 89% saturation equaled 0 minutes.  PERIODIC LIMB MOVEMENTS: The patient had a total of 71 Periodic Limb Movements. The Periodic Limb Movement (PLM) index was 23. /hour and the PLM Arousal index was 0 /hour.  Post-study, the patient indicated that sleep was the same as usual.  POLYSOMNOGRAPHY IMPRESSION :   1. Primary Central Sleep Apnea   2. Obstructive Sleep Apnea (OSA)  3. Dysfunctions associated with sleep stages or arousals from sleep 4. Periodic Limb Movement Disorder (PLMD) RECOMMENDATIONS:  1. This patient has severe sleep disordered breathing with evidence of primary central apneas, likely - at least in part - owing to chronic narcotic pain  medication usage. Narcotic pain medication, especially in conjunction with benzodiazepine medication should be avoided close to bedtime. Total AHI prior to positive airway pressure treatment was 103.9/h.  The patient responded well to CPAP therapy, I will recommend new equipment with a treatment pressure of 9 cm using a medium full facemask. The patient will be advised to be fully compliant with PAP therapy to improve sleep related symptoms and decrease long term cardiovascular risks. Please note that untreated obstructive sleep apnea may carry additional perioperative morbidity. Patients with significant obstructive sleep apnea should receive perioperative PAP therapy and the surgeons and particularly the anesthesiologist should be informed of the diagnosis and the severity of the sleep disordered breathing. 2. This study shows sleep fragmentation and abnormal sleep stage percentages; these are nonspecific findings and per se do not signify an intrinsic sleep disorder or a cause for the patient's sleep-related symptoms. Causes include (but are not limited to) the first night effect of the sleep study, circadian rhythm disturbances, medication effect or an  underlying mood disorder or medical problem.  3. The patient should be cautioned not to drive, work at heights, or operate dangerous or heavy equipment when tired or sleepy. Review and reiteration of good sleep hygiene measures should be pursued with any patient. 4. Mild PLMs (periodic limb movements of sleep) were noted during this study without significant arousals; clinical correlation is recommended. Medication effect from the antidepressant medication should be considered.  5. The patient will be seen in follow-up in the sleep clinic at [facility] for discussion of the test results, symptom and treatment compliance review, further management strategies, etc. The referring provider will be notified of the test results.  I certify that I have reviewed the entire  raw data recording prior to the issuance of this report in accordance with the Standards of Accreditation of the American Academy of Sleep Medicine (AASM)  Star Age, MD, PhD Diplomat, American Board of Neurology and Sleep Medicine ( Neurology and Sleep Medicine)

## 2020-07-07 NOTE — Progress Notes (Signed)
Patient referred by Dr. Leta Baptist, seen by me on 05/16/20 for re-eval of her OSA, she had a split night sleep study on 06/22/20. Please call and notify patient that the recent sleep study showed severe sleep apnea, mostly due to central sleep apnea, which are at least in part, likely from her taking Norco. She should qualify for a new machine, I recommend a CPAP of 9 cm based on her sleep study. Please advise patient that we need a follow up appointment with either myself or one of our nurse practitioners in about 10 weeks post set-up to check for how the patient is feeling and how well the patient is using the machine, etc. Please go ahead and schedule the appointment, while you have the patient on the phone and make sure patient understands the importance of keeping this window for the FU appointment, as it is often an insurance requirement. Failing to adhere to this may result in losing coverage for sleep apnea treatment, at which point most patients are left with a choice of returning the machine or paying out of pocket (and we want neither of this to happen!).  Please re-enforce the importance of compliance with treatment and the need for [place] to monitor compliance data - again an insurance requirement and usually a good feedback for the patient as far as how they are doing.  Also remind patient, that any PAP machine or mask issues should be first addressed with the DME company, who provided the machine/mask.  Please ask if patient has a preference regarding DME company, may depend on the insurance too.  Please arrange for CPAP set up at home through a DME company of patient's choice.  Once you have spoken to the patient you can close the phone encounter. Please fax/route report to referring provider, thanks,   Star Age, MD, PhD [facility])

## 2020-07-07 NOTE — Addendum Note (Signed)
Addended by: Star Age on: 07/07/2020 12:40 PM   Modules accepted: Orders

## 2020-07-10 ENCOUNTER — Telehealth: Payer: Self-pay

## 2020-07-10 NOTE — Telephone Encounter (Signed)
-----   Message from Star Age, MD sent at 07/07/2020 12:40 PM EDT ----- Patient referred by Dr. Leta Baptist, seen by me on 05/16/20 for re-eval of her OSA, she had a split night sleep study on 06/22/20. Please call and notify patient that the recent sleep study showed severe sleep apnea, mostly due to central sleep apnea, which are at least in part, likely from her taking Norco. She should qualify for a new machine, I recommend a CPAP of 9 cm based on her sleep study. Please advise patient that we need a follow up appointment with either myself or one of our nurse practitioners in about 10 weeks post set-up to check for how the patient is feeling and how well the patient is using the machine, etc. Please go ahead and schedule the appointment, while you have the patient on the phone and make sure patient understands the importance of keeping this window for the FU appointment, as it is often an insurance requirement. Failing to adhere to this may result in losing coverage for sleep apnea treatment, at which point most patients are left with a choice of returning the machine or paying out of pocket (and we want neither of this to happen!).  Please re-enforce the importance of compliance with treatment and the need for [place] to monitor compliance data - again an insurance requirement and usually a good feedback for the patient as far as how they are doing.  Also remind patient, that any PAP machine or mask issues should be first addressed with the DME company, who provided the machine/mask.  Please ask if patient has a preference regarding DME company, may depend on the insurance too.  Please arrange for CPAP set up at home through a DME company of patient's choice.  Once you have spoken to the patient you can close the phone encounter. Please fax/route report to referring provider, thanks,   Star Age, MD, PhD [facility])

## 2020-07-10 NOTE — Telephone Encounter (Signed)
I called pt. I advised pt that Dr. Rexene Alberts reviewed their sleep study results and found that pt has severe osa, mostly csa likley from taking norco. Dr. Rexene Alberts recommends that pt start a new cpap at home. I reviewed PAP compliance expectations with the pt. Pt is agreeable to starting a CPAP. I advised pt that an order will be sent to a DME, [employer], and Huey Romans will call the pt within about one week after they file with the pt's insurance. Huey Romans will show the pt how to use the machine, fit for masks, and troubleshoot the CPAP if needed. A follow up appt was made for insurance purposes with Dr. Rexene Alberts on 11/01/2020 at 1:00pm. Pt verbalized understanding to arrive 15 minutes early and bring their CPAP. A letter with all of this information in it will be mailed to the pt as a reminder. I verified with the pt that the address we have on file is correct.  I advised patient of the national shortage of CPAP's.  I advised her that it could be 6 to 12 weeks before she receives her machine.  Huey Romans will be in touch with her to discuss this further. Pt verbalized understanding of results. Pt had no questions at this time but was encouraged to call back if questions arise. I have sent the order to [facility] and have received confirmation that they have received the order.

## 2020-07-12 ENCOUNTER — Ambulatory Visit: Payer: PPO | Admitting: Internal Medicine

## 2020-07-12 ENCOUNTER — Encounter: Payer: Self-pay | Admitting: Internal Medicine

## 2020-07-12 ENCOUNTER — Other Ambulatory Visit (HOSPITAL_COMMUNITY)
Admission: RE | Admit: 2020-07-12 | Discharge: 2020-07-12 | Disposition: A | Discharge status: Home / Self Care | Payer: PPO | Source: Ambulatory Visit | Attending: Internal Medicine | Admitting: Internal Medicine

## 2020-07-12 ENCOUNTER — Other Ambulatory Visit: Payer: Self-pay

## 2020-07-12 VITALS — BP 138/76 | HR 64 | Temp 97.3°F | Ht 62.0 in | Wt 217.8 lb

## 2020-07-12 DIAGNOSIS — E785 Hyperlipidemia, unspecified: Secondary | ICD-10-CM | POA: Diagnosis not present

## 2020-07-12 DIAGNOSIS — K219 Gastro-esophageal reflux disease without esophagitis: Secondary | ICD-10-CM

## 2020-07-12 DIAGNOSIS — K317 Polyp of stomach and duodenum: Secondary | ICD-10-CM

## 2020-07-12 DIAGNOSIS — R194 Change in bowel habit: Secondary | ICD-10-CM | POA: Diagnosis not present

## 2020-07-12 DIAGNOSIS — Q858 Other phakomatoses, not elsewhere classified: Secondary | ICD-10-CM | POA: Insufficient documentation

## 2020-07-12 DIAGNOSIS — E1169 Type 2 diabetes mellitus with other specified complication: Secondary | ICD-10-CM | POA: Diagnosis not present

## 2020-07-12 DIAGNOSIS — R569 Unspecified convulsions: Secondary | ICD-10-CM | POA: Diagnosis not present

## 2020-07-12 DIAGNOSIS — R634 Abnormal weight loss: Secondary | ICD-10-CM | POA: Diagnosis not present

## 2020-07-12 DIAGNOSIS — E119 Type 2 diabetes mellitus without complications: Secondary | ICD-10-CM | POA: Diagnosis not present

## 2020-07-12 DIAGNOSIS — R197 Diarrhea, unspecified: Secondary | ICD-10-CM | POA: Diagnosis not present

## 2020-07-12 NOTE — Patient Instructions (Signed)
Recommend increasing your dicyclomine to twice daily.  I would also take Imodium more aggressively as needed for diarrhea.  If this does not help, we will likely need to pursue colonoscopy to rule out microscopic colitis.  I will check genetic testing to rule out Peutz-Jeghers syndrome.  Follow-up in 6 weeks.  At [facility] we value your feedback. You may receive a survey about your visit today. Please share your experience as we strive to create trusting relationships with our patients to provide genuine, compassionate, quality care.  We appreciate your understanding and patience as we review any laboratory studies, imaging, and other diagnostic tests that are ordered as we care for you. Our office policy is 5 business days for review of these results, and any emergent or urgent results are addressed in a timely manner for your best interest. If you do not hear from our office in 1 week, please contact us.   We also encourage the use of MyChart, which contains your medical information for your review as well. If you are not enrolled in this feature, an access code is on this after visit summary for your convenience. Thank you for allowing [place] to be involved in your care.  It was great to see you today!  I hope you have a great rest of your spring!!    Elon Alas. Abbey Chatters, D.O. Gastroenterology and Hepatology [facility]

## 2020-07-12 NOTE — Progress Notes (Signed)
Referring Provider: Kathyrn Drown, MD Primary Care Physician:  Kathyrn Drown, MD Primary GI:  Dr. Abbey Chatters  Chief Complaint  Patient presents with  . Follow-up    F/U from EGD, diarrhea x 5 months, stomach pain, 3 days w/o bm    HPI:   Deborah Carroll is a 65 y.o. female who presents to clinic today for follow-up visit.  Deborah Carroll underwent EGD in February for GERD, early satiety, nausea, and weight loss.  Deborah Carroll was found to have numerous gastric polyps.  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  Patient denies any family history of polyposis symptoms.  No family history of extensive malignancies.  Last colonoscopy 11/17/2018 with 9 polyps removed, 8 of which were hyperplastic.  Her main complaint for me today is ongoing diarrhea.  States Deborah Carroll has had the symptoms for approximately 5 to 6 months.  Currently taking dicyclomine which helps some.  Takes Imodium for relief every 1 to 2 weeks and states this does stop her diarrhea.  No melena hematochezia.  CT abdomen and pelvis with contrast in July 2021 showed moderate colonic stool burden, fluid distended stomach but otherwise unremarkable.  Past Medical History:  Diagnosis Date  . Anxiety   . Chronic pain   . Depression   . Diabetes mellitus without complication ([place])   . GERD (gastroesophageal reflux disease)   . Hypertension   . Morbid obesity ([place]) 03/20/2018  . Neuropathy   . Peripheral neuropathy   . Peripheral neuropathy   . Seizures ([place])    last one 1992- Increased BP with delivery and is on meds still.  . Sleep apnea    CPAP x 15 years    Past Surgical History:  Procedure Laterality Date  . ABDOMINAL HYSTERECTOMY     ovaries present, no cancer,approx 2001  . BACK SURGERY    . BIOPSY  05/23/2020   Procedure: BIOPSY;  Surgeon: Eloise Harman, DO;  [facility]: [facility];  Service: Endoscopy;;  . CARPAL TUNNEL RELEASE     Bilateral  . CATARACT EXTRACTION W/PHACO  Right 06/20/2015   Procedure: CATARACT EXTRACTION PHACO AND INTRAOCULAR LENS PLACEMENT (IOC);  Surgeon: Rutherford Guys, MD;  Location: [facility];  Service: Ophthalmology;  Laterality: Right;  CDE:6.25  . CATARACT EXTRACTION W/PHACO Left 08/29/2015   Procedure: CATARACT EXTRACTION PHACO AND INTRAOCULAR LENS PLACEMENT (IOC);  Surgeon: Rutherford Guys, MD;  Location: [facility];  Service: Ophthalmology;  Laterality: Left;  CDE: 4.11  . CESAREAN SECTION     x2  . CHOLECYSTECTOMY    . COLONOSCOPY  10/14/2002   EZM:[id number] involving descending colon, splenic flexure and distal transverse colon with most extensive inflammation in the splenic flexure area.  These changes are typical of ischemic colitis.  Biopsy taken.  . COLONOSCOPY N/A 08/04/2012   SLF: POLYPOID LESION IN AT THE APPENDICEAL ORIFICE/Polyp  in the descending colon/Moderate melanosis throughout the entire examined colon/ The colon IS redundant. small tubular adenoma.   . COLONOSCOPY WITH PROPOFOL N/A 11/17/2018   3 tubular adenomas removed, 3 hyperplastic polyps, next colonoscopy in 11/2021.   Marland Kitchen ESOPHAGOGASTRODUODENOSCOPY (EGD) WITH PROPOFOL N/A 05/23/2020   Procedure: ESOPHAGOGASTRODUODENOSCOPY (EGD) WITH PROPOFOL;  Surgeon: Eloise Harman, DO;  [facility];  Service: Endoscopy;  Laterality: N/A;  11:45am  . FOOT SURGERY Bilateral 2004   hammer toe repairs  . PARS PLANA VITRECTOMY W/ REPAIR OF MACULAR HOLE    . POLYPECTOMY  11/17/2018  Procedure: POLYPECTOMY;  Surgeon: Danie Binder, MD;  [facility];  Service: Endoscopy;;  . POLYPECTOMY  05/23/2020   Procedure: POLYPECTOMY;  Surgeon: Eloise Harman, DO;  [facility];  Service: Endoscopy;;    Current Outpatient Medications  Medication Sig Dispense Refill  . APPLE CIDER VINEGAR PO Take 1 tablet by mouth daily.    Marland Kitchen ascorbic acid (VITAMIN C) 500 MG tablet Take 500 mg by mouth 3 (three) times a week.    . cholecalciferol (VITAMIN D3) 25 MCG (1000 UNIT)  tablet Take 1,000 Units by mouth daily.    . Cinnamon 500 MG capsule Take 500 mg by mouth in the morning and at bedtime.    . dicyclomine (BENTYL) 10 MG capsule TAKE 1 CAPSULE BY MOUTH EVERY MORNING FOR DIARRHEA PREVENTION. MAY TAKE UP TO 3 TIMES A DAY FOR ABDOMINAL CRAMPS AND LOOSE STOOL (Patient taking differently: daily. TAKE 1 CAPSULE BY MOUTH EVERY MORNING FOR DIARRHEA PREVENTION. MAY TAKE UP TO 3 TIMES A DAY FOR ABDOMINAL CRAMPS AND LOOSE STOOL) 90 capsule 2  . hydrochlorothiazide (HYDRODIURIL) 25 MG tablet TAKE 1 TABLET(25 MG) BY MOUTH DAILY (Patient taking differently: Take 25 mg by mouth daily. TAKE 1 TABLET(25 MG) BY MOUTH DAILY) 90 tablet 1  . HYDROcodone-acetaminophen (NORCO) 10-325 MG tablet 1 taken 3 times daily as needed for pain (Patient taking differently: 2 (two) times daily. 1 taken 3 times daily as needed for pain) 90 tablet 0  . metFORMIN (GLUCOPHAGE) 500 MG tablet TAKE 1 TABLET BY MOUTH DAILY 30 tablet 5  . pantoprazole (PROTONIX) 40 MG tablet Take 1 tablet (40 mg total) by mouth 2 (two) times daily. TAKE 1 TABLET(40 MG) BY MOUTH DAILY (Patient taking differently: Take 40 mg by mouth daily. TAKE 1 TABLET(40 MG) BY MOUTH DAILY) 90 tablet 1  . phenytoin (DILANTIN) 100 MG ER capsule TAKE 2 CAPSULES BY MOUTH EVERY MORNING AND 2 CAPSULE BY MOUTH EVERY EVENING (Patient taking differently: Take 200 mg by mouth 2 (two) times daily. TAKE 2 CAPSULES BY MOUTH EVERY MORNING AND 2 CAPSULE BY MOUTH EVERY EVENING) 360 capsule 1  . potassium chloride SA (KLOR-CON) 20 MEQ tablet Take 20 mEq by mouth 2 (two) times daily.    . rosuvastatin (CRESTOR) 5 MG tablet TAKE 1 TABLET(5 MG) BY MOUTH DAILY (Patient taking differently: Take 5 mg by mouth daily. TAKE 1 TABLET(5 MG) BY MOUTH DAILY) 90 tablet 1  . sertraline (ZOLOFT) 100 MG tablet 1 and 1/2 tablet daily (Patient taking differently: 100 mg daily.) 135 tablet 1  . torsemide (DEMADEX) 20 MG tablet Take 1 tablet (20 mg total) by mouth daily as needed  (fluid or swelling). 30 tablet 2  . ALPRAZolam (XANAX) 0.5 MG tablet 1/2 tablet to a full tablet for severe anxiousness use sparingly not with hydrocodone (Patient not taking: Reported on 07/12/2020) 20 tablet 0  . metoprolol tartrate (LOPRESSOR) 50 MG tablet Take one tablet by mouth two hours prior to your CT procedure. (Patient not taking: Reported on 07/12/2020) 1 tablet 0   No current facility-administered medications for this visit.    Allergies as of 07/12/2020  . (No Known Allergies)    Family History  Problem Relation Age of Onset  . Colon polyps Mother 72       benign   . Hypertension Maternal Grandmother   . Heart attack Maternal Grandfather   . Emphysema Father   . Asthma Father   . Colon cancer Neg Hx   .  Liver disease Neg Hx     Social History   Socioeconomic History  . Marital status: Married    Spouse name: Not on file  . Number of children: 2  . Years of education: 34  . Highest education level: Not on file  Occupational History  . Occupation: disability  Tobacco Use  . Smoking status: Never Smoker  . Smokeless tobacco: Never Used  . Tobacco comment: smoked x 4 years many years ago  Substance and Sexual Activity  . Alcohol use: Not Currently  . Drug use: No  . Sexual activity: Yes    Birth control/protection: Surgical  Other Topics Concern  . Not on file  Social History Narrative   lives with husband   Social Determinants of Health   Financial Resource Strain: Not on file  Food Insecurity: Not on file  Transportation Needs: Not on file  Physical Activity: Not on file  Stress: Not on file  Social Connections: Not on file    Subjective: Review of Systems  Constitutional: Negative for chills and fever.  HENT: Negative for congestion and hearing loss.   Eyes: Negative for blurred vision and double vision.  Respiratory: Negative for cough and shortness of breath.   Cardiovascular: Negative for chest pain and palpitations.  Gastrointestinal:  Positive for diarrhea. Negative for abdominal pain, blood in stool, constipation, heartburn, melena and vomiting.  Genitourinary: Negative for dysuria and urgency.  Musculoskeletal: Negative for joint pain and myalgias.  Skin: Negative for itching and rash.  Neurological: Negative for dizziness and headaches.  Psychiatric/Behavioral: Negative for depression. The patient is not nervous/anxious.      Objective: BP 138/76   Pulse 64   Temp (!) 97.3 F (36.3 C) (Temporal)   Ht 5\' 2"  (1.575 m)   Wt 217 lb 12.8 oz (98.8 kg)   BMI 39.84 kg/m  Physical Exam Constitutional:      Appearance: Normal appearance.  HENT:     Head: Normocephalic and atraumatic.  Eyes:     Extraocular Movements: Extraocular movements intact.     Conjunctiva/sclera: Conjunctivae normal.  Cardiovascular:     Rate and Rhythm: Normal rate and regular rhythm.  Pulmonary:     Effort: Pulmonary effort is normal.     Breath sounds: Normal breath sounds.  Abdominal:     General: Bowel sounds are normal.     Palpations: Abdomen is soft.  Musculoskeletal:        General: No swelling. Normal range of motion.     Cervical back: Normal range of motion and neck supple.  Skin:    General: Skin is warm and dry.     Coloration: Skin is not jaundiced.  Neurological:     General: No focal deficit present.     Mental Status: Deborah Carroll is alert and oriented to person, place, and time.  Psychiatric:        Mood and Affect: Mood normal.        Behavior: Behavior normal.      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  Deborah Carroll has at least 3 Peutz-Jegher type gastric polyps.  Likelihood of her having syndrome is low though given her age and lack of family history.  No physical exam signs of disease either.  That being said we will  proceed with genetic testing for STK11.  Etiology of her diarrhea unclear.  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  Deborah Carroll is status post cholecystectomy so bile acid diarrhea also on differential.  Given her age and gender, microscopic colitis also on differential.  We will start by increasing her dicyclomine to twice daily.  I have also counseled her on be more aggressive with Imodium as needed.  We will see her back in 6 weeks.  If not improved then we will consider repeat colonoscopy to rule out microscopic colitis.  Can also consider trial of colestipol.   07/12/2020 1:22 PM   Disclaimer: This note was dictated with voice recognition software. Similar sounding words can inadvertently be transcribed and may not be corrected upon review.

## 2020-07-13 DIAGNOSIS — Q858 Other phakomatoses, not elsewhere classified: Secondary | ICD-10-CM | POA: Diagnosis not present

## 2020-07-13 DIAGNOSIS — K317 Polyp of stomach and duodenum: Secondary | ICD-10-CM | POA: Diagnosis not present

## 2020-07-13 LAB — LIPID PANEL
Chol/HDL Ratio: 2.8 ratio (ref 0.0–4.4)
Cholesterol, Total: 142 mg/dL (ref 100–199)
HDL: 50 mg/dL (ref >39)
LDL Chol Calc (NIH): 65 mg/dL (ref 0–99)
Triglycerides: 161 mg/dL — ABNORMAL HIGH (ref 0–149)
VLDL Cholesterol Cal: 27 mg/dL (ref 5–40)

## 2020-07-13 LAB — COMPREHENSIVE METABOLIC PANEL
ALT: 20 IU/L (ref 0–32)
AST: 23 IU/L (ref 0–40)
Albumin/Globulin Ratio: 1.6 (ref 1.2–2.2)
Albumin: 4.2 g/dL (ref 3.8–4.8)
Alkaline Phosphatase: 131 IU/L — ABNORMAL HIGH (ref 44–121)
BUN/Creatinine Ratio: 17 (ref 12–28)
BUN: 11 mg/dL (ref 8–27)
Bilirubin Total: 0.3 mg/dL (ref 0.0–1.2)
CO2: 24 mmol/L (ref 20–29)
Calcium: 10 mg/dL (ref 8.7–10.3)
Chloride: 103 mmol/L (ref 96–106)
Creatinine, Ser: 0.64 mg/dL (ref 0.57–1.00)
Globulin, Total: 2.7 g/dL (ref 1.5–4.5)
Glucose: 128 mg/dL — ABNORMAL HIGH (ref 65–99)
Potassium: 4.2 mmol/L (ref 3.5–5.2)
Sodium: 143 mmol/L (ref 134–144)
Total Protein: 6.9 g/dL (ref 6.0–8.5)
eGFR: 99 mL/min/{1.73_m2} (ref >59)

## 2020-07-13 LAB — MICROALBUMIN / CREATININE URINE RATIO
Creatinine, Urine: 368.2 mg/dL
Microalb/Creat Ratio: 9 mg/g creat (ref 0–29)
Microalbumin, Urine: 31.4 ug/mL

## 2020-07-13 LAB — LIPASE: Lipase: 30 U/L (ref 14–72)

## 2020-07-13 LAB — AMYLASE: Amylase: 29 U/L — ABNORMAL LOW (ref 31–110)

## 2020-07-13 LAB — HEMOGLOBIN A1C
Est. average glucose Bld gHb Est-mCnc: 128 mg/dL
Hgb A1c MFr Bld: 6.1 % — ABNORMAL HIGH (ref 4.8–5.6)

## 2020-07-13 LAB — PHENYTOIN LEVEL, TOTAL: Phenytoin (Dilantin), Serum: 10.7 ug/mL (ref 10.0–20.0)

## 2020-07-20 ENCOUNTER — Telehealth: Payer: Self-pay | Admitting: Internal Medicine

## 2020-07-20 NOTE — Telephone Encounter (Signed)
JENNIFER WITH [facility] [phone] NEEDS PATIENT OFFICE NOTES SENT TO THEM   FAX [phone]   JUST LAST COUPLE OF OFFICE VISITS

## 2020-07-21 NOTE — Telephone Encounter (Signed)
Phoned [facility] and spoke with Anderson Malta (who requested ov notes) to let her know I faxed the last 3 ov notes to her and if for unknown reason if she didn't get them to call back and hit ext. 17 and I will pick up. If not it would be Monday before I can attempt again.   2. Faxed pt's last 3 ov visits to [facility].

## 2020-07-28 DIAGNOSIS — G4733 Obstructive sleep apnea (adult) (pediatric): Secondary | ICD-10-CM | POA: Diagnosis not present

## 2020-07-31 LAB — STK11 SEQUENCING
Comprehensive Interp: NEGATIVE
Interpretation Summary: NEGATIVE
STK11 Del/Dup Interp: NOT DETECTED
STK11 Del/Dup: NEGATIVE
STK11 Seq Interp: NOT DETECTED
STK11 Sequencing: NEGATIVE

## 2020-08-07 ENCOUNTER — Encounter: Payer: Self-pay | Admitting: Family Medicine

## 2020-08-07 ENCOUNTER — Other Ambulatory Visit: Payer: Self-pay

## 2020-08-07 ENCOUNTER — Ambulatory Visit (INDEPENDENT_AMBULATORY_CARE_PROVIDER_SITE_OTHER): Payer: PPO | Admitting: Family Medicine

## 2020-08-07 VITALS — BP 121/76 | HR 70 | Temp 98.2°F | Ht 62.0 in | Wt 220.0 lb

## 2020-08-07 DIAGNOSIS — E1169 Type 2 diabetes mellitus with other specified complication: Secondary | ICD-10-CM

## 2020-08-07 DIAGNOSIS — E785 Hyperlipidemia, unspecified: Secondary | ICD-10-CM | POA: Diagnosis not present

## 2020-08-07 DIAGNOSIS — R7303 Prediabetes: Secondary | ICD-10-CM

## 2020-08-07 DIAGNOSIS — G609 Hereditary and idiopathic neuropathy, unspecified: Secondary | ICD-10-CM

## 2020-08-07 DIAGNOSIS — Z23 Encounter for immunization: Secondary | ICD-10-CM

## 2020-08-07 DIAGNOSIS — Z79891 Long term (current) use of opiate analgesic: Secondary | ICD-10-CM

## 2020-08-07 DIAGNOSIS — R569 Unspecified convulsions: Secondary | ICD-10-CM | POA: Diagnosis not present

## 2020-08-07 DIAGNOSIS — G4733 Obstructive sleep apnea (adult) (pediatric): Secondary | ICD-10-CM

## 2020-08-07 DIAGNOSIS — K582 Mixed irritable bowel syndrome: Secondary | ICD-10-CM | POA: Diagnosis not present

## 2020-08-07 MED ORDER — PHENYTOIN SODIUM EXTENDED 100 MG PO CAPS: ORAL_CAPSULE | ORAL | 1 refills | Status: DC

## 2020-08-07 MED ORDER — SERTRALINE HCL 100 MG PO TABS: ORAL_TABLET | ORAL | 1 refills | Status: DC

## 2020-08-07 MED ORDER — ROSUVASTATIN CALCIUM 5 MG PO TABS: ORAL_TABLET | ORAL | 1 refills | Status: DC

## 2020-08-07 MED ORDER — HYDROCODONE-ACETAMINOPHEN 10-325 MG PO TABS: ORAL_TABLET | ORAL | 0 refills | Status: DC

## 2020-08-07 MED ORDER — HYDROCODONE-ACETAMINOPHEN 10-325 MG PO TABS: 1.0000 | ORAL_TABLET | Freq: Three times a day (TID) | ORAL | 0 refills | Status: AC | PRN

## 2020-08-07 MED ORDER — METFORMIN HCL ER 500 MG PO TB24: 500.0000 mg | ORAL_TABLET | Freq: Every day | ORAL | 1 refills | Status: DC

## 2020-08-07 NOTE — Progress Notes (Signed)
Subjective:    Patient ID: Deborah Carroll, female    DOB: [birth year], 65 y.o.   MRN: [id number]  HPImed check up. Pt states no concerns today.  Idiopathic peripheral neuropathy - Plan: Vitamin B12  Prediabetes - Plan: Basic metabolic panel  Irritable bowel syndrome with both constipation and diarrhea  Need for vaccination - Plan: Pneumococcal polysaccharide vaccine 23-valent greater than or equal to 2yo subcutaneous/IM  Obstructive sleep apnea  Seizures ([place])  Morbid obesity ([place])  Patient is working hard trying to be healthy with her food choices trying to stay more active but still suffers with morbid obesity  Patient does have prediabetes tries to keep things under control with medication although she does have some diarrhea issues I think it is reasonable she can cut down the metformin to just 1/day and lets utilize the extended release she will let us know if ongoing diarrhea issues  Sleep apnea issues utilizes CPAP as directed no problems  Seizure condition takes her medication no seizure currently  This patient was seen today for chronic pain  The medication list was reviewed and updated.  Location of Pain for which the patient has been treated with regarding narcotics: Moderate amount of pain in her lower back radiates into the right leg burning discomfort intermittently  Onset of this pain: Years ago   -Compliance with medication: Takes the medication up to 3 times per day  - Number patient states they take daily: Up to 3 times per day earlier  -when was the last dose patient took?  Earlier today  The patient was advised the importance of maintaining medication and not using illegal substances with these.  Here for refills and follow up  The patient was educated that we can provide 3 monthly scripts for their medication, it is their responsibility to follow the instructions.  Side effects or complications from medications: Denies side effects with  medication  Patient is aware that pain medications are meant to minimize the severity of the pain to allow their pain levels to improve to allow for better function. They are aware of that pain medications cannot totally remove their pain.  Due for UDT ( at least once per year) : Will be due later this year  Scale of 1 to 10 ( 1 is least 10 is most) Your pain level without the medicine: 7 Your pain level with medication 3  Scale 1 to 10 ( 1-helps very little, 10 helps very well) How well does your pain medication reduce your pain so you can function better through out the day?  8  Quality of the pain: Burning aching pain  Persistence of the pain: 24 hours a day  Modifying factors: Worse with activity walking standing climbing     Pneumonia vaccine recommended for patient due to history of diabetes   Review of Systems see above    Objective:   Physical Exam  Lungs clear heart regular HEENT benign extremities no edema negative straight leg raise      Assessment & Plan:  1. Idiopathic peripheral neuropathy The neuropathy could be related to the diabetes it could be on its own but nonetheless is getting worse has severe apathy in the feet burning pain discomfort and numbness proper foot care was discussed in detail check vitamin B12 level - Vitamin B12  Diabetes type 2 with hyperlipidemia Continue current medication change metformin to extended release to reduce diarrhea issues report to [place] if ongoing troubles watch diet stay active -  Basic metabolic panel  3. Irritable bowel syndrome with both constipation and diarrhea Please see above see if metformin change helps  4. Need for vaccination Pneumococcal vaccine - Pneumococcal polysaccharide vaccine 23-valent greater than or equal to 2yo subcutaneous/IM  5. Obstructive sleep apnea Sleep apnea continue CPAP  6. Seizures ([place]) Continue phenytoin check labs later this year no seizures currently  7. Morbid obesity  ([place]) Diet control portion activity monitor  8. Encounter for long-term opiate analgesic use The patient was seen in followup for chronic pain. A review over at their current pain status was discussed. Drug registry was checked. Prescriptions were given.  Regular follow-up recommended. Discussion was held regarding the importance of compliance with medication as well as pain medication contract.  Patient was informed that medication may cause drowsiness and should not be combined  with other medications/alcohol or street drugs. If the patient feels medication is causing altered alertness then do not drive or operate dangerous equipment.  Drug registry checked 3 scripts sent in  Recheck in 3 months

## 2020-08-21 ENCOUNTER — Ambulatory Visit: Payer: PPO | Admitting: Gastroenterology

## 2020-08-25 ENCOUNTER — Telehealth (INDEPENDENT_AMBULATORY_CARE_PROVIDER_SITE_OTHER): Payer: PPO | Admitting: Nurse Practitioner

## 2020-08-25 ENCOUNTER — Telehealth: Payer: Self-pay | Admitting: Nurse Practitioner

## 2020-08-25 ENCOUNTER — Other Ambulatory Visit: Payer: Self-pay

## 2020-08-25 ENCOUNTER — Telehealth: Payer: Self-pay

## 2020-08-25 DIAGNOSIS — U071 COVID-19: Secondary | ICD-10-CM | POA: Diagnosis not present

## 2020-08-25 MED ORDER — NIRMATRELVIR/RITONAVIR (PAXLOVID)TABLET: 3.0000 | ORAL_TABLET | Freq: Two times a day (BID) | ORAL | 0 refills | Status: AC

## 2020-08-25 NOTE — Telephone Encounter (Signed)
I connected with  Deborah Carroll on 08/25/20 by a video enabled telemedicine application and verified that I am speaking with the correct person using two identifiers.   I discussed the limitations of evaluation and management by telemedicine. The patient expressed understanding and agreed to proceed.

## 2020-08-25 NOTE — Telephone Encounter (Signed)
Pt contacted. Pt states she called the ER and Urgent care; they told her it would be Monday before she received any results due to it being Friday and the doctors would just push it to the side. Pt is requesting med for COVID. Please advise. Thank you!

## 2020-08-25 NOTE — Telephone Encounter (Signed)
Pt contacted and verbalized understanding.  

## 2020-08-25 NOTE — Telephone Encounter (Signed)
Did she go to Urgent Care? Does she mean the medication for COVID? Thanks.

## 2020-08-25 NOTE — Progress Notes (Deleted)
   Subjective:    Patient ID: Deborah Carroll, female    DOB: 1955/09/08, 65 y.o.   MRN: [id number]  HPI I connected with  SHAMIR SEDLAR on 08/25/20 by a video enabled telemedicine application and verified that I am speaking with the correct person using two identifiers.   I discussed the limitations of evaluation and management by telemedicine. The patient expressed understanding and agreed to proceed.  Patient location: home  Provider location: in office  I provided *** minutes of non face - to - face time during this encounter. Covid positive patient started symptoms 3 days ago Since Thursday, cough non productive started x 2days ago , chills, body aches, abd pain, diarrhea, bad headache,  back pain , low pelvic pain- taking tylenol, mucinex,  pain meds  Review of Systems     Objective:   Physical Exam        Assessment & Plan:

## 2020-08-25 NOTE — Telephone Encounter (Signed)
Just sent in medicine for COVID. Please remind her to get seen this weekend if worse. Thanks.

## 2020-08-25 NOTE — Telephone Encounter (Signed)
Patient  States you wanted her to go urgent care or ER to give urine sample and told her it would be Monday before she could get results. Can you call in medication that you discuss in the room. [facility]

## 2020-08-26 ENCOUNTER — Encounter: Payer: Self-pay | Admitting: Nurse Practitioner

## 2020-08-26 NOTE — Progress Notes (Signed)
     Virtual Visit via Telephone Note  I connected with Deborah Carroll on 08/26/20 at 11:00 AM EDT by telephone and verified that I am speaking with the correct person using two identifiers.  Location: Patient: home Provider: office   I discussed the limitations, risks, security and privacy concerns of performing an evaluation and management service by telephone and the availability of in person appointments. I also discussed with the patient that there may be a patient responsible charge related to this service. The patient expressed understanding and agreed to proceed.   History of Present Illness: Covid positive patient started symptoms 3 days ago; tested positive 2 days ago Since Thursday, occasional cough non productive started x 2days ago , chills, body aches, abd pain, diarrhea, bad headache,  back pain , low pelvic pain- taking tylenol, mucinex,  pain meds No SOB. Slight wheeze. History of migraines. Headache relieved with tylenol. Mid right back pain worse with movement. Nausea, no vomiting. Urinary frequency but no dysuria. No hematuria. Taking clear liquids. No change in her LE edema. Did not get the COVID vaccine. Husband has also had COVID. Using her CPAP which is helping.    Observations/Objective: Today's visit was via telephone Physical exam was not possible for this visit Alert, oriented. Speech clear. Answering questions appropriately.   Assessment and Plan: COVID-19  Meds ordered this encounter  Medications  . nirmatrelvir/ritonavir EUA (PAXLOVID) TABS    Sig: Take 3 tablets by mouth 2 (two) times daily for 5 days. Patient GFR is 99. Take nirmatrelvir (150 mg) two tablets twice daily for 5 days and ritonavir (100 mg) one tablet twice daily for 5 days.    Dispense:  30 tablet    Refill:  0    Order Specific Question:   Supervising Provider    Answer:   Sallee Lange A [[id number]]   Due to risk factors, prescribed Paxlovid.    Follow Up Instructions: Recommend  that patient be seen at ED or Urgent Care for her urinary issues including right mid back pain. We cannot obtain urine in the office due to positive COVID test. Agrees with this plan. Reviewed symptomatic care and warning signs including maximum dose of Tylenol.  Patient then called back indicating it would be Monday before she got urine sample results. Requested medicine for COVID.  Return if symptoms worsen or fail to improve.     I discussed the assessment and treatment plan with the patient. The patient was provided an opportunity to ask questions and all were answered. The patient agreed with the plan and demonstrated an understanding of the instructions.   The patient was advised to call back or seek an in-person evaluation if the symptoms worsen or if the condition fails to improve as anticipated.  I provided 15 minutes of non-face-to-face time during this encounter.   Nilda Simmer, NP

## 2020-08-31 ENCOUNTER — Ambulatory Visit: Payer: PPO | Admitting: Internal Medicine

## 2020-09-01 ENCOUNTER — Telehealth: Payer: Self-pay

## 2020-09-01 DIAGNOSIS — Z006 Encounter for examination for normal comparison and control in clinical research program: Secondary | ICD-10-CM

## 2020-09-01 NOTE — Telephone Encounter (Signed)
Called patient for 90 day Identify phone call pt stated she is not having anymore cardiac symptoms but did follow up with PCP, I reminded the patient that we would be calling her back around February for a year follow up phone call.  

## 2020-09-18 ENCOUNTER — Other Ambulatory Visit: Payer: Self-pay | Admitting: Family Medicine

## 2020-10-10 ENCOUNTER — Other Ambulatory Visit: Payer: Self-pay

## 2020-10-10 ENCOUNTER — Ambulatory Visit (INDEPENDENT_AMBULATORY_CARE_PROVIDER_SITE_OTHER): Payer: PPO

## 2020-10-10 DIAGNOSIS — Z0000 Encounter for general adult medical examination without abnormal findings: Secondary | ICD-10-CM

## 2020-10-10 NOTE — Patient Instructions (Signed)
Deborah Carroll , Thank you for taking time to come for your Medicare Wellness Visit. I appreciate your ongoing commitment to your health goals. Please review the following plan we discussed and let me know if I can assist you in the future.   Screening recommendations/referrals: Colonoscopy: Up to date, next due 11/16/2021 Mammogram: Up to date, next due 01/17/2021 Bone Density: Not due until age 64 Recommended yearly ophthalmology/optometry visit for glaucoma screening and checkup Recommended yearly dental visit for hygiene and checkup  Vaccinations: Influenza vaccine: Up to date next due fall 2022  Pneumococcal vaccine: up to date, next due 08/14/2021 Tdap vaccine: Currently due, you may await injury to receive  Shingles vaccine: Currently due for Shingrix, if you would like to receive we recommend that you do so at your local pharmacy     Advanced directives: Advance directive discussed with you today. Even though you declined this today please call our office should you change your mind and we can give you the proper paperwork for you to fill out.   Conditions/risks identified: None   Next appointment: None    Preventive Care 65 Years and Older, Female Preventive care refers to lifestyle choices and visits with your health care provider that can promote health and wellness. What does preventive care include? A yearly physical exam. This is also called an annual well check. Dental exams once or twice a year. Routine eye exams. Ask your health care provider how often you should have your eyes checked. Personal lifestyle choices, including: Daily care of your teeth and gums. Regular physical activity. Eating a healthy diet. Avoiding tobacco and drug use. Limiting alcohol use. Practicing safe sex. Taking low-dose aspirin every day. Taking vitamin and mineral supplements as recommended by your health care provider. What happens during an annual well check? The services and  screenings done by your health care provider during your annual well check will depend on your age, overall health, lifestyle risk factors, and family history of disease. Counseling  Your health care provider may ask you questions about your: Alcohol use. Tobacco use. Drug use. Emotional well-being. Home and relationship well-being. Sexual activity. Eating habits. History of falls. Memory and ability to understand (cognition). Work and work Statistician. Reproductive health. Screening  You may have the following tests or measurements: Height, weight, and BMI. Blood pressure. Lipid and cholesterol levels. These may be checked every 5 years, or more frequently if you are over 54 years old. Skin check. Lung cancer screening. You may have this screening every year starting at age 63 if you have a 30-pack-year history of smoking and currently smoke or have quit within the past 15 years. Fecal occult blood test (FOBT) of the stool. You may have this test every year starting at age 82. Flexible sigmoidoscopy or colonoscopy. You may have a sigmoidoscopy every 5 years or a colonoscopy every 10 years starting at age 78. Hepatitis C blood test. Hepatitis B blood test. Sexually transmitted disease (STD) testing. Diabetes screening. This is done by checking your blood sugar (glucose) after you have not eaten for a while (fasting). You may have this done every 1-3 years. Bone density scan. This is done to screen for osteoporosis. You may have this done starting at age 21. Mammogram. This may be done every 1-2 years. Talk to your health care provider about how often you should have regular mammograms. Talk with your health care provider about your test results, treatment options, and if necessary, the need for more tests. Vaccines  Your health care provider may recommend certain vaccines, such as: Influenza vaccine. This is recommended every year. Tetanus, diphtheria, and acellular pertussis (Tdap,  Td) vaccine. You may need a Td booster every 10 years. Zoster vaccine. You may need this after age 110. Pneumococcal 13-valent conjugate (PCV13) vaccine. One dose is recommended after age 59. Pneumococcal polysaccharide (PPSV23) vaccine. One dose is recommended after age 67. Talk to your health care provider about which screenings and vaccines you need and how often you need them. This information is not intended to replace advice given to you by your health care provider. Make sure you discuss any questions you have with your health care provider. Document Released: 04/21/2015 Document Revised: 12/13/2015 Document Reviewed: 01/24/2015 Elsevier Interactive Patient Education  2017 [place] Prevention in the Home Falls can cause injuries. They can happen to people of all ages. There are many things you can do to make your home safe and to help prevent falls. What can I do on the outside of my home? Regularly fix the edges of walkways and driveways and fix any cracks. Remove anything that might make you trip as you walk through a door, such as a raised step or threshold. Trim any bushes or trees on the path to your home. Use bright outdoor lighting. Clear any walking paths of anything that might make someone trip, such as rocks or tools. Regularly check to see if handrails are loose or broken. Make sure that both sides of any steps have handrails. Any raised decks and porches should have guardrails on the edges. Have any leaves, snow, or ice cleared regularly. Use sand or salt on walking paths during winter. Clean up any spills in your garage right away. This includes oil or grease spills. What can I do in the bathroom? Use night lights. Install grab bars by the toilet and in the tub and shower. Do not use towel bars as grab bars. Use non-skid mats or decals in the tub or shower. If you need to sit down in the shower, use a plastic, non-slip stool. Keep the floor dry. Clean up any  water that spills on the floor as soon as it happens. Remove soap buildup in the tub or shower regularly. Attach bath mats securely with double-sided non-slip rug tape. Do not have throw rugs and other things on the floor that can make you trip. What can I do in the bedroom? Use night lights. Make sure that you have a light by your bed that is easy to reach. Do not use any sheets or blankets that are too big for your bed. They should not hang down onto the floor. Have a firm chair that has side arms. You can use this for support while you get dressed. Do not have throw rugs and other things on the floor that can make you trip. What can I do in the kitchen? Clean up any spills right away. Avoid walking on wet floors. Keep items that you use a lot in easy-to-reach places. If you need to reach something above you, use a strong step stool that has a grab bar. Keep electrical cords out of the way. Do not use floor polish or wax that makes floors slippery. If you must use wax, use non-skid floor wax. Do not have throw rugs and other things on the floor that can make you trip. What can I do with my stairs? Do not leave any items on the stairs. Make sure that there are  handrails on both sides of the stairs and use them. Fix handrails that are broken or loose. Make sure that handrails are as long as the stairways. Check any carpeting to make sure that it is firmly attached to the stairs. Fix any carpet that is loose or worn. Avoid having throw rugs at the top or bottom of the stairs. If you do have throw rugs, attach them to the floor with carpet tape. Make sure that you have a light switch at the top of the stairs and the bottom of the stairs. If you do not have them, ask someone to add them for you. What else can I do to help prevent falls? Wear shoes that: Do not have high heels. Have rubber bottoms. Are comfortable and fit you well. Are closed at the toe. Do not wear sandals. If you use a  stepladder: Make sure that it is fully opened. Do not climb a closed stepladder. Make sure that both sides of the stepladder are locked into place. Ask someone to hold it for you, if possible. Clearly mark and make sure that you can see: Any grab bars or handrails. First and last steps. Where the edge of each step is. Use tools that help you move around (mobility aids) if they are needed. These include: Canes. Walkers. Scooters. Crutches. Turn on the lights when you go into a dark area. Replace any light bulbs as soon as they burn out. Set up your furniture so you have a clear path. Avoid moving your furniture around. If any of your floors are uneven, fix them. If there are any pets around you, be aware of where they are. Review your medicines with your doctor. Some medicines can make you feel dizzy. This can increase your chance of falling. Ask your doctor what other things that you can do to help prevent falls. This information is not intended to replace advice given to you by your health care provider. Make sure you discuss any questions you have with your health care provider. Document Released: 01/19/2009 Document Revised: 08/31/2015 Document Reviewed: 04/29/2014 Elsevier Interactive Patient Education  2017 [employer].

## 2020-10-10 NOTE — Progress Notes (Signed)
Subjective:   Deborah Carroll is a 65 y.o. female who presents for an Initial Medicare Annual Wellness Visit.  I connected with Deborah Carroll today by telephone and verified that I am speaking with the correct person using two identifiers. Location patient: home Location provider: work Persons participating in the virtual visit: patient, provider.   I discussed the limitations, risks, security and privacy concerns of performing an evaluation and management service by telephone and the availability of in person appointments. I also discussed with the patient that there may be a patient responsible charge related to this service. The patient expressed understanding and verbally consented to this telephonic visit.    Interactive audio and video telecommunications were attempted between this provider and patient, however failed, due to patient having technical difficulties OR patient did not have access to video capability.  We continued and completed visit with audio only.     Review of Systems    N/A  Cardiac Risk Factors include: advanced age (>94men, >31 women);hypertension;dyslipidemia;diabetes mellitus     Objective:    Today's Vitals   10/10/20 1501  PainSc: 5    There is no height or weight on file to calculate BMI.  Advanced Directives 10/10/2020 05/23/2020 05/19/2020 03/20/2020 11/17/2018 11/13/2018 07/07/2018  Does Patient Have a Medical Advance Directive? No No No No No No Yes  Type of Advance Directive - - - - - - Living will  Does patient want to make changes to medical advance directive? - - - - - - No - Patient declined  Would patient like information on creating a medical advance directive? No - Patient declined No - Patient declined No - Patient declined No - Patient declined No - Patient declined No - Patient declined -  Pre-existing out of facility DNR order (yellow form or pink MOST form) - - - - - - -    Current Medications (verified) Outpatient Encounter Medications  as of 10/10/2020  Medication Sig   APPLE CIDER VINEGAR PO Take 1 tablet by mouth daily.   dicyclomine (BENTYL) 10 MG capsule TAKE 1 CAPSULE BY MOUTH EVERY MORNING FOR DIARRHEA PREVENTION. MAY TAKE UP TO 3 TIMES A DAY FOR ABDOMINAL CRAMPS AND LOOSE STOOL (Patient taking differently: daily. TAKE 1 CAPSULE BY MOUTH EVERY MORNING FOR DIARRHEA PREVENTION. MAY TAKE UP TO 3 TIMES A DAY FOR ABDOMINAL CRAMPS AND LOOSE STOOL)   hydrochlorothiazide (HYDRODIURIL) 25 MG tablet TAKE 1 TABLET(25 MG) BY MOUTH DAILY (Patient taking differently: Take 25 mg by mouth daily. TAKE 1 TABLET(25 MG) BY MOUTH DAILY)   HYDROcodone-acetaminophen (NORCO) 10-325 MG tablet 1 taken 3 times daily as needed for pain   HYDROcodone-acetaminophen (NORCO) 10-325 MG tablet Take one tid prn pain   metFORMIN (GLUCOPHAGE-XR) 500 MG 24 hr tablet Take 1 tablet (500 mg total) by mouth daily with breakfast.   pantoprazole (PROTONIX) 40 MG tablet Take 1 tablet (40 mg total) by mouth 2 (two) times daily. TAKE 1 TABLET(40 MG) BY MOUTH DAILY (Patient taking differently: Take 40 mg by mouth daily. TAKE 1 TABLET(40 MG) BY MOUTH DAILY)   phenytoin (DILANTIN) 100 MG ER capsule TAKE 2 CAPSULES BY MOUTH EVERY MORNING AND 2 CAPSULE BY MOUTH EVERY EVENING   potassium chloride SA (KLOR-CON) 20 MEQ tablet Take 20 mEq by mouth 2 (two) times daily.   rosuvastatin (CRESTOR) 5 MG tablet TAKE 1 TABLET(5 MG) BY MOUTH DAILY   sertraline (ZOLOFT) 100 MG tablet 1  tablet daily   torsemide (DEMADEX) 20 MG  tablet Take 1 tablet (20 mg total) by mouth daily as needed (fluid or swelling).   ALPRAZolam (XANAX) 0.5 MG tablet 1/2 tablet to a full tablet for severe anxiousness use sparingly not with hydrocodone (Patient not taking: Reported on 10/10/2020)   ascorbic acid (VITAMIN C) 500 MG tablet Take 500 mg by mouth 3 (three) times a week. (Patient not taking: Reported on 10/10/2020)   cholecalciferol (VITAMIN D3) 25 MCG (1000 UNIT) tablet Take 1,000 Units by mouth daily. (Patient  not taking: Reported on 10/10/2020)   Cinnamon 500 MG capsule Take 500 mg by mouth in the morning and at bedtime. (Patient not taking: Reported on 10/10/2020)   No facility-administered encounter medications on file as of 10/10/2020.    Allergies (verified) Patient has no known allergies.   History: Past Medical History:  Diagnosis Date   Anxiety    Chronic pain    Depression    Diabetes mellitus without complication (HCC)    GERD (gastroesophageal reflux disease)    Hypertension    Morbid obesity ([place]) 03/20/2018   Neuropathy    Peripheral neuropathy    Peripheral neuropathy    Seizures ([place])    last one 1992- Increased BP with delivery and is on meds still.   Sleep apnea    CPAP x 15 years   Past Surgical History:  Procedure Laterality Date   ABDOMINAL HYSTERECTOMY     ovaries present, no cancer,approx 2001   BACK SURGERY     BIOPSY  05/23/2020   Procedure: BIOPSY;  Surgeon: Eloise Harman, DO;  [facility];  Service: Endoscopy;;   CARPAL TUNNEL RELEASE     Bilateral   CATARACT EXTRACTION W/PHACO Right 06/20/2015   Procedure: CATARACT EXTRACTION PHACO AND INTRAOCULAR LENS PLACEMENT (IOC);  Surgeon: Rutherford Guys, MD;  Location: [facility];  Service: Ophthalmology;  Laterality: Right;  CDE:6.25   CATARACT EXTRACTION W/PHACO Left 08/29/2015   Procedure: CATARACT EXTRACTION PHACO AND INTRAOCULAR LENS PLACEMENT (IOC);  Surgeon: Rutherford Guys, MD;  Location: [facility];  Service: Ophthalmology;  Laterality: Left;  CDE: 4.11   CESAREAN SECTION     x2   CHOLECYSTECTOMY     COLONOSCOPY  10/14/2002   YWV:[id number] involving descending colon, splenic flexure and distal transverse colon with most extensive inflammation in the splenic flexure area.  These changes are typical of ischemic colitis.  Biopsy taken.   COLONOSCOPY N/A 08/04/2012   SLF: POLYPOID LESION IN AT THE APPENDICEAL ORIFICE/Polyp  in the descending colon/Moderate melanosis throughout the entire examined colon/ The  colon IS redundant. small tubular adenoma.    COLONOSCOPY WITH PROPOFOL N/A 11/17/2018   3 tubular adenomas removed, 3 hyperplastic polyps, next colonoscopy in 11/2021.    ESOPHAGOGASTRODUODENOSCOPY (EGD) WITH PROPOFOL N/A 05/23/2020   Procedure: ESOPHAGOGASTRODUODENOSCOPY (EGD) WITH PROPOFOL;  Surgeon: Eloise Harman, DO;  [facility];  Service: Endoscopy;  Laterality: N/A;  11:45am   FOOT SURGERY Bilateral 2004   hammer toe repairs   PARS PLANA VITRECTOMY W/ REPAIR OF MACULAR HOLE     POLYPECTOMY  11/17/2018   Procedure: POLYPECTOMY;  Surgeon: Danie Binder, MD;  [facility];  Service: Endoscopy;;   POLYPECTOMY  05/23/2020   Procedure: POLYPECTOMY;  Surgeon: Eloise Harman, DO;  [facility];  Service: Endoscopy;;   Family History  Problem Relation Age of Onset   Colon polyps Mother 74       benign    Hypertension Maternal Grandmother    Heart  attack Maternal Grandfather    Emphysema Father    Asthma Father    Colon cancer Neg Hx    Liver disease Neg Hx    Social History   Socioeconomic History   Marital status: Married    Spouse name: Not on file   Number of children: 2   Years of education: 12   Highest education level: Not on file  Occupational History   Occupation: disability  Tobacco Use   Smoking status: Never   Smokeless tobacco: Never   Tobacco comments:    smoked x 4 years many years ago  Substance and Sexual Activity   Alcohol use: Not Currently   Drug use: No   Sexual activity: Yes    Birth control/protection: Surgical  Other Topics Concern   Not on file  Social History Narrative   lives with husband   Social Determinants of Health   Financial Resource Strain: Low Risk    Difficulty of Paying Living Expenses: Not hard at all  Food Insecurity: No Food Insecurity   Worried About Charity fundraiser in the Last Year: Never true   Arboriculturist in the Last Year: Never true  Transportation Needs: No Transportation  Needs   Lack of Transportation (Medical): No   Lack of Transportation (Non-Medical): No  Physical Activity: Sufficiently Active   Days of Exercise per Week: 7 days   Minutes of Exercise per Session: 30 min  Stress: No Stress Concern Present   Feeling of Stress : Not at all  Social Connections: Moderately Integrated   Frequency of Communication with Friends and Family: More than three times a week   Frequency of Social Gatherings with Friends and Family: More than three times a week   Attends Religious Services: More than 4 times per year   Active Member of [employer] or Organizations: No   Attends Music therapist: Never   Marital Status: Married    Tobacco Counseling Counseling given: Not Answered Tobacco comments: smoked x 4 years many years ago   Clinical Intake:  Pre-visit preparation completed: Yes  Pain : 0-10 Pain Score: 5  Pain Type: Chronic pain Pain Location: Back (feet) Pain Orientation: Lower, Right, Left Pain Onset: More than a month ago Pain Frequency: Constant Pain Relieving Factors: Hydrocodone  Pain Relieving Factors: Hydrocodone  Nutritional Risks: None Diabetes: Yes CBG done?: No Did pt. bring in CBG monitor from home?: No  How often do you need to have someone help you when you read instructions, pamphlets, or other written materials from your doctor or pharmacy?: 1 - Never  Diabetic?Yes  Nutrition Risk Assessment:  Has the patient had any N/V/D within the last 2 months?  Yes  Does the patient have any non-healing wounds?  No  Has the patient had any unintentional weight loss or weight gain?  No   Diabetes:  Is the patient diabetic?  Yes  If diabetic, was a CBG obtained today?  No  Did the patient bring in their glucometer from home?  No  How often do you monitor your CBG's? Patient states does not checks glucose .   Financial Strains and Diabetes Management:  Are you having any financial strains with the device, your supplies or  your medication? No .  Does the patient want to be seen by Chronic Care Management for management of their diabetes?  No  Would the patient like to be referred to a Nutritionist or for Diabetic Management?  No  Diabetic Exams:  Diabetic Eye Exam: Overdue for diabetic eye exam. Pt has been advised about the importance in completing this exam. Patient advised to call and schedule an eye exam. Diabetic Foot Exam: Completed 08/07/2020   Interpreter Needed?: No  Information entered by :: [place] of Daily Living In your present state of health, do you have any difficulty performing the following activities: 10/10/2020 05/19/2020  Hearing? Tempie Donning  Comment has bilateral hearing aids bilateral hearing aids  Vision? N N  Difficulty concentrating or making decisions? N N  Walking or climbing stairs? N -  Dressing or bathing? N N  Doing errands, shopping? N N  Preparing Food and eating ? N -  Using the Toilet? N -  In the past six months, have you accidently leaked urine? N -  Do you have problems with loss of bowel control? N -  Managing your Medications? N -  Managing your Finances? N -  Housekeeping or managing your Housekeeping? N -  Some recent data might be hidden    Patient Care Team: Kathyrn Drown, MD as PCP - General (Family Medicine) Jettie Booze, MD as PCP - Cardiology (Cardiology) Danie Binder, MD (Inactive) as Attending Physician (Gastroenterology)  Indicate any recent Medical Services you may have received from other than [facility] providers in the past year (date may be approximate).     Assessment:   This is a routine wellness examination for [facility].  Hearing/Vision screen Vision Screening - Comments:: Patient stated she gets eyes examined once per year. Currently wears glasses for distance.   Dietary issues and exercise activities discussed: Current Exercise Habits: Home exercise routine, Type of exercise: walking, Time (Minutes): 30, Frequency  (Times/Week): 7, Weekly Exercise (Minutes/Week): 210, Intensity: Moderate, Exercise limited by: None identified   Goals Addressed             This Visit's Progress    Exercise 150 min/wk Moderate Activity         Depression Screen PHQ 2/9 Scores 10/10/2020 08/25/2020 08/07/2020 04/27/2020 02/01/2020 09/10/2019 03/20/2018  PHQ - 2 Score 0 0 2 6 4 1 2   PHQ- 9 Score 0 0 8 19 11 11 2     Fall Risk Fall Risk  10/10/2020 08/25/2020 07/04/2020 04/23/2019 01/06/2019  Falls in the past year? 0 0 0 0 0  Number falls in past yr: 0 0 0 - -  Injury with Fall? 0 0 0 - -  Risk for fall due to : No Fall Risks - - - -  Follow up Falls evaluation completed;Falls prevention discussed Falls evaluation completed Falls evaluation completed Falls evaluation completed Falls evaluation completed    FALL RISK PREVENTION PERTAINING TO THE HOME:  Any stairs in or around the home? No  If so, are there any without handrails? No  Home free of loose throw rugs in walkways, pet beds, electrical cords, etc? Yes  Adequate lighting in your home to reduce risk of falls? Yes   ASSISTIVE DEVICES UTILIZED TO PREVENT FALLS:  Life alert? No  Use of a cane, walker or w/c? No  Grab bars in the bathroom? No  Shower chair or bench in shower? Yes  Elevated toilet seat or a handicapped toilet? Yes     Cognitive Function:  Normal cognitive status assessed by direct observation by this Nurse Health Advisor. No abnormalities found.        Immunizations Immunization History  Administered Date(s) Administered   Influenza Split 02/22/2013  Influenza,inj,Quad PF,6+ Mos 02/15/2014, 03/20/2018   Influenza-Unspecified 01/22/2012   Pneumococcal Polysaccharide-23 08/07/2020    TDAP status: Due, Education has been provided regarding the importance of this vaccine. Advised may receive this vaccine at local pharmacy or Health Dept. Aware to provide a copy of the vaccination record if obtained from local pharmacy or Health Dept.  Verbalized acceptance and understanding.  Flu Vaccine status: Due, Education has been provided regarding the importance of this vaccine. Advised may receive this vaccine at local pharmacy or Health Dept. Aware to provide a copy of the vaccination record if obtained from local pharmacy or Health Dept. Verbalized acceptance and understanding.  Pneumococcal vaccine status: Up to date  Covid-19 vaccine status: Declined, Education has been provided regarding the importance of this vaccine but patient still declined. Advised may receive this vaccine at local pharmacy or Health Dept.or vaccine clinic. Aware to provide a copy of the vaccination record if obtained from local pharmacy or Health Dept. Verbalized acceptance and understanding.  Qualifies for Shingles Vaccine? Yes   Zostavax completed No   Shingrix Completed?: No.    Education has been provided regarding the importance of this vaccine. Patient has been advised to call insurance company to determine out of pocket expense if they have not yet received this vaccine. Advised may also receive vaccine at local pharmacy or Health Dept. Verbalized acceptance and understanding.  Screening Tests Health Maintenance  Topic Date Due   Pneumococcal Vaccine 16-41 Years old (1 - PCV) Never done   Zoster Vaccines- Shingrix (1 of 2) Never done   OPHTHALMOLOGY EXAM  11/25/2020 (Originally 01/07/2019)   TETANUS/TDAP  08/26/2021 (Originally [birth year]1975)   INFLUENZA VACCINE  11/06/2020   HEMOGLOBIN A1C  01/11/2021   URINE MICROALBUMIN  07/12/2021   FOOT EXAM  08/07/2021   COLONOSCOPY (Pts 45-71yrs Insurance coverage will need to be confirmed)  11/16/2021   MAMMOGRAM  01/17/2022   PNEUMOCOCCAL POLYSACCHARIDE VACCINE AGE 33-64 HIGH RISK  Completed   Hepatitis C Screening  Completed   HIV Screening  Completed   HPV VACCINES  Aged Out   COVID-19 Vaccine  Discontinued    Health Maintenance  Health Maintenance Due  Topic Date Due   Pneumococcal Vaccine 74-51  Years old (1 - PCV) Never done   Zoster Vaccines- Shingrix (1 of 2) Never done    Colorectal cancer screening: Type of screening: Colonoscopy. Completed 11/17/2018. Repeat every 3 years  Mammogram status: Completed 01/18/2020. Repeat every year  Bone Density Status: Not due until age 79  Lung Cancer Screening: (Low Dose CT Chest recommended if Age 64-80 years, 30 pack-year currently smoking OR have quit w/in 15years.) does not qualify.   Lung Cancer Screening Referral: N/A   Additional Screening:  Hepatitis C Screening: does qualify; Completed 10/14/2016  Vision Screening: Recommended annual ophthalmology exams for early detection of glaucoma and other disorders of the eye. Is the patient up to date with their annual eye exam?  Yes  Who is the provider or what is the name of the office in which the patient attends annual eye exams? [facility]  If pt is not established with a provider, would they like to be referred to a provider to establish care? No .   Dental Screening: Recommended annual dental exams for proper oral hygiene  Community Resource Referral / Chronic Care Management: CRR required this visit?  No   CCM required this visit?  No      Plan:     I have personally reviewed and  noted the following in the patient's chart:   Medical and social history Use of alcohol, tobacco or illicit drugs  Current medications and supplements including opioid prescriptions. Patient is not currently taking opioid prescriptions. Functional ability and status Nutritional status Physical activity Advanced directives List of other physicians Hospitalizations, surgeries, and ER visits in previous 12 months Vitals Screenings to include cognitive, depression, and falls Referrals and appointments  In addition, I have reviewed and discussed with patient certain preventive protocols, quality metrics, and best practice recommendations. A written personalized care plan for preventive  services as well as general preventive health recommendations were provided to patient.     Ofilia Neas, LPN   07/10/7355   Nurse Notes: None

## 2020-10-27 ENCOUNTER — Other Ambulatory Visit: Payer: Self-pay | Admitting: *Deleted

## 2020-10-27 MED ORDER — HYDROCHLOROTHIAZIDE 25 MG PO TABS: ORAL_TABLET | ORAL | 0 refills | Status: DC

## 2020-10-29 ENCOUNTER — Other Ambulatory Visit: Payer: Self-pay | Admitting: Family Medicine

## 2020-10-31 ENCOUNTER — Telehealth: Payer: Self-pay | Admitting: *Deleted

## 2020-10-31 NOTE — Telephone Encounter (Signed)
I called [employer], and they relayed that pt cancelled order as pt wanted to wait on the phillips replacement machine.  I called and LMVM for pt, checking on her machine status and would cancel the appt as she does not have new machine.  I wanted her to call back to verify prior to cancelling appt.

## 2020-11-01 ENCOUNTER — Ambulatory Visit: Payer: Self-pay | Admitting: Neurology

## 2020-11-01 NOTE — Telephone Encounter (Signed)
Spoke to pt and relayed that spoke to [facility], new order was cancelled pt waiting to phillips replacement.  She said she did not want phillips machine (but another replacement).  I relayed that will resend the order from 07-2020 (done) to [facility], I cancelled th appt for today as she has no mmachine.  She will f/u with [facility] tomorrow about getting new machine.  She has been w/o one and would like to get back on this as she needs it.  Fax confirmation received [employer].  I relayed to pt when gets new machine she is to call us to set up appt for compliance (very important) due to insurance requirement.

## 2020-12-01 DIAGNOSIS — G4733 Obstructive sleep apnea (adult) (pediatric): Secondary | ICD-10-CM | POA: Diagnosis not present

## 2020-12-06 ENCOUNTER — Other Ambulatory Visit: Payer: Self-pay | Admitting: Family Medicine

## 2020-12-06 ENCOUNTER — Telehealth: Payer: Self-pay | Admitting: Family Medicine

## 2020-12-06 MED ORDER — HYDROCODONE-ACETAMINOPHEN 10-325 MG PO TABS: ORAL_TABLET | ORAL | 0 refills | Status: DC

## 2020-12-06 NOTE — Telephone Encounter (Signed)
Patient has scheduled an appt for 12/19/2020 she is out of her hydrocodone she is requesting refill. She uses [facility] on [address]  CB# [phone]

## 2020-12-06 NOTE — Telephone Encounter (Signed)
Left message to return call 

## 2020-12-06 NOTE — Telephone Encounter (Signed)
Prescription refill was sent in as requested please keep follow-up visit

## 2020-12-12 NOTE — Telephone Encounter (Signed)
Pt contacted and verbalized understanding.  

## 2020-12-16 ENCOUNTER — Other Ambulatory Visit: Payer: Self-pay | Admitting: Family Medicine

## 2020-12-19 ENCOUNTER — Telehealth: Payer: Self-pay

## 2020-12-19 ENCOUNTER — Ambulatory Visit (INDEPENDENT_AMBULATORY_CARE_PROVIDER_SITE_OTHER): Payer: PPO | Admitting: Family Medicine

## 2020-12-19 ENCOUNTER — Other Ambulatory Visit: Payer: Self-pay

## 2020-12-19 DIAGNOSIS — Z79891 Long term (current) use of opiate analgesic: Secondary | ICD-10-CM | POA: Diagnosis not present

## 2020-12-19 DIAGNOSIS — E119 Type 2 diabetes mellitus without complications: Secondary | ICD-10-CM

## 2020-12-19 DIAGNOSIS — E1169 Type 2 diabetes mellitus with other specified complication: Secondary | ICD-10-CM | POA: Diagnosis not present

## 2020-12-19 DIAGNOSIS — R7303 Prediabetes: Secondary | ICD-10-CM

## 2020-12-19 DIAGNOSIS — E785 Hyperlipidemia, unspecified: Secondary | ICD-10-CM

## 2020-12-19 MED ORDER — HYDROCODONE-ACETAMINOPHEN 10-325 MG PO TABS: ORAL_TABLET | ORAL | 0 refills | Status: DC

## 2020-12-19 MED ORDER — ALPRAZOLAM 0.5 MG PO TABS: ORAL_TABLET | ORAL | 1 refills | Status: DC

## 2020-12-19 MED ORDER — HYDROCODONE-ACETAMINOPHEN 10-325 MG PO TABS
ORAL_TABLET | ORAL | 0 refills | Status: DC
Start: 2020-12-19 — End: 2021-03-27

## 2020-12-19 NOTE — Telephone Encounter (Signed)
I connected with  Deborah Carroll on 12/19/20 by a video enabled telemedicine application and verified that I am speaking with the correct person using two identifiers.   I discussed the limitations of evaluation and management by telemedicine. The patient expressed understanding and agreed to proceed.

## 2020-12-19 NOTE — Progress Notes (Signed)
I connected with  Deborah Carroll on 12/19/20 by a video enabled telemedicine application and verified that I am speaking with the correct person using two identifiers.   I discussed the limitations of evaluation and management by telemedicine. The patient expressed understanding and agreed to proceed.  Patient location: home  Provider location: in office  I provided 15 minutes of non face - to - face time during this encounter.   Subjective:    Patient ID: Deborah Carroll, female    DOB: 03/02/1956, 65 y.o.   MRN: [id number] Anxiety and chronic medical conditons  This patient was seen today for chronic pain  The medication list was reviewed and updated.  Location of Pain for which the patient has been treated with regarding narcotics: peripheral neuropathy  Onset of this pain: feet legs and back low and mid area   -Compliance with medication: daily  - Number patient states they take daily: 2.5 to 3  -when was the last dose patient took? 10:00  The patient was advised the importance of maintaining medication and not using illegal substances with these.  Here for refills and follow up  The patient was educated that we can provide 3 monthly scripts for their medication, it is their responsibility to follow the instructions.  Side effects or complications from medications: no  Patient is aware that pain medications are meant to minimize the severity of the pain to allow their pain levels to improve to allow for better function. They are aware of that pain medications cannot totally remove their pain.  Due for UDT ( at least once per year) : 02/01/20  Scale of 1 to 10 ( 1 is least 10 is most) Your pain level without the medicine: 9 or 10 Your pain level with medication 2 or 3  Scale 1 to 10 ( 1-helps very little, 10 helps very well) How well does your pain medication reduce your pain so you can function better through out the day? 7  Quality of the pain: sharp and  tingling  Persistence of the pain: constant  Modifying factors: nothing   The patient feels that the pain medicine does help take away some of the pain in her legs and in her feet allows her to be more active and functional without it she would have a difficult time  She is suffering with anxiety but denies feeling depressed feels most days she is under good control but occasionally does have to take a half a Xanax she only takes it when she is at home and not when she is driving or doing any type of activity outside of the home  She is trying to eat relatively healthy and trying to stay somewhat active     Requests for medication refills  Review of Systems     Objective:   Physical Exam Today's visit was via telephone Physical exam was not possible for this visit        Assessment & Plan:  1. Hyperlipidemia associated with type 2 diabetes mellitus ([place]) She is due for cholesterol and A1c to be done in October with follow-up office visit in 3 months in person - Basic metabolic panel - Urine Microalbumin w/creat. ratio - Lipid panel - Hemoglobin 123456 - Basic metabolic panel  2. Encounter for long-term opiate analgesic use The patient was seen in followup for chronic pain. A review over at their current pain status was discussed. Drug registry was checked. Prescriptions were given.  Regular follow-up recommended.  Discussion was held regarding the importance of compliance with medication as well as pain medication contract.  Patient was informed that medication may cause drowsiness and should not be combined  with other medications/alcohol or street drugs. If the patient feels medication is causing altered alertness then do not drive or operate dangerous equipment. Medication does help her drug registry was checked prescriptions were sent in  Patient was warned not to drive a vehicle if she is feeling drowsy or drugged - HYDROcodone-acetaminophen (NORCO) 10-325 MG tablet;  Take one tid prn pain  Dispense: 90 tablet; Refill: 0 - HYDROcodone-acetaminophen (NORCO) 10-325 MG tablet; 1 taken 3 times daily as needed for pain  Dispense: 90 tablet; Refill: 0 - HYDROcodone-acetaminophen (NORCO) 10-325 MG tablet; Take 1 tab daily as needed for pain  Dispense: 90 tablet; Refill: 0  3. Prediabetes She does have a history of prediabetes but more recent A1c's this year have been elevated into the diabetic range check 123456 - Basic metabolic panel - Urine Microalbumin w/creat. ratio - Lipid panel - Hemoglobin 123456 - Basic metabolic panel  4. Diabetes mellitus without complication ([place]) Watch diet stay physically active try to keep weight under control check lab work this fall with a in person follow-up in 3 months - Basic metabolic panel - Urine Microalbumin w/creat. ratio - Lipid panel - Hemoglobin 123456 - Basic metabolic panel  She will be due for urine drug screen in 3 months

## 2020-12-21 ENCOUNTER — Telehealth: Payer: Self-pay | Admitting: Family Medicine

## 2020-12-21 MED ORDER — POTASSIUM CHLORIDE CRYS ER 20 MEQ PO TBCR
20.0000 meq | EXTENDED_RELEASE_TABLET | Freq: Two times a day (BID) | ORAL | 1 refills | Status: DC
Start: 2020-12-21 — End: 2021-04-24

## 2020-12-21 NOTE — Telephone Encounter (Signed)
I am fine with 6 months worth of medicine but I also recommend that the patient do her lab work between now and the end of October so I will have an up-to-date kidney function and potassium thank you

## 2020-12-21 NOTE — Telephone Encounter (Signed)
Patient is requesting refill on potassium 20 mg called into [facility]

## 2020-12-21 NOTE — Telephone Encounter (Signed)
Left message to return call. Per Epic, last filled by historical provider-need to get information on who prescribed

## 2020-12-21 NOTE — Telephone Encounter (Signed)
Pt returned call and states Dr.Luking is the only one that she sees. Per Epic; last filled by historical provider in Dec 2021. Pt states she did stop taking it for a while and was then advised to restart. Please advise. Thank you  Pt aware we will send in med unless complications.

## 2020-12-21 NOTE — Telephone Encounter (Signed)
Refills sent to pharmacy and pt is aware. Pt verbalized understanding

## 2021-01-01 DIAGNOSIS — G4733 Obstructive sleep apnea (adult) (pediatric): Secondary | ICD-10-CM | POA: Diagnosis not present

## 2021-01-22 ENCOUNTER — Other Ambulatory Visit: Payer: Self-pay | Admitting: Family Medicine

## 2021-01-26 ENCOUNTER — Telehealth: Payer: Self-pay | Admitting: Family Medicine

## 2021-01-26 NOTE — Telephone Encounter (Signed)
[facility] [address] [address] requesting refill on Sertraline 100 mg tablets. Take one tablet po daily. Pt last seen 12/19/20. Please advise. Thank you

## 2021-01-29 MED ORDER — SERTRALINE HCL 100 MG PO TABS: ORAL_TABLET | ORAL | 1 refills | Status: DC

## 2021-01-29 NOTE — Telephone Encounter (Signed)
May have 90 with 1 refill 

## 2021-01-29 NOTE — Telephone Encounter (Signed)
Refills sent to pharmacy. 

## 2021-01-29 NOTE — Addendum Note (Signed)
Addended by: Vicente Males on: 01/29/2021 05:07 PM   Modules accepted: Orders

## 2021-01-30 ENCOUNTER — Other Ambulatory Visit: Payer: Self-pay | Admitting: Family Medicine

## 2021-01-31 DIAGNOSIS — G4733 Obstructive sleep apnea (adult) (pediatric): Secondary | ICD-10-CM | POA: Diagnosis not present

## 2021-03-03 DIAGNOSIS — G4733 Obstructive sleep apnea (adult) (pediatric): Secondary | ICD-10-CM | POA: Diagnosis not present

## 2021-03-06 DIAGNOSIS — G4733 Obstructive sleep apnea (adult) (pediatric): Secondary | ICD-10-CM | POA: Diagnosis not present

## 2021-03-09 ENCOUNTER — Other Ambulatory Visit: Payer: Self-pay | Admitting: Internal Medicine

## 2021-03-19 ENCOUNTER — Telehealth: Payer: Self-pay | Admitting: Family Medicine

## 2021-03-19 MED ORDER — ROSUVASTATIN CALCIUM 5 MG PO TABS: ORAL_TABLET | ORAL | 1 refills | Status: DC

## 2021-03-19 NOTE — Telephone Encounter (Signed)
[facility] [address] [address] requesting refill on Phenytoin 100 mg-take 2 capsules po every morning and 2 capsule po every evening:  Pt last seen 12/19/20. Please advise. Thank you

## 2021-03-20 MED ORDER — PHENYTOIN SODIUM EXTENDED 100 MG PO CAPS: ORAL_CAPSULE | ORAL | 1 refills | Status: DC

## 2021-03-20 NOTE — Addendum Note (Signed)
Addended by: Vicente Males on: 03/20/2021 01:44 PM   Modules accepted: Orders

## 2021-03-20 NOTE — Telephone Encounter (Signed)
May have 6 months on the refill, it is important to remind the patient that as for her pain medicine she will need a follow-up office visit before getting further refills

## 2021-03-20 NOTE — Telephone Encounter (Signed)
Refill sent to pharmacy. Added note that pt needs appt

## 2021-03-26 ENCOUNTER — Other Ambulatory Visit (HOSPITAL_COMMUNITY): Payer: Self-pay | Admitting: Family Medicine

## 2021-03-26 DIAGNOSIS — Z1231 Encounter for screening mammogram for malignant neoplasm of breast: Secondary | ICD-10-CM

## 2021-03-27 ENCOUNTER — Telehealth: Payer: Self-pay | Admitting: Family Medicine

## 2021-03-27 ENCOUNTER — Other Ambulatory Visit: Payer: Self-pay | Admitting: Family Medicine

## 2021-03-27 DIAGNOSIS — Z79891 Long term (current) use of opiate analgesic: Secondary | ICD-10-CM

## 2021-03-27 MED ORDER — ALPRAZOLAM 0.5 MG PO TABS: ORAL_TABLET | ORAL | 0 refills | Status: DC

## 2021-03-27 MED ORDER — HYDROCODONE-ACETAMINOPHEN 10-325 MG PO TABS: ORAL_TABLET | ORAL | 0 refills | Status: DC

## 2021-03-27 NOTE — Telephone Encounter (Signed)
Patient notified and scheduled follow up office visit 04/19/21.  Patient would also like a refill of Xanax

## 2021-03-27 NOTE — Telephone Encounter (Signed)
Last 2 scripts were #90 use TID PRn  the last script she filled was doe one a day PRN- please advise

## 2021-03-27 NOTE — Telephone Encounter (Signed)
Pt called and stated her hydrocodone was 3 times a day as needed but the refill is once a day as needed. [facility] [place]. Would like a call back from the nurse. [phone].

## 2021-03-27 NOTE — Telephone Encounter (Signed)
Done. thanks

## 2021-03-27 NOTE — Telephone Encounter (Signed)
This was a typo.  I corrected the prescription and sent it back in. Please let the patient know that she will also need to set up a follow-up visit before getting further scripts.  I would recommend going ahead and getting that office visit for January because appointments fill up fast

## 2021-04-02 DIAGNOSIS — G4733 Obstructive sleep apnea (adult) (pediatric): Secondary | ICD-10-CM | POA: Diagnosis not present

## 2021-04-18 ENCOUNTER — Ambulatory Visit (HOSPITAL_COMMUNITY)
Admission: RE | Admit: 2021-04-18 | Discharge: 2021-04-18 | Disposition: A | Discharge status: Home / Self Care | Payer: PPO | Source: Ambulatory Visit | Attending: Family Medicine | Admitting: Family Medicine

## 2021-04-18 ENCOUNTER — Other Ambulatory Visit: Payer: Self-pay

## 2021-04-18 DIAGNOSIS — Z1231 Encounter for screening mammogram for malignant neoplasm of breast: Secondary | ICD-10-CM | POA: Insufficient documentation

## 2021-04-19 ENCOUNTER — Encounter: Payer: Self-pay | Admitting: Family Medicine

## 2021-04-19 ENCOUNTER — Ambulatory Visit (INDEPENDENT_AMBULATORY_CARE_PROVIDER_SITE_OTHER): Payer: PPO | Admitting: Family Medicine

## 2021-04-19 VITALS — BP 130/81 | HR 60 | Temp 98.3°F | Wt 223.6 lb

## 2021-04-19 DIAGNOSIS — E538 Deficiency of other specified B group vitamins: Secondary | ICD-10-CM | POA: Diagnosis not present

## 2021-04-19 DIAGNOSIS — Z79899 Other long term (current) drug therapy: Secondary | ICD-10-CM

## 2021-04-19 DIAGNOSIS — Z79891 Long term (current) use of opiate analgesic: Secondary | ICD-10-CM | POA: Diagnosis not present

## 2021-04-19 DIAGNOSIS — E1169 Type 2 diabetes mellitus with other specified complication: Secondary | ICD-10-CM | POA: Diagnosis not present

## 2021-04-19 DIAGNOSIS — E785 Hyperlipidemia, unspecified: Secondary | ICD-10-CM | POA: Diagnosis not present

## 2021-04-19 DIAGNOSIS — G609 Hereditary and idiopathic neuropathy, unspecified: Secondary | ICD-10-CM

## 2021-04-19 DIAGNOSIS — E119 Type 2 diabetes mellitus without complications: Secondary | ICD-10-CM | POA: Diagnosis not present

## 2021-04-19 MED ORDER — HYDROCODONE-ACETAMINOPHEN 10-325 MG PO TABS: ORAL_TABLET | ORAL | 0 refills | Status: DC

## 2021-04-19 MED ORDER — SERTRALINE HCL 100 MG PO TABS
ORAL_TABLET | ORAL | 1 refills | Status: DC
Start: 2021-04-19 — End: 2021-07-18

## 2021-04-19 MED ORDER — METFORMIN HCL ER 500 MG PO TB24: ORAL_TABLET | ORAL | 1 refills | Status: DC

## 2021-04-19 MED ORDER — ALPRAZOLAM 0.5 MG PO TABS: ORAL_TABLET | ORAL | 1 refills | Status: DC

## 2021-04-19 NOTE — Progress Notes (Signed)
Subjective:    Patient ID: Deborah Carroll, female    DOB: 24-Mar-1956, 66 y.o.   MRN: [id number]  HPI This patient was seen today for chronic pain  The medication list was reviewed and updated.  Location of Pain for which the patient has been treated with regarding narcotics:   Onset of this pain:    -Compliance with medication: Hydrocodone 10-325 mg   - Number patient states they take daily: 3  -when was the last dose patient took? Last night about 8 pm  The patient was advised the importance of maintaining medication and not using illegal substances with these.  Here for refills and follow up  The patient was educated that we can provide 3 monthly scripts for their medication, it is their responsibility to follow the instructions.  Side effects or complications from medications: none  Patient is aware that pain medications are meant to minimize the severity of the pain to allow their pain levels to improve to allow for better function. They are aware of that pain medications cannot totally remove their pain.  Due for UDT ( at least once per year) : due today  Scale of 1 to 10 ( 1 is least 10 is most) Your pain level without the medicine: 10 Your pain level with medication: 3   Scale 1 to 10 ( 1-helps very little, 10 helps very well) How well does your pain medication reduce your pain so you can function better through out the day? 8  Quality of the pain: Burning pain in her feet related to neuropathy. Also with back pain and discomfort  Persistence of the pain: Present all the time.  Worse with certain activities Present Modifying factors: Worse with movement and activities.  Patient also relates a lot of numbness in her lower legs mainly in the feet and ankles but in addition to this having some tingling and numbness into the hands at the fingertips denies loss of grip or strength.  Does relate the weakness in her legs make it difficult for her to walk around because  she cannot feel her feet as well        Review of Systems     Objective:   Physical Exam General-in no acute distress Eyes-no discharge Lungs-respiratory rate normal, CTA CV-no murmurs,RRR Extremities skin warm dry no edema Neuro grossly normal Behavior normal, alert Abnormal neuropathy testing in the feet       Assessment & Plan:  1. Encounter for long-term opiate analgesic use The patient was seen in followup for chronic pain. A review over at their current pain status was discussed. Drug registry was checked. Prescriptions were given.  Regular follow-up recommended. Discussion was held regarding the importance of compliance with medication as well as pain medication contract.  Patient was informed that medication may cause drowsiness and should not be combined  with other medications/alcohol or street drugs. If the patient feels medication is causing altered alertness then do not drive or operate dangerous equipment.  Should be noted that the patient appears to be meeting appropriate use of opioids and response.  Evidenced by improved function and decent pain control without significant side effects and no evidence of overt aberrancy issues.  Upon discussion with the patient today they understand that opioid therapy is optional and they feel that the pain has been refractory to reasonable conservative measures and is significant and affecting quality of life enough to warrant ongoing therapy and wishes to continue opioids.  Refills were  provided. She has been faithful with her pain medicine she rarely uses Xanax she only uses that when she is at home she relates the pain medicine does allow her to function better she would like to continue - ToxASSURE Select 13 (MW), Urine  2. Hyperlipidemia associated with type 2 diabetes mellitus ([place]) Very important keep cholesterol under control check lipid profile I doubt that the statin is causing her neuropathy I believe is probably  more related to idiopathic as well as possibility of sugar - T01 - Basic Metabolic Panel (BMET) - Lipid Profile - Hemoglobin A1c - Urine Microalbumin w/creat. ratio - Hepatic function panel  3. Idiopathic peripheral neuropathy We will check a B12 level I also feel the patient would benefit from seeing neurology because she is now starting to have some numbness into the hands I doubt that this is due to medication but theoretically statins could be doing this patient is concerned metformin could be doing this  My hopes is neurology can comment whether or not they feel any of the medications are causing her problems or if this is idiopathic or potentially diabetic related-obviously want to try to prevent this from getting worse - S01 - Basic Metabolic Panel (BMET) - Lipid Profile - Hemoglobin A1c - Urine Microalbumin w/creat. ratio - Hepatic function panel  4. Morbid obesity ([place]) Portion control regular physical activity recommended - U93 - Basic Metabolic Panel (BMET) - Lipid Profile - Hemoglobin A1c - Urine Microalbumin w/creat. ratio - Hepatic function panel  5. High risk medication use Labs ordered - A35 - Basic Metabolic Panel (BMET) - Lipid Profile - Hemoglobin A1c - Urine Microalbumin w/creat. ratio - Hepatic function panel  6. B12 deficiency B12 ordered, peripheral neuropathy check B12 level - T73 - Basic Metabolic Panel (BMET) - Lipid Profile - Hemoglobin A1c - Urine Microalbumin w/creat. ratio - Hepatic function panel  7. Diabetes mellitus without complication ([place]) Check labs watch diet continue meds - U20 - Basic Metabolic Panel (BMET) - Lipid Profile - Hemoglobin A1c - Urine Microalbumin w/creat. ratio - Hepatic function panel

## 2021-04-20 LAB — HEPATIC FUNCTION PANEL
ALT: 20 IU/L (ref 0–32)
AST: 23 IU/L (ref 0–40)
Albumin: 4.3 g/dL (ref 3.8–4.8)
Alkaline Phosphatase: 122 IU/L — ABNORMAL HIGH (ref 44–121)
Bilirubin Total: 0.4 mg/dL (ref 0.0–1.2)
Bilirubin, Direct: 0.15 mg/dL (ref 0.00–0.40)
Total Protein: 7 g/dL (ref 6.0–8.5)

## 2021-04-20 LAB — BASIC METABOLIC PANEL
BUN/Creatinine Ratio: 14 (ref 12–28)
BUN: 10 mg/dL (ref 8–27)
CO2: 28 mmol/L (ref 20–29)
Calcium: 9.3 mg/dL (ref 8.7–10.3)
Chloride: 99 mmol/L (ref 96–106)
Creatinine, Ser: 0.7 mg/dL (ref 0.57–1.00)
Glucose: 104 mg/dL — ABNORMAL HIGH (ref 70–99)
Potassium: 3.4 mmol/L — ABNORMAL LOW (ref 3.5–5.2)
Sodium: 142 mmol/L (ref 134–144)
eGFR: 97 mL/min/{1.73_m2} (ref >59)

## 2021-04-20 LAB — MICROALBUMIN / CREATININE URINE RATIO
Creatinine, Urine: 234.7 mg/dL
Microalb/Creat Ratio: 10 mg/g creat (ref 0–29)
Microalbumin, Urine: 22.9 ug/mL

## 2021-04-20 LAB — HEMOGLOBIN A1C
Est. average glucose Bld gHb Est-mCnc: 126 mg/dL
Hgb A1c MFr Bld: 6 % — ABNORMAL HIGH (ref 4.8–5.6)

## 2021-04-20 LAB — LIPID PANEL
Chol/HDL Ratio: 2.3 ratio (ref 0.0–4.4)
Cholesterol, Total: 143 mg/dL (ref 100–199)
HDL: 61 mg/dL (ref >39)
LDL Chol Calc (NIH): 60 mg/dL (ref 0–99)
Triglycerides: 128 mg/dL (ref 0–149)
VLDL Cholesterol Cal: 22 mg/dL (ref 5–40)

## 2021-04-20 LAB — VITAMIN B12: Vitamin B-12: 642 pg/mL (ref 232–1245)

## 2021-04-23 NOTE — Addendum Note (Signed)
Addended by: Vicente Males on: 04/23/2021 09:23 AM   Modules accepted: Orders

## 2021-04-23 NOTE — Progress Notes (Signed)
04/23/21-neurology referral placed and my chart message sent to patient.

## 2021-04-24 ENCOUNTER — Other Ambulatory Visit: Payer: Self-pay | Admitting: Family Medicine

## 2021-04-24 DIAGNOSIS — E876 Hypokalemia: Secondary | ICD-10-CM

## 2021-04-24 MED ORDER — POTASSIUM CHLORIDE CRYS ER 20 MEQ PO TBCR: 20.0000 meq | EXTENDED_RELEASE_TABLET | Freq: Two times a day (BID) | ORAL | 1 refills | Status: DC

## 2021-04-25 LAB — TOXASSURE SELECT 13 (MW), URINE

## 2021-04-30 ENCOUNTER — Other Ambulatory Visit: Payer: Self-pay | Admitting: Family Medicine

## 2021-05-03 DIAGNOSIS — G4733 Obstructive sleep apnea (adult) (pediatric): Secondary | ICD-10-CM | POA: Diagnosis not present

## 2021-05-14 ENCOUNTER — Other Ambulatory Visit: Payer: Self-pay

## 2021-05-14 ENCOUNTER — Telehealth: Payer: Self-pay | Admitting: Family Medicine

## 2021-05-14 ENCOUNTER — Telehealth: Payer: Self-pay | Admitting: Nurse Practitioner

## 2021-05-14 ENCOUNTER — Ambulatory Visit (INDEPENDENT_AMBULATORY_CARE_PROVIDER_SITE_OTHER): Payer: PPO | Admitting: Nurse Practitioner

## 2021-05-14 ENCOUNTER — Encounter: Payer: Self-pay | Admitting: Nurse Practitioner

## 2021-05-14 DIAGNOSIS — U071 COVID-19: Secondary | ICD-10-CM | POA: Diagnosis not present

## 2021-05-14 MED ORDER — MOLNUPIRAVIR EUA 200MG CAPSULE: 4.0000 | ORAL_CAPSULE | Freq: Two times a day (BID) | ORAL | 0 refills | Status: AC

## 2021-05-14 MED ORDER — ONDANSETRON 8 MG PO TBDP: 8.0000 mg | ORAL_TABLET | Freq: Three times a day (TID) | ORAL | 1 refills | Status: DC | PRN

## 2021-05-14 NOTE — Telephone Encounter (Signed)
Patient woke up this morning throwing and nausea and would like something called in for nausea. [facility]

## 2021-05-14 NOTE — Telephone Encounter (Signed)
Pt called and said her Covid test was slightly pink under the blue line. She feels she is positive for Covid. FYI

## 2021-05-14 NOTE — Telephone Encounter (Signed)
Patient scheduled phone visit today with Barbee Shropshire NP

## 2021-05-14 NOTE — Telephone Encounter (Signed)
Zofran 8 mg ODT, #12, 1 sublingual every 8 hours as needed nausea vomiting, 1 refill, if ongoing troubles follow-up

## 2021-05-14 NOTE — Progress Notes (Signed)
° °  Subjective:    Patient ID: Deborah Carroll, female    DOB: 03/24/1956, 66 y.o.   MRN: [id number]  HPI  Virtual Visit via Telephone Note  I connected with Deborah Carroll on 05/14/21 at  4:00 PM EST by telephone and verified that I am speaking with the correct person using two identifiers.  Location: Patient: home Provider: office   I discussed the limitations, risks, security and privacy concerns of performing an evaluation and management service by telephone and the availability of in person appointments. I also discussed with the patient that there may be a patient responsible charge related to this service. The patient expressed understanding and agreed to proceed.   History of Present Illness:  Pt tested positive for COVID this morning. Began to feel bad when she woke up around 6:20 this morning. Pt having vomiting, nausea, headache, diarrhea, chills, eyes hurt, body aches/back pain. (Pt has neuropathy).  Patient denies shortness of breath or any difficulty breathing.  Patient is taking Norco for pain and and Dilantin as prescribed.   Observations/Objective:  Patient was able to answer questions without difficulty.  No signs of distress noted via telephone visit.  Assessment and Plan: 1. COVID-19 -Medications reviewed for medication interactions. -Paxlovid considered however patient on Crestor for cholesterol and Dilantin for seizures.  Decided to go with Lagevrio for antiviral therapy instead of Paxlovid due to interaction with Dilantin. - molnupiravir EUA (LAGEVRIO) 200 mg CAPS capsule; Take 4 capsules (800 mg total) by mouth 2 (two) times daily for 5 days.  Dispense: 40 capsule; Refill: 0 -Go to ED if you experience any difficulty breathing or shortness of breath. -COVID is detected Under current CDC guidelines it is recommended to stay self isolated for at least 5 days.  If feeling well after 5 days may return to normal activities as long as you wears a mask for 5 days.  If you  are not feeling well after 5 days you should stay under self-isolation for 10 days.  Warning signs to watch for if you develops chest tightness shortness of breath severe pain change in mental status you should seek further evaluation in the ER.  If further questions or concerns please let us know    Follow Up Instructions:    I discussed the assessment and treatment plan with the patient. The patient was provided an opportunity to ask questions and all were answered. The patient agreed with the plan and demonstrated an understanding of the instructions.   The patient was advised to call back or seek an in-person evaluation if the symptoms worsen or if the condition fails to improve as anticipated.  I provided 15 minutes of non-face-to-face time during this encounter.

## 2021-05-14 NOTE — Telephone Encounter (Signed)
Ms. eleanore, junio are scheduled for a virtual visit with your provider today.    Just as we do with appointments in the office, we must obtain your consent to participate.  Your consent will be active for this visit and any virtual visit you may have with one of our providers in the next 365 days.    If you have a MyChart account, I can also send a copy of this consent to you electronically.  All virtual visits are billed to your insurance company just like a traditional visit in the office.  As this is a virtual visit, video technology does not allow for your provider to perform a traditional examination.  This may limit your provider's ability to fully assess your condition.  If your provider identifies any concerns that need to be evaluated in person or the need to arrange testing such as labs, EKG, etc, we will make arrangements to do so.    Although advances in technology are sophisticated, we cannot ensure that it will always work on either your end or our end.  If the connection with a video visit is poor, we may have to switch to a telephone visit.  With either a video or telephone visit, we are not always able to ensure that we have a secure connection.   I need to obtain your verbal consent now.   Are you willing to proceed with your visit today?   CHRISIE JANKOVICH has provided verbal consent on 05/14/2021 for a virtual visit (video or telephone).   Vicente Males, LPN 4/0/9811  9:14 PM

## 2021-05-14 NOTE — Telephone Encounter (Signed)
Prescription sent electronically to pharmacy. Patient scheduled telephone visit today with Leonna Np for Covid positive test this am

## 2021-05-18 ENCOUNTER — Other Ambulatory Visit: Payer: Self-pay | Admitting: Family Medicine

## 2021-06-03 DIAGNOSIS — G4733 Obstructive sleep apnea (adult) (pediatric): Secondary | ICD-10-CM | POA: Diagnosis not present

## 2021-06-16 ENCOUNTER — Other Ambulatory Visit: Payer: Self-pay | Admitting: Gastroenterology

## 2021-06-18 NOTE — Telephone Encounter (Signed)
Last office visit 07/12/20 ?

## 2021-06-19 ENCOUNTER — Other Ambulatory Visit: Payer: Self-pay | Admitting: *Deleted

## 2021-06-19 ENCOUNTER — Telehealth: Payer: Self-pay

## 2021-06-19 MED ORDER — DICYCLOMINE HCL 10 MG PO CAPS: ORAL_CAPSULE | ORAL | 3 refills | Status: DC

## 2021-06-19 NOTE — Telephone Encounter (Signed)
May have 4 refills 

## 2021-06-19 NOTE — Telephone Encounter (Signed)
Refills sent to pharmacy. 

## 2021-06-19 NOTE — Telephone Encounter (Signed)
Pt called in requesting a refill of  ? ?dicyclomine (BENTYL) 10 MG capsule [[id number]]  ? ?Dose, Route, Frequency: As Directed  ?Dispense Quantity: 90 capsule Refills: 2   ? ?

## 2021-06-19 NOTE — Telephone Encounter (Signed)
Patient informed refills sent to pharmacy. Verbalized understanding. ?

## 2021-06-27 ENCOUNTER — Ambulatory Visit: Payer: PPO | Admitting: Diagnostic Neuroimaging

## 2021-06-27 ENCOUNTER — Encounter: Payer: Self-pay | Admitting: Diagnostic Neuroimaging

## 2021-06-27 VITALS — BP 159/92 | HR 63 | Ht 62.0 in | Wt 224.0 lb

## 2021-06-27 DIAGNOSIS — G4733 Obstructive sleep apnea (adult) (pediatric): Secondary | ICD-10-CM | POA: Diagnosis not present

## 2021-06-27 DIAGNOSIS — G40909 Epilepsy, unspecified, not intractable, without status epilepticus: Secondary | ICD-10-CM | POA: Diagnosis not present

## 2021-06-27 DIAGNOSIS — G609 Hereditary and idiopathic neuropathy, unspecified: Secondary | ICD-10-CM

## 2021-06-27 DIAGNOSIS — Z9989 Dependence on other enabling machines and devices: Secondary | ICD-10-CM

## 2021-06-27 NOTE — Patient Instructions (Signed)
SEIZURE DISORDER (since 1982; last seizure 1992) ?- doing well; continue dilantin ('200mg'$  twice a day); previously tried coming off dilantin, but had breakthrough seizures ? ?NEUROPATHY (prior dx of idiopathic small fiber neuropathy; could be related to dilantin also) ?- monitor sugars; continue pain mgmt ? ?OSA on CPAP ?- per Dr. Rexene Alberts ?

## 2021-06-27 NOTE — Progress Notes (Signed)
? ?[facility] ? ?PATIENT: Deborah Carroll ?DOB: 14-Jan-1956 ? ?REFERRING CLINICIAN: Kathyrn Drown, MD ?HISTORY FROM: patient  ?REASON FOR VISIT: new consult  ? ? ?HISTORICAL ? ?CHIEF COMPLAINT:  ?Chief Complaint  ?Patient presents with  ? Follow-up  ?  Rm 7 alone  ?Pt is well, has bene having tingling, numbness, burning and shooting pain in feet for about 12-15 yrs and also her hands recently started having symptoms   ? ? ?HISTORY OF PRESENT ILLNESS:  ? ?UPDATE (06/27/21, VRP): Since last visit, symptoms are progressed; now up to shins and into fingertips. Some pain and burning. DM is improving. Mother had similar neuropathy. No seizures. ? ?PRIOR HPI: 66 year old female here for evaluation of neuropathy and seizures. ? ?Patient had first convulsive seizure in 1982 (3 to 6 months after her pregnancy).  She was started on Dilantin.  She had some intermittent seizures when she ran out of medication or missed doses.  Her last seizure was in 1992.  She has continued on Dilantin since that time. ? ?Patient developed hammertoes in 1995.  She underwent some surgeries for it.  Following this she had some burning and numbness in her toes.  She had neurology evaluation.  Apparently EMG was normal, but possibility of small fiber neuropathy was raised.  She was tried on gabapentin and Lyrica without relief. ? ?Patient also has had low back pain issues for a long time and underwent back surgery 2007.  She has been under pain management over the past few years with hydrocodone. ? ? ?REVIEW OF SYSTEMS: Full 14 system review of systems performed and negative with exception of: As per HPI. ? ?ALLERGIES: ?No Known Allergies ? ?HOME MEDICATIONS: ?Outpatient Medications Prior to Visit  ?Medication Sig Dispense Refill  ? ALPRAZolam (XANAX) 0.5 MG tablet 1/2 tablet to a full tablet for severe anxiousness use sparingly not with hydrocodone 20 tablet 1  ? APPLE CIDER VINEGAR PO Take 1 tablet by mouth daily.    ? ascorbic  acid (VITAMIN C) 500 MG tablet Take 500 mg by mouth 3 (three) times a week.    ? cholecalciferol (VITAMIN D3) 25 MCG (1000 UNIT) tablet Take 1,000 Units by mouth daily.    ? Cinnamon 500 MG capsule Take 500 mg by mouth in the morning and at bedtime.    ? dicyclomine (BENTYL) 10 MG capsule TAKE 1 CAPSULE BY MOUTH EVERY MORNING FOR DIARRHEA PREVENTION. MAY TAKE UP TO 3 TIMES A DAY FOR ABDOMINAL CRAMPS AND LOOSE STOOL 90 capsule 3  ? hydrochlorothiazide (HYDRODIURIL) 25 MG tablet TAKE 1 TABLET(25 MG) BY MOUTH DAILY 90 tablet 0  ? HYDROcodone-acetaminophen (NORCO) 10-325 MG tablet Take 1 tab three times daily as needed for pain 90 tablet 0  ? metFORMIN (GLUCOPHAGE-XR) 500 MG 24 hr tablet TAKE 1 TABLET(500 MG) BY MOUTH DAILY WITH BREAKFAST 90 tablet 1  ? ondansetron (ZOFRAN-ODT) 8 MG disintegrating tablet Take 1 tablet (8 mg total) by mouth every 8 (eight) hours as needed for nausea or vomiting. 12 tablet 1  ? pantoprazole (PROTONIX) 40 MG tablet TAKE 1 TABLET BY MOUTH DAILY 90 tablet 1  ? phenytoin (DILANTIN) 100 MG ER capsule TAKE 2 CAPSULES BY MOUTH EVERY MORNING AND 2 CAPSULE BY MOUTH EVERY EVENING 360 capsule 1  ? potassium chloride SA (KLOR-CON M) 20 MEQ tablet Take 1 tablet (20 mEq total) by mouth 2 (two) times daily. 180 tablet 1  ? rosuvastatin (CRESTOR) 5 MG tablet TAKE 1 TABLET(5 MG) BY MOUTH  DAILY 90 tablet 1  ? sertraline (ZOLOFT) 100 MG tablet Take 1 1/2 tablet po daily 135 tablet 1  ? torsemide (DEMADEX) 20 MG tablet Take 1 tablet (20 mg total) by mouth daily as needed (fluid or swelling). 30 tablet 2  ? HYDROcodone-acetaminophen (NORCO) 10-325 MG tablet Take one tid prn pain (Patient not taking: Reported on 06/27/2021) 90 tablet 0  ? HYDROcodone-acetaminophen (NORCO) 10-325 MG tablet 1 taken 3 times daily as needed for pain (Patient not taking: Reported on 06/27/2021) 90 tablet 0  ? ?No facility-administered medications prior to visit.  ? ? ?PAST MEDICAL HISTORY: ?Past Medical History:  ?Diagnosis Date  ?  Anxiety   ? Chronic pain   ? Depression   ? Diabetes mellitus without complication ([place])   ? GERD (gastroesophageal reflux disease)   ? Hypertension   ? Morbid obesity ([place]) 03/20/2018  ? Neuropathy   ? Peripheral neuropathy   ? Peripheral neuropathy   ? Seizures ([place])   ? last one 1992- Increased BP with delivery and is on meds still.  ? Sleep apnea   ? CPAP x 15 years  ? ? ?PAST SURGICAL HISTORY: ?Past Surgical History:  ?Procedure Laterality Date  ? ABDOMINAL HYSTERECTOMY    ? ovaries present, no cancer,approx 2001  ? BACK SURGERY    ? BIOPSY  05/23/2020  ? Procedure: BIOPSY;  Surgeon: Eloise Harman, DO;  Location: [facility];  Service: Endoscopy;;  ? CARPAL TUNNEL RELEASE    ? Bilateral  ? CATARACT EXTRACTION W/PHACO Right 06/20/2015  ? Procedure: CATARACT EXTRACTION PHACO AND INTRAOCULAR LENS PLACEMENT (IOC);  Surgeon: Rutherford Guys, MD;  Location: [facility];  Service: Ophthalmology;  Laterality: Right;  CDE:6.25  ? CATARACT EXTRACTION W/PHACO Left 08/29/2015  ? Procedure: CATARACT EXTRACTION PHACO AND INTRAOCULAR LENS PLACEMENT (IOC);  Surgeon: Rutherford Guys, MD;  Location: [facility];  Service: Ophthalmology;  Laterality: Left;  CDE: 4.11  ? CESAREAN SECTION    ? x2  ? CHOLECYSTECTOMY    ? COLONOSCOPY  10/14/2002  ? LHT:[id number] involving descending colon, splenic flexure and distal transverse colon with most extensive inflammation in the splenic flexure area.  These changes are typical of ischemic colitis.  Biopsy taken.  ? COLONOSCOPY N/A 08/04/2012  ? SLF: POLYPOID LESION IN AT THE APPENDICEAL ORIFICE/Polyp  in the descending colon/Moderate melanosis throughout the entire examined colon/ The colon IS redundant. small tubular adenoma.   ? COLONOSCOPY WITH PROPOFOL N/A 11/17/2018  ? 3 tubular adenomas removed, 3 hyperplastic polyps, next colonoscopy in 11/2021.   ? ESOPHAGOGASTRODUODENOSCOPY (EGD) WITH PROPOFOL N/A 05/23/2020  ? Procedure: ESOPHAGOGASTRODUODENOSCOPY (EGD) WITH PROPOFOL;  Surgeon: Eloise Harman,  DO;  Location: [facility];  Service: Endoscopy;  Laterality: N/A;  11:45am  ? FOOT SURGERY Bilateral 2004  ? hammer toe repairs  ? PARS PLANA VITRECTOMY W/ REPAIR OF MACULAR HOLE    ? POLYPECTOMY  11/17/2018  ? Procedure: POLYPECTOMY;  Surgeon: Danie Binder, MD;  Location: [facility];  Service: Endoscopy;;  ? POLYPECTOMY  05/23/2020  ? Procedure: POLYPECTOMY;  Surgeon: Eloise Harman, DO;  Location: [facility];  Service: Endoscopy;;  ? ? ?FAMILY HISTORY: ?Family History  ?Problem Relation Age of Onset  ? Colon polyps Mother 41  ?     benign   ? Hypertension Maternal Grandmother   ? Heart attack Maternal Grandfather   ? Emphysema Father   ? Asthma Father   ? Colon cancer Neg Hx   ? Liver disease  Neg Hx   ? ? ?SOCIAL HISTORY: ?Social History  ? ?Socioeconomic History  ? Marital status: Married  ?  Spouse name: Not on file  ? Number of children: 2  ? Years of education: 62  ? Highest education level: Not on file  ?Occupational History  ? Occupation: disability  ?Tobacco Use  ? Smoking status: Never  ? Smokeless tobacco: Never  ? Tobacco comments:  ?  smoked x 4 years many years ago  ?Substance and Sexual Activity  ? Alcohol use: Not Currently  ? Drug use: No  ? Sexual activity: Yes  ?  Birth control/protection: Surgical  ?Other Topics Concern  ? Not on file  ?Social History Narrative  ? lives with husband  ? ?Social Determinants of Health  ? ?Financial Resource Strain: Low Risk   ? Difficulty of Paying Living Expenses: Not hard at all  ?Food Insecurity: No Food Insecurity  ? Worried About Charity fundraiser in the Last Year: Never true  ? Ran Out of Food in the Last Year: Never true  ?Transportation Needs: No Transportation Needs  ? Lack of Transportation (Medical): No  ? Lack of Transportation (Non-Medical): No  ?Physical Activity: Sufficiently Active  ? Days of Exercise per Week: 7 days  ? Minutes of Exercise per Session: 30 min  ?Stress: No Stress Concern Present  ? Feeling of Stress : Not at all   ?Social Connections: Moderately Integrated  ? Frequency of Communication with Friends and Family: More than three times a week  ? Frequency of Social Gatherings with Friends and Family: More than three ti

## 2021-07-01 DIAGNOSIS — G4733 Obstructive sleep apnea (adult) (pediatric): Secondary | ICD-10-CM | POA: Diagnosis not present

## 2021-07-02 ENCOUNTER — Encounter: Payer: Self-pay | Admitting: Nurse Practitioner

## 2021-07-02 ENCOUNTER — Other Ambulatory Visit: Payer: Self-pay

## 2021-07-02 ENCOUNTER — Ambulatory Visit (INDEPENDENT_AMBULATORY_CARE_PROVIDER_SITE_OTHER): Payer: PPO | Admitting: Nurse Practitioner

## 2021-07-02 VITALS — BP 140/90 | HR 96 | Temp 97.4°F | Ht 62.0 in | Wt 223.0 lb

## 2021-07-02 DIAGNOSIS — R102 Pelvic and perineal pain: Secondary | ICD-10-CM | POA: Diagnosis not present

## 2021-07-02 NOTE — Progress Notes (Signed)
? ?Subjective:  ? ? Patient ID: Deborah Carroll, female    DOB: 01-25-1956, 66 y.o.   MRN: [id number] ? ?HPI ? ?66 year old female with history of hysterectomy presents to the clinic with intermittent low pelvic pain and intermittent vaginal pain x 3. ? ?Patient describes vaginal pain as a sharp pain inside of her vagina that she first noticed while she was in the shower after having intercourse.  Patient also describes feeling of vaginal fullness where she feels like "everything is going to fall out".  Patient denies any vaginal blood/postmenopausal bleeding or pain during intercourse. ? ?Patient describes low pelvic pain as a crampy pain for approximately 3 to 4 months.  Patient also admits to early satiety, changes to bowel habits and watery stools, 20 pound unintentional weight loss, and bloating for the past year. ? ?Colonoscopy completed August 2020, 3 simple adenomas and 3 hyperplastic polyps were removed.  Repeat colonoscopy recommended in 1 to 3 years.   ? ?Patient states that she had a hysterectomy (patient is unsure if she still has her cervix) due to adenomyosis and endometriosis.  Patient states that she had some ovarian cysts during that time but has not had them checked since her hysterectomy. ? ? ?Review of Systems  ?Gastrointestinal:  Positive for diarrhea.  ?Genitourinary:  Positive for pelvic pain and vaginal pain.  ?     Vaginal fullness  ?All other systems reviewed and are negative. ? ?   ?Objective:  ? Physical Exam ?Exam conducted with a chaperone present.  ?Constitutional:   ?   General: She is not in acute distress. ?   Appearance: Normal appearance. She is obese. She is not ill-appearing, toxic-appearing or diaphoretic.  ?Cardiovascular:  ?   Rate and Rhythm: Normal rate and regular rhythm.  ?   Pulses: Normal pulses.  ?   Heart sounds: No murmur heard. ?Pulmonary:  ?   Effort: Pulmonary effort is normal. No respiratory distress.  ?   Breath sounds: Normal breath sounds. No wheezing.   ?Abdominal:  ?   General: There is distension.  ?   Palpations: Abdomen is soft.  ?   Hernia: A hernia is present. There is no hernia in the left inguinal area or right inguinal area.  ?   Comments: Unable to fully assess abdomen due to habitus  ?Genitourinary: ?   Exam position: Lithotomy position.  ?   Pubic Area: No rash.   ?   Labia:     ?   Right: No rash, tenderness, lesion or injury.     ?   Left: No rash, tenderness, lesion or injury.   ?   Urethra: No prolapse, urethral pain, urethral swelling or urethral lesion.  ?   Vagina: No signs of injury and foreign body. Tenderness present. No vaginal discharge, erythema, bleeding, lesions or prolapsed vaginal walls.  ?   Uterus: Absent.   ?   Rectum: Normal.  ?   Comments: Tenderness during palpation and speculum examination noted to anterior vaginal wall. ? ?Cervix and cervical os was not observed during exam. ? ?Unable to examine adnexa due to habitus. ?Musculoskeletal:  ?   Comments: Grossly intact  ?Lymphadenopathy:  ?   Lower Body: No right inguinal adenopathy. No left inguinal adenopathy.  ?Skin: ?   General: Skin is warm.  ?   Capillary Refill: Capillary refill takes less than 2 seconds.  ?Neurological:  ?   Mental Status: She is alert.  ?   Comments: Grossly  intact  ?Psychiatric:     ?   Mood and Affect: Mood normal.     ?   Behavior: Behavior normal.  ? ? ? ?   ?Assessment & Plan:  ?1. Vaginal pain ?-Unsure of etiology however suspect prolapse. ?-We will evaluate with transvaginal ultrasound ?- US Transvaginal Non-OB; Stat ?- Ambulatory referral to Gynecology ?-Follow-up in 6 weeks ? ?2. Pelvic pain ?-Due to complaints of early society, bowel changes, 20 pound unintentional weight loss over the past year would like to rule out ovarian cancer or ovarian changes. ?-Transvaginal ultrasound to evaluate reproductive structures. ?-We will also send to GYN for further evaluation following transvaginal exam if warranted. ?- US Transvaginal Non-OB; Stat ?-  Ambulatory referral to Gynecology ?-Follow-up in 6 weeks or sooner if needed ? ? ?

## 2021-07-05 ENCOUNTER — Ambulatory Visit (HOSPITAL_COMMUNITY): Payer: PPO

## 2021-07-06 ENCOUNTER — Ambulatory Visit (HOSPITAL_COMMUNITY)
Admission: RE | Admit: 2021-07-06 | Discharge: 2021-07-06 | Disposition: A | Discharge status: Home / Self Care | Payer: PPO | Source: Ambulatory Visit | Attending: Nurse Practitioner | Admitting: Nurse Practitioner

## 2021-07-06 DIAGNOSIS — R102 Pelvic and perineal pain: Secondary | ICD-10-CM | POA: Diagnosis not present

## 2021-07-06 DIAGNOSIS — Z90710 Acquired absence of both cervix and uterus: Secondary | ICD-10-CM | POA: Diagnosis not present

## 2021-07-06 DIAGNOSIS — N83209 Unspecified ovarian cyst, unspecified side: Secondary | ICD-10-CM | POA: Diagnosis not present

## 2021-07-10 ENCOUNTER — Ambulatory Visit: Payer: PPO | Admitting: Adult Health

## 2021-07-10 ENCOUNTER — Encounter: Payer: Self-pay | Admitting: Adult Health

## 2021-07-10 VITALS — BP 125/80 | HR 68 | Ht 62.0 in | Wt 224.0 lb

## 2021-07-10 DIAGNOSIS — R102 Pelvic and perineal pain: Secondary | ICD-10-CM

## 2021-07-10 DIAGNOSIS — N898 Other specified noninflammatory disorders of vagina: Secondary | ICD-10-CM

## 2021-07-10 DIAGNOSIS — Z90710 Acquired absence of both cervix and uterus: Secondary | ICD-10-CM | POA: Diagnosis not present

## 2021-07-10 DIAGNOSIS — N952 Postmenopausal atrophic vaginitis: Secondary | ICD-10-CM

## 2021-07-10 NOTE — Progress Notes (Signed)
?  Subjective:  ?  ? Patient ID: Deborah Carroll, female   DOB: 12/04/1955, 66 y.o.   MRN: [id number] ? ?HPI ?[name] is a 66 year old white female,married, sp hysterectomy in complaining of vaginal pain, has seen PCP and had [place]. ?PCP is Dr Sallee Lange. ? ?Review of Systems ?Has vaginal pain, started around Thanksgiving, will be sharp a times, and has pelvic pressure and some pain at times. It is not constant. ?Has pain with sex at times ?Denies any problems with urination or BMs ?Reviewed past medical,surgical, social and family history. Reviewed medications and allergies.  ?   ?Objective:  ? Physical Exam ?BP 125/80 (BP Location: Left Arm, Patient Position: Sitting, Cuff Size: Large)   Pulse 68   Ht '5\' 2"'$  (1.575 m)   Wt 224 lb (101.6 kg)   BMI 40.97 kg/m?   ?Skin warm and dry.. Lungs: clear to ausculation bilaterally. Cardiovascular: regular rate and rhythm.  ?Pelvic: external genitalia is normal in appearance no lesions, vagina: pale with loss of rugae,urethra has no lesions or masses noted, cervix and uterus absent, adnexa: no masses,+tenderness noted. Bladder is non tender and no masses felt.On rectal exam has good tone, no masses or rectocele felt. ?[place] reviewed with her and also discussed with Dr Elonda Husky: ? ?  Status post hysterectomy ?  ?Endometrium ?  ?Surgically absent ?  ?Right ovary ?  ?Not visualized. ?  ?Left ovary ?  ?Not visualized ?  ?Other findings: There are 2 cystic areas containing diffuse ?low-level internal echoes associated with the vaginal cuff. The ?largest measures 5.9 x 3.5 x 6.5 cm. The other cystic area measures ?3.0 x 2.5 x 3.0 cm. ?IMPRESSION: ?1. There are 2, indeterminate cystic structures associated with the ?vaginal cuff in this patient who is status post hysterectomy. ?Although indeterminate, these findings may represent epidermal ?inclusion cyst which may be seen status post hysterectomy. More ?definitive characterization may be obtained with contrast enhanced ?pelvic MR. ?Assessment:   ?   ?1. Vaginal pain ?If pain increases or persist lets me know, can get IR to drain cyst  ? ?2. Pelvic pressure in female ? ?3. Tenderness of female pelvic organs ? ?4. S/P hysterectomy ? ?5. Vaginal atrophy ?Can try replens, or luvena  ?Did talk about estrogen cream as option ? ?6. Vaginal inclusion cyst ?Discussed with her that has adhesions and fluid trapped at top of vaginal cuff ?Dr Elonda Husky does not think MRI needed  ?Can get IR to drain cyst, but will probably return ?   ?Plan:  ?   ?Follow up prn  ?   ?

## 2021-07-18 ENCOUNTER — Ambulatory Visit (INDEPENDENT_AMBULATORY_CARE_PROVIDER_SITE_OTHER): Payer: PPO | Admitting: Family Medicine

## 2021-07-18 ENCOUNTER — Encounter: Payer: Self-pay | Admitting: Family Medicine

## 2021-07-18 VITALS — BP 138/74 | Ht 62.0 in | Wt 223.0 lb

## 2021-07-18 DIAGNOSIS — E1169 Type 2 diabetes mellitus with other specified complication: Secondary | ICD-10-CM

## 2021-07-18 DIAGNOSIS — M545 Low back pain, unspecified: Secondary | ICD-10-CM | POA: Diagnosis not present

## 2021-07-18 DIAGNOSIS — Z79891 Long term (current) use of opiate analgesic: Secondary | ICD-10-CM

## 2021-07-18 DIAGNOSIS — G609 Hereditary and idiopathic neuropathy, unspecified: Secondary | ICD-10-CM

## 2021-07-18 DIAGNOSIS — G8929 Other chronic pain: Secondary | ICD-10-CM

## 2021-07-18 DIAGNOSIS — E785 Hyperlipidemia, unspecified: Secondary | ICD-10-CM

## 2021-07-18 DIAGNOSIS — R7303 Prediabetes: Secondary | ICD-10-CM | POA: Diagnosis not present

## 2021-07-18 DIAGNOSIS — E876 Hypokalemia: Secondary | ICD-10-CM | POA: Diagnosis not present

## 2021-07-18 DIAGNOSIS — F439 Reaction to severe stress, unspecified: Secondary | ICD-10-CM

## 2021-07-18 DIAGNOSIS — R569 Unspecified convulsions: Secondary | ICD-10-CM

## 2021-07-18 MED ORDER — HYDROCODONE-ACETAMINOPHEN 10-325 MG PO TABS: ORAL_TABLET | ORAL | 0 refills | Status: DC

## 2021-07-18 MED ORDER — SERTRALINE HCL 100 MG PO TABS: ORAL_TABLET | ORAL | 1 refills | Status: DC

## 2021-07-18 NOTE — Patient Instructions (Signed)

## 2021-07-18 NOTE — Progress Notes (Signed)
? ?Subjective:  ? ? Patient ID: Deborah Carroll, female    DOB: 08-19-1955, 66 y.o.   MRN: [id number] ? ?HPI ? ?This patient was seen today for chronic pain ? ?The medication list was reviewed and updated. ? ?Location of Pain for which the patient has been treated with regarding narcotics: back pain ans neuropathy ? ?Onset of this pain: years ? ? -Compliance with medication: yes ? ?- Number patient states they take daily: 2-2.5 a day ? ?-when was the last dose patient took? today ? ?The patient was advised the importance of maintaining medication and not using illegal substances with these. ? ?Here for refills and follow up ? ?The patient was educated that we can provide 3 monthly scripts for their medication, it is their responsibility to follow the instructions. ? ?Side effects or complications from medications: none ? ?Patient is aware that pain medications are meant to minimize the severity of the pain to allow their pain levels to improve to allow for better function. They are aware of that pain medications cannot totally remove their pain. ? ?Due for UDT ( at least once per year) : 04/2021 ? ?Scale of 1 to 10 ( 1 is least 10 is most) ?Your pain level without the medicine: 11 ?Your pain level with medication 4 ? ?Scale 1 to 10 ( 1-helps very little, 10 helps very well) ?How well does your pain medication reduce your pain so you can function better through out the day? 9  ? ? ? ?  ? ? ? ?Review of Systems ? ?   ?Objective:  ? Physical Exam ?General-in no acute distress ?Eyes-no discharge ?Lungs-respiratory rate normal, CTA ?CV-no murmurs,RRR ?Extremities skin warm dry no edema ?Neuro grossly normal ?Behavior normal, alert ? ? ? ? ?   ?Assessment & Plan:  ?1. Stress ? patient had a lot of stress has brother who recently got out of jail who has drug issues, her mom is currently in skilled nursing and does not want to be there, puts a lot of stress on the patient she is already on antidepressants but I think she would  benefit from counseling ?- Ambulatory referral to Psychology ?Continue sertraline we will use 200 mg daily ?Xanax for sparing use caution drowsiness no driving ?2. Encounter for long-term opiate analgesic use ?The patient was seen in followup for chronic pain. ?A review over at their current pain status was discussed. Drug registry was checked. ?Prescriptions were given.  Regular follow-up recommended. ?Discussion was held regarding the importance of compliance with medication as well as pain medication contract. ? ?Patient was informed that medication may cause drowsiness and should not be combined  with other medications/alcohol or street drugs. If the patient feels medication is causing altered alertness then do not drive or operate dangerous equipment. ? ?Should be noted that the patient appears to be meeting appropriate use of opioids and response.  Evidenced by improved function and decent pain control without significant side effects and no evidence of overt aberrancy issues.  Upon discussion with the patient today they understand that opioid therapy is optional and they feel that the pain has been refractory to reasonable conservative measures and is significant and affecting quality of life enough to warrant ongoing therapy and wishes to continue opioids.  Refills were provided. ? this helps her with her neuropathic pain in her legs.  And her back pain.  We will continue this. ?- HYDROcodone-acetaminophen (NORCO) 10-325 MG tablet; Take 1 tab three times daily  as needed for pain  Dispense: 90 tablet; Refill: 0 ?- HYDROcodone-acetaminophen (NORCO) 10-325 MG tablet; Take 1 tab three times daily as needed for pain  Dispense: 90 tablet; Refill: 0 ?- HYDROcodone-acetaminophen (NORCO) 10-325 MG tablet; Take 1 tab three times daily as needed for pain  Dispense: 90 tablet; Refill: 0 ? ?3. Idiopathic peripheral neuropathy ? moderate neuropathy in both legs.  Causes a fair amount of pain and discomfort hydrocodone  does help ? ?4. Hyperlipidemia associated with type 2 diabetes mellitus ([place]) ? continue cholesterol medicine healthy diet ? ?5. Seizures ([place]) ? no seizures currently on medication continue medication ? ?6. Chronic bilateral low back pain without sciatica ? chronic back pain discomfort stretches were shown.  Continue current measures. ? ?7. Prediabetes ? healthy diet portion control regular activity ? ?8. Morbid obesity ([place]) ? portion control regular activity ? ?Repeat blood work on the potassium and kidney function. ?

## 2021-07-19 LAB — BASIC METABOLIC PANEL
BUN/Creatinine Ratio: 17 (ref 12–28)
BUN: 12 mg/dL (ref 8–27)
CO2: 27 mmol/L (ref 20–29)
Calcium: 9.7 mg/dL (ref 8.7–10.3)
Chloride: 100 mmol/L (ref 96–106)
Creatinine, Ser: 0.7 mg/dL (ref 0.57–1.00)
Glucose: 128 mg/dL — ABNORMAL HIGH (ref 70–99)
Potassium: 4.2 mmol/L (ref 3.5–5.2)
Sodium: 143 mmol/L (ref 134–144)
eGFR: 97 mL/min/{1.73_m2} (ref >59)

## 2021-07-19 LAB — MAGNESIUM: Magnesium: 1.9 mg/dL (ref 1.6–2.3)

## 2021-07-23 ENCOUNTER — Other Ambulatory Visit: Payer: Self-pay | Admitting: Family Medicine

## 2021-07-24 DIAGNOSIS — G4733 Obstructive sleep apnea (adult) (pediatric): Secondary | ICD-10-CM | POA: Diagnosis not present

## 2021-07-26 DIAGNOSIS — R739 Hyperglycemia, unspecified: Secondary | ICD-10-CM | POA: Diagnosis not present

## 2021-07-26 DIAGNOSIS — E663 Overweight: Secondary | ICD-10-CM | POA: Diagnosis not present

## 2021-07-26 DIAGNOSIS — L658 Other specified nonscarring hair loss: Secondary | ICD-10-CM | POA: Diagnosis not present

## 2021-07-26 DIAGNOSIS — R5382 Chronic fatigue, unspecified: Secondary | ICD-10-CM | POA: Diagnosis not present

## 2021-07-26 DIAGNOSIS — N926 Irregular menstruation, unspecified: Secondary | ICD-10-CM | POA: Diagnosis not present

## 2021-07-26 DIAGNOSIS — E6609 Other obesity due to excess calories: Secondary | ICD-10-CM | POA: Diagnosis not present

## 2021-08-01 DIAGNOSIS — G4733 Obstructive sleep apnea (adult) (pediatric): Secondary | ICD-10-CM | POA: Diagnosis not present

## 2021-08-14 ENCOUNTER — Ambulatory Visit: Payer: PPO | Admitting: Nurse Practitioner

## 2021-08-16 ENCOUNTER — Encounter: Payer: Self-pay | Admitting: Nurse Practitioner

## 2021-08-16 ENCOUNTER — Ambulatory Visit (INDEPENDENT_AMBULATORY_CARE_PROVIDER_SITE_OTHER): Payer: PPO | Admitting: Nurse Practitioner

## 2021-08-16 VITALS — BP 136/84 | HR 63 | Temp 98.3°F | Ht 62.0 in | Wt 226.0 lb

## 2021-08-16 DIAGNOSIS — R143 Flatulence: Secondary | ICD-10-CM

## 2021-08-16 DIAGNOSIS — N952 Postmenopausal atrophic vaginitis: Secondary | ICD-10-CM

## 2021-08-16 MED ORDER — SIMETHICONE 80 MG PO CHEW: 80.0000 mg | CHEWABLE_TABLET | Freq: Four times a day (QID) | ORAL | 0 refills | Status: DC | PRN

## 2021-08-16 NOTE — Progress Notes (Signed)
? ?  Subjective:  ? ? Patient ID: Deborah Carroll, female    DOB: 1955-09-23, 66 y.o.   MRN: [id number] ? ?HPI ? ?Follow up for pelvic and vaginal pain with intercourse.  Patient states that she met with GYN and they recommended interventional radiology guided aspiration of cyst if patient is interested.  Patient states that she rather think about her options at this time.  Patient states that pain is currently manageable. ? ?Patient is concerned about recent abdominal swelling , nausea.  Patient states sometimes she will blow to the point to where it is hard to get her shirt on.  Patient also admits to having a lot of gas and belching.  Patient denies any abdominal pain or cramping currently.  Patient denies fevers, body aches, chills, blood in stools, diarrhea, constipation. ? ? ?Review of Systems  ?Gastrointestinal:  Positive for nausea.  ?     Abdominal bloating, flatulence, belching, nausea  ?All other systems reviewed and are negative. ? ?   ?Objective:  ? Physical Exam ?Vitals reviewed.  ?Constitutional:   ?   General: She is not in acute distress. ?   Appearance: Normal appearance. She is normal weight. She is not ill-appearing, toxic-appearing or diaphoretic.  ?HENT:  ?   Head: Normocephalic and atraumatic.  ?Cardiovascular:  ?   Rate and Rhythm: Normal rate and regular rhythm.  ?   Pulses: Normal pulses.  ?   Heart sounds: Normal heart sounds. No murmur heard. ?Pulmonary:  ?   Effort: Pulmonary effort is normal. No respiratory distress.  ?   Breath sounds: Normal breath sounds. No wheezing.  ?Abdominal:  ?   General: Bowel sounds are normal. There is distension.  ?   Palpations: Abdomen is soft. There is no mass.  ?   Tenderness: There is no abdominal tenderness. There is no rebound.  ?   Hernia: No hernia is present.  ?Musculoskeletal:  ?   Comments: Grossly intact  ?Skin: ?   General: Skin is warm.  ?   Capillary Refill: Capillary refill takes less than 2 seconds.  ?Neurological:  ?   Mental Status: She is  alert.  ?   Comments: Grossly intact  ?Psychiatric:     ?   Mood and Affect: Mood normal.     ?   Behavior: Behavior normal.  ? ? ? ? ? ?   ?Assessment & Plan:  ? ?1. Flatulence ?-Patient encouraged to keep a food diary to see if there are any medications that cause her bloating and increased gas ?-Patient also given information about FODMAP and encouraged to avoid gas producing foods if if possible ?-If patient continues to have symptoms we will consider doing allergy testing   ?-Return to clinic if symptoms persist or worsen ? ?2.  Vaginal atrophy ?-Patient encouraged to use Replens for vaginal dryness ?-Patient to notify provider if she would like to go through with interventional radiology knee aspiration of her vaginal cyst ? ?  ?Note:  This document was prepared using Dragon voice recognition software and may include unintentional dictation errors. ?Note - This record has been created using [employer].  ?Chart creation errors have been sought, but may not always  ?have been located. Such creation errors do not reflect on  ?the standard of medical care. ? ? ?

## 2021-08-16 NOTE — Patient Instructions (Signed)
Discussion of GLP-1 medications Please remember these medications are to assist with weight reduction and diabetes control.  They do not take the place of healthy eating and regular physical activity. There are several GLP-1 medications that can help with weight reduction and diabetes control.  GLP-1 medications with indication for diabetes-Trulicity, Victoza, Bydureon , Mounjaro, Ozempic, and Rybelsus (although these are injected medicines except for Rybelsus which is a pill) GLP-1 medications with indications for weight loss-Wegovy, and Saxenda  Mechanism of action  These medications stimulate glucagon peptide receptors.  By doing so it does the following:  1-slows down stomach emptying-essentially food takes longer to go through the stomach and the intestines-this can lessen appetite  2-reduces glucagon secretion from the pancreas-this helps keep blood glucose levels stable between meals  3-increase his insulin release from the pancreas-with diabetics this helps keep blood glucose levels stable  4-promotes the feeling of being full in the brain-encouraged him receptors in the brain received the signal so the brain and body know that it is time to stop eating Benefits of the medication  1-reduced weight-reduction of weight is more significant at higher dosing.  Not as much weight reduction with Rybelsus   A- typically Wegovy at higher doses can assist with significant weight reduction.  2-improved blood glucose levels-with diabetics typically we see a significant drop with A1c when the medication is adjusted appropriately.  3-decrease risk of heart attacks and strokes-individuals with type 2 diabetes are at increased risk of heart attacks and strokes.  These GLP-1 medications can reduce the risk by 22%.  Risk of GLP-1 medications-these are some of the risks.  It is important to talk with your provider in a shared discussion before starting any medication.  Contraindications to GLP-1  medications-in other words who should not take these.  1-individuals with history of thyroid medullary cancer  2-individuals with family history of multiple endocrine neoplasm syndrome type II (MEN 2)  3-individuals with a history of pancreatitis  4-those with a history of severe hypersensitivity or allergy reactions to GLP-1 medications  5-should be avoided in individuals with a history of suicidal attempts or active suicidal ideation.  Cautions include- 1- risk of thyroid C-cell tumors were seen in rats during clinical testing in humans the relevancy of this information has not been determined 2-risk of pancreatitis including fatal and nonfatal hemorrhagic or necrotizing pancreatitis 3-gallbladder disease slightly increased risk of gallstones 4-hypoglycemia-more common with individuals who are on insulin or sulfonylurea such as glipizide 5-acute kidney injury or worsening of chronic renal failure often this is triggered by severe vomiting and diarrhea as side effects to GLP-1. 6-slightly increased risk of diabetic retinopathy complications in patients with type 2 diabetes 7-heart rate increase-slight increase of base heart rate with GLP-1 8-suicidal behavior and ideation has been reported in clinical trials with other weight loss medicines.  If depression occurs with medication or suicidal ideation stop medication immediately notify provider or seek help.  Common side effects include nausea, vomiting, diarrhea, constipation and increased heart rate.  Less common side effects severe abdominal pain-if this occurs notify provider stop medication get tested for pancreatitis. Full discussion with your provider should occur before starting these medicines.       

## 2021-08-31 DIAGNOSIS — G4733 Obstructive sleep apnea (adult) (pediatric): Secondary | ICD-10-CM | POA: Diagnosis not present

## 2021-09-19 ENCOUNTER — Telehealth: Payer: Self-pay

## 2021-09-19 ENCOUNTER — Other Ambulatory Visit: Payer: Self-pay | Admitting: Internal Medicine

## 2021-09-19 MED ORDER — PANTOPRAZOLE SODIUM 40 MG PO TBEC: 40.0000 mg | DELAYED_RELEASE_TABLET | Freq: Every day | ORAL | 3 refills | Status: DC

## 2021-09-19 NOTE — Telephone Encounter (Signed)
Sent to pharmacy 

## 2021-09-19 NOTE — Telephone Encounter (Signed)
Refill request was received via fax for Pantoprazole 4 mg tablets, qty:90 to be sent to [facility]. Pt's last ov was on 07/12/20

## 2021-09-24 ENCOUNTER — Other Ambulatory Visit: Payer: Self-pay | Admitting: Nurse Practitioner

## 2021-09-24 ENCOUNTER — Telehealth: Payer: Self-pay

## 2021-09-24 DIAGNOSIS — E785 Hyperlipidemia, unspecified: Secondary | ICD-10-CM

## 2021-09-24 MED ORDER — ROSUVASTATIN CALCIUM 5 MG PO TABS: ORAL_TABLET | ORAL | 1 refills | Status: DC

## 2021-09-24 NOTE — Telephone Encounter (Signed)
Patient informed rx was sent in . Verbalized understanding.

## 2021-09-24 NOTE — Telephone Encounter (Signed)
Encourage patient to contact the pharmacy for refills or they can request refills through [facility]  (Please schedule appointment if patient has not been seen in over a year)    WHAT Branch THIS SENT TO: [place], [place]. [address]  [place] [id number]   MEDICATION NAME & DOSE:rosuvastatin (CRESTOR) 5 MG tablet   NOTES/COMMENTS FROM PATIENT:      [facility] office please notify patient: It takes 48-72 hours to process rx refill requests Ask patient to call pharmacy to ensure rx is ready before heading there.

## 2021-10-01 DIAGNOSIS — G4733 Obstructive sleep apnea (adult) (pediatric): Secondary | ICD-10-CM | POA: Diagnosis not present

## 2021-10-08 ENCOUNTER — Other Ambulatory Visit: Payer: Self-pay | Admitting: Family Medicine

## 2021-10-16 ENCOUNTER — Encounter: Payer: Self-pay | Admitting: *Deleted

## 2021-10-17 ENCOUNTER — Ambulatory Visit (INDEPENDENT_AMBULATORY_CARE_PROVIDER_SITE_OTHER): Payer: PPO | Admitting: Family Medicine

## 2021-10-17 VITALS — BP 133/82 | HR 75 | Temp 97.8°F | Ht 62.0 in | Wt 228.2 lb

## 2021-10-17 DIAGNOSIS — R251 Tremor, unspecified: Secondary | ICD-10-CM | POA: Diagnosis not present

## 2021-10-17 DIAGNOSIS — E785 Hyperlipidemia, unspecified: Secondary | ICD-10-CM

## 2021-10-17 DIAGNOSIS — G609 Hereditary and idiopathic neuropathy, unspecified: Secondary | ICD-10-CM

## 2021-10-17 DIAGNOSIS — Z79891 Long term (current) use of opiate analgesic: Secondary | ICD-10-CM | POA: Diagnosis not present

## 2021-10-17 DIAGNOSIS — E119 Type 2 diabetes mellitus without complications: Secondary | ICD-10-CM

## 2021-10-17 DIAGNOSIS — E1169 Type 2 diabetes mellitus with other specified complication: Secondary | ICD-10-CM

## 2021-10-17 MED ORDER — HYDROCODONE-ACETAMINOPHEN 10-325 MG PO TABS: ORAL_TABLET | ORAL | 0 refills | Status: DC

## 2021-10-17 MED ORDER — PROPRANOLOL HCL 10 MG PO TABS: 10.0000 mg | ORAL_TABLET | Freq: Two times a day (BID) | ORAL | 4 refills | Status: DC

## 2021-10-17 NOTE — Patient Instructions (Addendum)
Discussion of GLP-1 medications Weight loss GLP1 include Saxenda and Wegovy Please remember these medications are to assist with weight reduction and diabetes control.  They do not take the place of healthy eating and regular physical activity. There are several GLP-1 medications that can help with weight reduction and diabetes control.  GLP-1 medications with indication for diabetes-Trulicity, Victoza, Bydureon , Mounjaro, Ozempic, and Rybelsus (although these are injected medicines except for Rybelsus which is a pill) GLP-1 medications with indications for weight loss-Wegovy, and Saxenda  Mechanism of action  These medications stimulate glucagon peptide receptors.  By doing so it does the following:  1-slows down stomach emptying-essentially food takes longer to go through the stomach and the intestines-this can lessen appetite  2-reduces glucagon secretion from the pancreas-this helps keep blood glucose levels stable between meals  3-increase his insulin release from the pancreas-with diabetics this helps keep blood glucose levels stable  4-promotes the feeling of being full in the brain-encouraged him receptors in the brain received the signal so the brain and body know that it is time to stop eating Benefits of the medication  1-reduced weight-reduction of weight is more significant at higher dosing.  Not as much weight reduction with Rybelsus   A- typically Wegovy at higher doses can assist with significant weight reduction.  2-improved blood glucose levels-with diabetics typically we see a significant drop with A1c when the medication is adjusted appropriately.  3-decrease risk of heart attacks and strokes-individuals with type 2 diabetes are at increased risk of heart attacks and strokes.  These GLP-1 medications can reduce the risk by 22%.  Risk of GLP-1 medications-these are some of the risks.  It is important to talk with your provider in a shared discussion before starting any  medication.  Contraindications to GLP-1 medications-in other words who should not take these.  1-individuals with history of thyroid medullary cancer  2-individuals with family history of multiple endocrine neoplasm syndrome type II (MEN 2)  3-individuals with a history of pancreatitis  4-those with a history of severe hypersensitivity or allergy reactions to GLP-1 medications  5-should be avoided in individuals with a history of suicidal attempts or active suicidal ideation.  Cautions include- 1- risk of thyroid C-cell tumors were seen in rats during clinical testing in humans the relevancy of this information has not been determined 2-risk of pancreatitis including fatal and nonfatal hemorrhagic or necrotizing pancreatitis 3-gallbladder disease slightly increased risk of gallstones 4-hypoglycemia-more common with individuals who are on insulin or sulfonylurea such as glipizide 5-acute kidney injury or worsening of chronic renal failure often this is triggered by severe vomiting and diarrhea as side effects to GLP-1. 6-slightly increased risk of diabetic retinopathy complications in patients with type 2 diabetes 7-heart rate increase-slight increase of base heart rate with GLP-1 8-suicidal behavior and ideation has been reported in clinical trials with other weight loss medicines.  If depression occurs with medication or suicidal ideation stop medication immediately notify provider or seek help.  Common side effects include nausea, vomiting, diarrhea, constipation and increased heart rate.  Less common side effects severe abdominal pain-if this occurs notify provider stop medication get tested for pancreatitis. Full discussion with your provider should occur before starting these medicines.             Shingrix and shingles prevention: know the facts!   Shingrix is a very effective vaccine to prevent shingles.   Shingles is a reactivation of chickenpox -more than 99% of Americans  born before 55 have  had chickenpox even if they do not remember it. One in every 10 people who get shingles have severe long-lasting nerve pain as a result.   33 out of a 100 older adults will get shingles if they are unvaccinated.     This vaccine is very important for your health This vaccine is indicated for anyone 50 years or older. You can get this vaccine even if you have already had shingles because you can get the disease more than once in a lifetime.  Your risk for shingles and its complications increases with age.  This vaccine has 2 doses.  The second dose would be 2 to 6 months after the first dose.  If you had Zostavax vaccine in the past you should still get Shingrix. ( Zostavax is only 70% effective and it loses significant strength over a few years .)  This vaccine is given through the pharmacy.  The cost of the vaccine is through your insurance. The pharmacy can inform you of the total costs.  Common side effects including soreness in the arm, some redness and swelling, also some feel fatigue muscle soreness headache low-grade fever.  Side effects typically go away within 2 to 3 days. Remember-the pain from shingles can last a lifetime but these side effects of the vaccine will only last a few days at most. It is very important to get both doses in order to protect yourself fully.   Please get this vaccine at your earliest convenience at your trusted pharmacy.

## 2021-10-17 NOTE — Progress Notes (Signed)
Subjective:    Patient ID: Deborah Carroll, female    DOB: Feb 13, 1956, 66 y.o.   MRN: [id number]  HPI  Patient here for follow up on pain medication.  This patient was seen today for chronic pain  The medication list was reviewed and updated.  Location of Pain for which the patient has been treated with regarding narcotics: She has burning in the feet from neuropathy along with chronic back pain primarily takes the pain medicine for her feet and neuropathy  Onset of this pain: Present for years   -Compliance with medication: Good compliance  - Number patient states they take daily: Some days 1 or 2 or 3  -when was the last dose patient took?  Earlier today  The patient was advised the importance of maintaining medication and not using illegal substances with these.  Here for refills and follow up  The patient was educated that we can provide 3 monthly scripts for their medication, it is their responsibility to follow the instructions.  Side effects or complications from medications: Denies side effects  Patient is aware that pain medications are meant to minimize the severity of the pain to allow their pain levels to improve to allow for better function. They are aware of that pain medications cannot totally remove their pain.  Due for UDT ( at least once per year) : January 2023  Scale of 1 to 10 ( 1 is least 10 is most) Your pain level without the medicine: 8 Your pain level with medication 4  Scale 1 to 10 ( 1-helps very little, 10 helps very well) How well does your pain medication reduce your pain so you can function better through out the day? 7  Quality of the pain: Throbbing aching pain burning  Persistence of the pain: Persistent all the time  Modifying factors: Worse with activity better with medication  Saxenda , wegovy-patient has concerns about her morbid obesity she tries to watch her diet tries to do some exercise cannot do much exercise is interested in  GLP-1's we did discuss these today  tremors-she has a personal history of tremors that started up over the past year been getting progressively worse also has a family history of tremors her mother had severe tremors of the whole body but started off in the hands.  Patient's tremor started off in her hands    Review of Systems     Objective:   Physical Exam Fine tremors noted in the hands negative cogwheel lungs clear heart regular extremities no edema skin warm dry Neuropathy noted in both feet to monofilament and to positional sense       Assessment & Plan:  1. Hyperlipidemia associated with type 2 diabetes mellitus (HCC) Continue statin cholesterol profile looks good - TSH - T4, Free - T3 - Basic Metabolic Panel - Hemoglobin A1C - Hepatic function panel  2. Idiopathic peripheral neuropathy More than likely this is related to diabetes but could also be due to other reasons continue pain medicine as needed.  Vitamin B12 was negative in the past or normal-reasonable to have a neurology consult - TSH - T4, Free - T3 - Basic Metabolic Panel - Hemoglobin A1C - Hepatic function panel  3. Diabetes mellitus without complication ([place]) Diabetes continue current measures healthy diet recommended - TSH - T4, Free - T3 - Basic Metabolic Panel - Hemoglobin A1C - Hepatic function panel  4. Encounter for long-term opiate analgesic use The patient was seen in followup for  chronic pain. A review over at their current pain status was discussed. Drug registry was checked. Prescriptions were given.  Regular follow-up recommended. Discussion was held regarding the importance of compliance with medication as well as pain medication contract.  Patient was informed that medication may cause drowsiness and should not be combined  with other medications/alcohol or street drugs. If the patient feels medication is causing altered alertness then do not drive or operate dangerous  equipment.  Should be noted that the patient appears to be meeting appropriate use of opioids and response.  Evidenced by improved function and decent pain control without significant side effects and no evidence of overt aberrancy issues.  Upon discussion with the patient today they understand that opioid therapy is optional and they feel that the pain has been refractory to reasonable conservative measures and is significant and affecting quality of life enough to warrant ongoing therapy and wishes to continue opioids.  Refills were provided.  - TSH - T4, Free - T3 - Basic Metabolic Panel - Hemoglobin A1C - Hepatic function panel - HYDROcodone-acetaminophen (NORCO) 10-325 MG tablet; Take 1 tab three times daily as needed for pain  Dispense: 90 tablet; Refill: 0 - HYDROcodone-acetaminophen (NORCO) 10-325 MG tablet; Take 1 tab three times daily as needed for pain  Dispense: 90 tablet; Refill: 0 - HYDROcodone-acetaminophen (NORCO) 10-325 MG tablet; Take 1 tab three times daily as needed for pain  Dispense: 90 tablet; Refill: 0  5. Tremor Fine tremor noted in the hands-propranolol twice daily should help out with this See neurology for further evaluation and input - TSH - T4, Free - T3 - Basic Metabolic Panel - Hemoglobin A1C - Hepatic function panel - Ambulatory referral to Neurology  6. Morbid obesity ([place]) GLP-1 medications discussed unlikely to be covered by her insurance Side effects discussed Healthy diet regular physical activity discussed  Follow-up 3 months

## 2021-10-18 LAB — BASIC METABOLIC PANEL
BUN/Creatinine Ratio: 13 (ref 12–28)
BUN: 10 mg/dL (ref 8–27)
CO2: 24 mmol/L (ref 20–29)
Calcium: 8.9 mg/dL (ref 8.7–10.3)
Chloride: 98 mmol/L (ref 96–106)
Creatinine, Ser: 0.78 mg/dL (ref 0.57–1.00)
Glucose: 144 mg/dL — ABNORMAL HIGH (ref 70–99)
Potassium: 3.1 mmol/L — ABNORMAL LOW (ref 3.5–5.2)
Sodium: 138 mmol/L (ref 134–144)
eGFR: 85 mL/min/{1.73_m2} (ref >59)

## 2021-10-18 LAB — HEPATIC FUNCTION PANEL
ALT: 21 IU/L (ref 0–32)
AST: 29 IU/L (ref 0–40)
Albumin: 4.4 g/dL (ref 3.9–4.9)
Alkaline Phosphatase: 137 IU/L — ABNORMAL HIGH (ref 44–121)
Bilirubin Total: <0.2 mg/dL (ref 0.0–1.2)
Bilirubin, Direct: <0.1 mg/dL (ref 0.00–0.40)
Total Protein: 7 g/dL (ref 6.0–8.5)

## 2021-10-18 LAB — HEMOGLOBIN A1C
Est. average glucose Bld gHb Est-mCnc: 134 mg/dL
Hgb A1c MFr Bld: 6.3 % — ABNORMAL HIGH (ref 4.8–5.6)

## 2021-10-18 LAB — T3: T3, Total: 90 ng/dL (ref 71–180)

## 2021-10-18 LAB — TSH: TSH: 1.76 u[IU]/mL (ref 0.450–4.500)

## 2021-10-18 LAB — T4, FREE: Free T4: 0.96 ng/dL (ref 0.82–1.77)

## 2021-10-21 ENCOUNTER — Other Ambulatory Visit: Payer: Self-pay | Admitting: Family Medicine

## 2021-10-22 ENCOUNTER — Other Ambulatory Visit: Payer: Self-pay | Admitting: Family Medicine

## 2021-10-22 DIAGNOSIS — E876 Hypokalemia: Secondary | ICD-10-CM

## 2021-10-24 DIAGNOSIS — G4733 Obstructive sleep apnea (adult) (pediatric): Secondary | ICD-10-CM | POA: Diagnosis not present

## 2021-10-25 ENCOUNTER — Other Ambulatory Visit: Payer: Self-pay | Admitting: Family Medicine

## 2021-10-31 DIAGNOSIS — G4733 Obstructive sleep apnea (adult) (pediatric): Secondary | ICD-10-CM | POA: Diagnosis not present

## 2021-11-15 DIAGNOSIS — H6123 Impacted cerumen, bilateral: Secondary | ICD-10-CM | POA: Diagnosis not present

## 2021-11-15 DIAGNOSIS — E876 Hypokalemia: Secondary | ICD-10-CM | POA: Diagnosis not present

## 2021-11-15 DIAGNOSIS — Z974 Presence of external hearing-aid: Secondary | ICD-10-CM | POA: Diagnosis not present

## 2021-11-15 DIAGNOSIS — H903 Sensorineural hearing loss, bilateral: Secondary | ICD-10-CM | POA: Insufficient documentation

## 2021-11-16 LAB — MAGNESIUM: Magnesium: 2.3 mg/dL (ref 1.6–2.3)

## 2021-11-16 LAB — POTASSIUM: Potassium: 3.9 mmol/L (ref 3.5–5.2)

## 2021-11-20 ENCOUNTER — Other Ambulatory Visit: Payer: Self-pay

## 2021-11-20 MED ORDER — METFORMIN HCL ER 500 MG PO TB24: ORAL_TABLET | ORAL | 1 refills | Status: DC

## 2021-12-01 DIAGNOSIS — G4733 Obstructive sleep apnea (adult) (pediatric): Secondary | ICD-10-CM | POA: Diagnosis not present

## 2021-12-04 ENCOUNTER — Ambulatory Visit (INDEPENDENT_AMBULATORY_CARE_PROVIDER_SITE_OTHER): Payer: PPO

## 2021-12-04 VITALS — Wt 228.0 lb

## 2021-12-04 DIAGNOSIS — Z0000 Encounter for general adult medical examination without abnormal findings: Secondary | ICD-10-CM | POA: Diagnosis not present

## 2021-12-04 DIAGNOSIS — Z780 Asymptomatic menopausal state: Secondary | ICD-10-CM

## 2021-12-04 DIAGNOSIS — Z1211 Encounter for screening for malignant neoplasm of colon: Secondary | ICD-10-CM

## 2021-12-04 NOTE — Patient Instructions (Signed)
Deborah Carroll , Thank you for taking time to come for your Medicare Wellness Visit. I appreciate your ongoing commitment to your health goals. Please review the following plan we discussed and let me know if I can assist you in the future.   Screening recommendations/referrals: Colonoscopy: 11/17/18, referral sent Mammogram: 04/18/21 Bone Density: referral sent Recommended yearly ophthalmology/optometry visit for glaucoma screening and checkup Recommended yearly dental visit for hygiene and checkup  Vaccinations: Influenza vaccine: n/d Pneumococcal vaccine: 08/07/20 Tdap vaccine: n/d Shingles vaccine: n/d   Covid-19:n/d  Advanced directives: no  Conditions/risks identified: none  Next appointment: Follow up in one year for your annual wellness visit 12/10/22 @ 1:30 pm by phone   Preventive Care 65 Years and Older, Female Preventive care refers to lifestyle choices and visits with your health care provider that can promote health and wellness. What does preventive care include? A yearly physical exam. This is also called an annual well check. Dental exams once or twice a year. Routine eye exams. Ask your health care provider how often you should have your eyes checked. Personal lifestyle choices, including: Daily care of your teeth and gums. Regular physical activity. Eating a healthy diet. Avoiding tobacco and drug use. Limiting alcohol use. Practicing safe sex. Taking low-dose aspirin every day. Taking vitamin and mineral supplements as recommended by your health care provider. What happens during an annual well check? The services and screenings done by your health care provider during your annual well check will depend on your age, overall health, lifestyle risk factors, and family history of disease. Counseling  Your health care provider may ask you questions about your: Alcohol use. Tobacco use. Drug use. Emotional well-being. Home and relationship well-being. Sexual  activity. Eating habits. History of falls. Memory and ability to understand (cognition). Work and work Statistician. Reproductive health. Screening  You may have the following tests or measurements: Height, weight, and BMI. Blood pressure. Lipid and cholesterol levels. These may be checked every 5 years, or more frequently if you are over 63 years old. Skin check. Lung cancer screening. You may have this screening every year starting at age 38 if you have a 30-pack-year history of smoking and currently smoke or have quit within the past 15 years. Fecal occult blood test (FOBT) of the stool. You may have this test every year starting at age 6. Flexible sigmoidoscopy or colonoscopy. You may have a sigmoidoscopy every 5 years or a colonoscopy every 10 years starting at age 22. Hepatitis C blood test. Hepatitis B blood test. Sexually transmitted disease (STD) testing. Diabetes screening. This is done by checking your blood sugar (glucose) after you have not eaten for a while (fasting). You may have this done every 1-3 years. Bone density scan. This is done to screen for osteoporosis. You may have this done starting at age 46. Mammogram. This may be done every 1-2 years. Talk to your health care provider about how often you should have regular mammograms. Talk with your health care provider about your test results, treatment options, and if necessary, the need for more tests. Vaccines  Your health care provider may recommend certain vaccines, such as: Influenza vaccine. This is recommended every year. Tetanus, diphtheria, and acellular pertussis (Tdap, Td) vaccine. You may need a Td booster every 10 years. Zoster vaccine. You may need this after age 3. Pneumococcal 13-valent conjugate (PCV13) vaccine. One dose is recommended after age 9. Pneumococcal polysaccharide (PPSV23) vaccine. One dose is recommended after age 11. Talk to your health  care provider about which screenings and vaccines  you need and how often you need them. This information is not intended to replace advice given to you by your health care provider. Make sure you discuss any questions you have with your health care provider. Document Released: 04/21/2015 Document Revised: 12/13/2015 Document Reviewed: 01/24/2015 Elsevier Interactive Patient Education  2017 [employer] Prevention in the Home Falls can cause injuries. They can happen to people of all ages. There are many things you can do to make your home safe and to help prevent falls. What can I do on the outside of my home? Regularly fix the edges of walkways and driveways and fix any cracks. Remove anything that might make you trip as you walk through a door, such as a raised step or threshold. Trim any bushes or trees on the path to your home. Use bright outdoor lighting. Clear any walking paths of anything that might make someone trip, such as rocks or tools. Regularly check to see if handrails are loose or broken. Make sure that both sides of any steps have handrails. Any raised decks and porches should have guardrails on the edges. Have any leaves, snow, or ice cleared regularly. Use sand or salt on walking paths during winter. Clean up any spills in your garage right away. This includes oil or grease spills. What can I do in the bathroom? Use night lights. Install grab bars by the toilet and in the tub and shower. Do not use towel bars as grab bars. Use non-skid mats or decals in the tub or shower. If you need to sit down in the shower, use a plastic, non-slip stool. Keep the floor dry. Clean up any water that spills on the floor as soon as it happens. Remove soap buildup in the tub or shower regularly. Attach bath mats securely with double-sided non-slip rug tape. Do not have throw rugs and other things on the floor that can make you trip. What can I do in the bedroom? Use night lights. Make sure that you have a light by your bed that  is easy to reach. Do not use any sheets or blankets that are too big for your bed. They should not hang down onto the floor. Have a firm chair that has side arms. You can use this for support while you get dressed. Do not have throw rugs and other things on the floor that can make you trip. What can I do in the kitchen? Clean up any spills right away. Avoid walking on wet floors. Keep items that you use a lot in easy-to-reach places. If you need to reach something above you, use a strong step stool that has a grab bar. Keep electrical cords out of the way. Do not use floor polish or wax that makes floors slippery. If you must use wax, use non-skid floor wax. Do not have throw rugs and other things on the floor that can make you trip. What can I do with my stairs? Do not leave any items on the stairs. Make sure that there are handrails on both sides of the stairs and use them. Fix handrails that are broken or loose. Make sure that handrails are as long as the stairways. Check any carpeting to make sure that it is firmly attached to the stairs. Fix any carpet that is loose or worn. Avoid having throw rugs at the top or bottom of the stairs. If you do have throw rugs, attach them to  the floor with carpet tape. Make sure that you have a light switch at the top of the stairs and the bottom of the stairs. If you do not have them, ask someone to add them for you. What else can I do to help prevent falls? Wear shoes that: Do not have high heels. Have rubber bottoms. Are comfortable and fit you well. Are closed at the toe. Do not wear sandals. If you use a stepladder: Make sure that it is fully opened. Do not climb a closed stepladder. Make sure that both sides of the stepladder are locked into place. Ask someone to hold it for you, if possible. Clearly mark and make sure that you can see: Any grab bars or handrails. First and last steps. Where the edge of each step is. Use tools that help you  move around (mobility aids) if they are needed. These include: Canes. Walkers. Scooters. Crutches. Turn on the lights when you go into a dark area. Replace any light bulbs as soon as they burn out. Set up your furniture so you have a clear path. Avoid moving your furniture around. If any of your floors are uneven, fix them. If there are any pets around you, be aware of where they are. Review your medicines with your doctor. Some medicines can make you feel dizzy. This can increase your chance of falling. Ask your doctor what other things that you can do to help prevent falls. This information is not intended to replace advice given to you by your health care provider. Make sure you discuss any questions you have with your health care provider. Document Released: 01/19/2009 Document Revised: 08/31/2015 Document Reviewed: 04/29/2014 Elsevier Interactive Patient Education  2017 [employer].

## 2021-12-04 NOTE — Progress Notes (Signed)
Virtual Visit via Telephone Note  I connected with  Elsie Ra on 12/04/21 at  3:15 PM EDT by telephone and verified that I am speaking with the correct person using two identifiers.  Location: Patient: home Provider: [facility] Persons participating in the virtual visit: patient/Nurse Health Advisor   I discussed the limitations, risks, security and privacy concerns of performing an evaluation and management service by telephone and the availability of in person appointments. The patient expressed understanding and agreed to proceed.  Interactive audio and video telecommunications were attempted between this nurse and patient, however failed, due to patient having technical difficulties OR patient did not have access to video capability.  We continued and completed visit with audio only.  Some vital signs may be absent or patient reported.   Dionisio David, LPN  Subjective:   NONNA RENNINGER is a 66 y.o. female who presents for Medicare Annual (Subsequent) preventive examination.  Review of Systems     Cardiac Risk Factors include: advanced age (>59mn, >>3women);hypertension;dyslipidemia;diabetes mellitus     Objective:    There were no vitals filed for this visit. There is no height or weight on file to calculate BMI.     12/04/2021    3:14 PM 10/10/2020    3:09 PM 05/23/2020    7:19 AM 05/19/2020    9:11 AM 03/20/2020    3:50 PM 11/17/2018    6:50 AM 11/13/2018   10:07 AM  Advanced Directives  Does Patient Have a Medical Advance Directive? No No No No No No No  Would patient like information on creating a medical advance directive? No - Patient declined No - Patient declined No - Patient declined No - Patient declined No - Patient declined No - Patient declined No - Patient declined    Current Medications (verified) Outpatient Encounter Medications as of 12/04/2021  Medication Sig   ALPRAZolam (XANAX) 0.5 MG tablet 1/2 tablet to a full tablet for severe anxiousness use  sparingly not with hydrocodone   dicyclomine (BENTYL) 10 MG capsule Take 10 mg by mouth every morning.   hydrochlorothiazide (HYDRODIURIL) 25 MG tablet TAKE 1 TABLET(25 MG) BY MOUTH DAILY   HYDROcodone-acetaminophen (NORCO) 10-325 MG tablet Take 1 tab three times daily as needed for pain   metFORMIN (GLUCOPHAGE-XR) 500 MG 24 hr tablet TAKE 1 TABLET(500 MG) BY MOUTH DAILY WITH BREAKFAST   pantoprazole (PROTONIX) 40 MG tablet Take 1 tablet (40 mg total) by mouth daily.   phenytoin (DILANTIN) 100 MG ER capsule TAKE 2 CAPSULES BY MOUTH EVERY MORNING AND 2 CAPSULES BY MOUTH EVERY EVENING   potassium chloride SA (KLOR-CON M) 20 MEQ tablet Take 1 tablet (20 mEq total) by mouth 2 (two) times daily.   propranolol (INDERAL) 10 MG tablet Take 1 tablet (10 mg total) by mouth 2 (two) times daily. Take 1 tablet (10 mg total) by mouth 2 (two) times daily for tremor   rosuvastatin (CRESTOR) 5 MG tablet TAKE 1 TABLET(5 MG) BY MOUTH DAILY   sertraline (ZOLOFT) 100 MG tablet TAKE 2 TABLETS BY MOUTH DAILY   simethicone (GAS-X) 80 MG chewable tablet Chew 1 tablet (80 mg total) by mouth every 6 (six) hours as needed for flatulence.   torsemide (DEMADEX) 20 MG tablet Take 1 tablet (20 mg total) by mouth daily as needed (fluid or swelling).   [DISCONTINUED] HYDROcodone-acetaminophen (NORCO) 10-325 MG tablet Take 1 tab three times daily as needed for pain   [DISCONTINUED] HYDROcodone-acetaminophen (NORCO) 10-325 MG tablet Take 1  tab three times daily as needed for pain   No facility-administered encounter medications on file as of 12/04/2021.    Allergies (verified) Patient has no known allergies.   History: Past Medical History:  Diagnosis Date   Anxiety    Chronic pain    Depression    Diabetes mellitus without complication (HCC)    GERD (gastroesophageal reflux disease)    Hypertension    Morbid obesity ([place]) 03/20/2018   Neuropathy    Peripheral neuropathy    Peripheral neuropathy    Seizures ([place])     last one 1992- Increased BP with delivery and is on meds still.   Sleep apnea    CPAP x 15 years   Past Surgical History:  Procedure Laterality Date   ABDOMINAL HYSTERECTOMY     ovaries present, no cancer,approx 2001   BACK SURGERY     BIOPSY  05/23/2020   Procedure: BIOPSY;  Surgeon: Eloise Harman, DO;  [facility];  Service: Endoscopy;;   CARPAL TUNNEL RELEASE     Bilateral   CATARACT EXTRACTION W/PHACO Right 06/20/2015   Procedure: CATARACT EXTRACTION PHACO AND INTRAOCULAR LENS PLACEMENT (IOC);  Surgeon: Rutherford Guys, MD;  Location: [facility];  Service: Ophthalmology;  Laterality: Right;  CDE:6.25   CATARACT EXTRACTION W/PHACO Left 08/29/2015   Procedure: CATARACT EXTRACTION PHACO AND INTRAOCULAR LENS PLACEMENT (IOC);  Surgeon: Rutherford Guys, MD;  Location: [facility];  Service: Ophthalmology;  Laterality: Left;  CDE: 4.11   CESAREAN SECTION     x2   CHOLECYSTECTOMY     COLONOSCOPY  10/14/2002   ELF:YBOFBPZWCH involving descending colon, splenic flexure and distal transverse colon with most extensive inflammation in the splenic flexure area.  These changes are typical of ischemic colitis.  Biopsy taken.   COLONOSCOPY N/A 08/04/2012   SLF: POLYPOID LESION IN AT THE APPENDICEAL ORIFICE/Polyp  in the descending colon/Moderate melanosis throughout the entire examined colon/ The colon IS redundant. small tubular adenoma.    COLONOSCOPY WITH PROPOFOL N/A 11/17/2018   3 tubular adenomas removed, 3 hyperplastic polyps, next colonoscopy in 11/2021.    ESOPHAGOGASTRODUODENOSCOPY (EGD) WITH PROPOFOL N/A 05/23/2020   Procedure: ESOPHAGOGASTRODUODENOSCOPY (EGD) WITH PROPOFOL;  Surgeon: Eloise Harman, DO;  [facility];  Service: Endoscopy;  Laterality: N/A;  11:45am   FOOT SURGERY Bilateral 2004   hammer toe repairs   PARS PLANA VITRECTOMY W/ REPAIR OF MACULAR HOLE     POLYPECTOMY  11/17/2018   Procedure: POLYPECTOMY;  Surgeon: Danie Binder, MD;  [facility];   Service: Endoscopy;;   POLYPECTOMY  05/23/2020   Procedure: POLYPECTOMY;  Surgeon: Eloise Harman, DO;  [facility];  Service: Endoscopy;;   Family History  Problem Relation Age of Onset   Hypertension Maternal Grandmother    Heart attack Maternal Grandfather    Emphysema Father    Asthma Father    Colon polyps Mother 55       benign    Colon cancer Neg Hx    Liver disease Neg Hx    Social History   Socioeconomic History   Marital status: Married    Spouse name: Not on file   Number of children: 2   Years of education: 12   Highest education level: Not on file  Occupational History   Occupation: disability  Tobacco Use   Smoking status: Never   Smokeless tobacco: Never   Tobacco comments:    smoked x 4 years many years ago  Vaping Use   Vaping Use: Never used  Substance and Sexual Activity   Alcohol use: Not Currently   Drug use: No   Sexual activity: Yes    Birth control/protection: Surgical    Comment: hyst  Other Topics Concern   Not on file  Social History Narrative   lives with husband   Social Determinants of Health   Financial Resource Strain: Low Risk  (12/04/2021)   Overall Financial Resource Strain (CARDIA)    Difficulty of Paying Living Expenses: Not very hard  Food Insecurity: No Food Insecurity (12/04/2021)   Hunger Vital Sign    Worried About Running Out of Food in the Last Year: Never true    Ran Out of Food in the Last Year: Never true  Transportation Needs: No Transportation Needs (12/04/2021)   PRAPARE - Hydrologist (Medical): No    Lack of Transportation (Non-Medical): No  Physical Activity: Insufficiently Active (12/04/2021)   Exercise Vital Sign    Days of Exercise per Week: 2 days    Minutes of Exercise per Session: 20 min  Stress: No Stress Concern Present (12/04/2021)   [place]    Feeling of Stress : Only a little  Social  Connections: Moderately Integrated (12/04/2021)   Social Connection and Isolation Panel [NHANES]    Frequency of Communication with Friends and Family: More than three times a week    Frequency of Social Gatherings with Friends and Family: More than three times a week    Attends Religious Services: More than 4 times per year    Active Member of [employer] or Organizations: No    Attends Music therapist: Never    Marital Status: Married    Tobacco Counseling Counseling given: Not Answered Tobacco comments: smoked x 4 years many years ago   Clinical Intake:  Pre-visit preparation completed: Yes  Pain : No/denies pain     Nutritional Risks: None Diabetes: Yes CBG done?: No Did pt. bring in CBG monitor from home?: No  How often do you need to have someone help you when you read instructions, pamphlets, or other written materials from your doctor or pharmacy?: 1 - Never  Diabetic?yes Nutrition Risk Assessment:  Has the patient had any N/V/D within the last 2 months?  No  Does the patient have any non-healing wounds?  No  Has the patient had any unintentional weight loss or weight gain?  No   Diabetes:  Is the patient diabetic?  Yes  If diabetic, was a CBG obtained today?  No  Did the patient bring in their glucometer from home?  No  How often do you monitor your CBG's? never.   Financial Strains and Diabetes Management:  Are you having any financial strains with the device, your supplies or your medication? No .  Does the patient want to be seen by Chronic Care Management for management of their diabetes?  No  Would the patient like to be referred to a Nutritionist or for Diabetic Management?  No   Diabetic Exams:  Diabetic Eye Exam: Completed 10/26/19. Pt has been advised about the importance in completing this exam.   Diabetic Foot Exam: Completed 07/18/21. Pt has been advised about the importance in completing this exam.   Interpreter Needed?:  No  Information entered by :: Kirke Shaggy, LPN   Activities of Daily Living    12/04/2021    3:14 PM  In your present  state of health, do you have any difficulty performing the following activities:  Hearing? 1  Vision? 0  Difficulty concentrating or making decisions? 0  Walking or climbing stairs? 0  Dressing or bathing? 0  Doing errands, shopping? 0  Preparing Food and eating ? N  Using the Toilet? N  In the past six months, have you accidently leaked urine? N  Do you have problems with loss of bowel control? N  Managing your Medications? N  Managing your Finances? N  Housekeeping or managing your Housekeeping? N    Patient Care Team: Kathyrn Drown, MD as PCP - General (Family Medicine) Jettie Booze, MD as PCP - Cardiology (Cardiology) Danie Binder, MD (Inactive) as Attending Physician (Gastroenterology)  Indicate any recent Medical Services you may have received from other than [facility] providers in the past year (date may be approximate).     Assessment:   This is a routine wellness examination for [facility].  Hearing/Vision screen Hearing Screening - Comments:: Wears aids Vision Screening - Comments:: No glasses  Dietary issues and exercise activities discussed: Current Exercise Habits: Home exercise routine, Type of exercise: walking, Time (Minutes): 20, Frequency (Times/Week): 2, Weekly Exercise (Minutes/Week): 40, Intensity: Mild   Goals Addressed             This Visit's Progress    DIET - EAT MORE FRUITS AND VEGETABLES         Depression Screen    12/04/2021    3:12 PM 07/10/2021   11:25 AM 07/02/2021    1:19 PM 12/19/2020   11:00 AM 10/10/2020    3:11 PM 08/25/2020   10:51 AM 08/07/2020    3:03 PM  PHQ 2/9 Scores  PHQ - 2 Score 1 2 0 0 0 0 2  PHQ- 9 Score 1 9   0 0 8    Fall Risk    12/04/2021    3:14 PM 07/10/2021   11:24 AM 07/02/2021    1:19 PM 04/19/2021    8:57 AM 12/19/2020   11:00 AM  Fall Risk   Falls in the past year? 0 0 0 0 0   Number falls in past yr: 0  0 0 0  Injury with Fall? 0  0 0 0  Risk for fall due to : No Fall Risks  No Fall Risks No Fall Risks No Fall Risks  Follow up Falls prevention discussed  Falls evaluation completed Falls evaluation completed Falls evaluation completed    [place]:  Any stairs in or around the home? Yes  If so, are there any without handrails? No  Home free of loose throw rugs in walkways, pet beds, electrical cords, etc? Yes  Adequate lighting in your home to reduce risk of falls? Yes   ASSISTIVE DEVICES UTILIZED TO PREVENT FALLS:  Life alert? No  Use of a cane, walker or w/c? No  Grab bars in the bathroom? No  Shower chair or bench in shower? Yes  Elevated toilet seat or a handicapped toilet? Yes    Cognitive Function:        12/04/2021    3:17 PM  6CIT Screen  What Year? 0 points  What month? 0 points  What time? 0 points  Count back from 20 0 points  Months in reverse 0 points  Repeat phrase 0 points  Total Score 0 points    Immunizations Immunization History  Administered Date(s) Administered   Influenza Split 02/22/2013  Influenza,inj,Quad PF,6+ Mos 02/15/2014, 03/20/2018   Influenza-Unspecified 01/22/2012   Pneumococcal Polysaccharide-23 08/07/2020    TDAP status: Due, Education has been provided regarding the importance of this vaccine. Advised may receive this vaccine at local pharmacy or Health Dept. Aware to provide a copy of the vaccination record if obtained from local pharmacy or Health Dept. Verbalized acceptance and understanding.  Flu Vaccine status: Declined, Education has been provided regarding the importance of this vaccine but patient still declined. Advised may receive this vaccine at local pharmacy or Health Dept. Aware to provide a copy of the vaccination record if obtained from local pharmacy or Health Dept. Verbalized acceptance and understanding.  Pneumococcal vaccine status: Due, Education  has been provided regarding the importance of this vaccine. Advised may receive this vaccine at local pharmacy or Health Dept. Aware to provide a copy of the vaccination record if obtained from local pharmacy or Health Dept. Verbalized acceptance and understanding.  Covid-19 vaccine status: Declined, Education has been provided regarding the importance of this vaccine but patient still declined. Advised may receive this vaccine at local pharmacy or Health Dept.or vaccine clinic. Aware to provide a copy of the vaccination record if obtained from local pharmacy or Health Dept. Verbalized acceptance and understanding.  Qualifies for Shingles Vaccine? Yes   Zostavax completed No   Shingrix Completed?: No.    Education has been provided regarding the importance of this vaccine. Patient has been advised to call insurance company to determine out of pocket expense if they have not yet received this vaccine. Advised may also receive vaccine at local pharmacy or Health Dept. Verbalized acceptance and understanding.  Screening Tests Health Maintenance  Topic Date Due   TETANUS/TDAP  Never done   Zoster Vaccines- Shingrix (1 of 2) Never done   OPHTHALMOLOGY EXAM  01/07/2019   DEXA SCAN  Never done   COLONOSCOPY (Pts 45-10yr Insurance coverage will need to be confirmed)  11/16/2021   INFLUENZA VACCINE  11/06/2021   Pneumonia Vaccine 65 Years old (2 - PCV) 11/08/2021   MAMMOGRAM  04/18/2022   HEMOGLOBIN A1C  04/19/2022   URINE MICROALBUMIN  04/19/2022   FOOT EXAM  07/19/2022   Hepatitis C Screening  Completed   HIV Screening  Completed   HPV VACCINES  Aged Out   COVID-19 Vaccine  Discontinued    Health Maintenance  Health Maintenance Due  Topic Date Due   TETANUS/TDAP  Never done   Zoster Vaccines- Shingrix (1 of 2) Never done   OPHTHALMOLOGY EXAM  01/07/2019   DEXA SCAN  Never done   COLONOSCOPY (Pts 45-44yrInsurance coverage will need to be confirmed)  11/16/2021   INFLUENZA VACCINE   11/06/2021   Pneumonia Vaccine 6565Years old (2 - PCV) 11/08/2021    Colorectal cancer screening: Type of screening: Colonoscopy. Completed 11/17/18. Repeat every 3 years  Mammogram status: Completed 04/18/21. Repeat every year  Bone Density status: Ordered today. Pt provided with contact info and advised to call to schedule appt.  Lung Cancer Screening: (Low Dose CT Chest recommended if Age 65-80ears, 30 pack-year currently smoking OR have quit w/in 15years.) does not qualify.   Additional Screening:  Hepatitis C Screening: does qualify; Completed 10/14/16  Vision Screening: Recommended annual ophthalmology exams for early detection of glaucoma and other disorders of the eye. Is the patient up to date with their annual eye exam?  No  Who is the provider or what is the name of the office in which the patient attends  annual eye exams? No one If pt is not established with a provider, would they like to be referred to a provider to establish care? No .   Dental Screening: Recommended annual dental exams for proper oral hygiene  Community Resource Referral / Chronic Care Management: CRR required this visit?  No   CCM required this visit?  No      Plan:     I have personally reviewed and noted the following in the patient's chart:   Medical and social history Use of alcohol, tobacco or illicit drugs  Current medications and supplements including opioid prescriptions. Patient is currently taking opioid prescriptions. Information provided to patient regarding non-opioid alternatives. Patient advised to discuss non-opioid treatment plan with their provider. Functional ability and status Nutritional status Physical activity Advanced directives List of other physicians Hospitalizations, surgeries, and ER visits in previous 12 months Vitals Screenings to include cognitive, depression, and falls Referrals and appointments  In addition, I have reviewed and discussed with patient  certain preventive protocols, quality metrics, and best practice recommendations. A written personalized care plan for preventive services as well as general preventive health recommendations were provided to patient.     Dionisio David, LPN   08/11/3974   Nurse Notes: none

## 2021-12-05 ENCOUNTER — Encounter: Payer: Self-pay | Admitting: *Deleted

## 2021-12-13 ENCOUNTER — Ambulatory Visit (INDEPENDENT_AMBULATORY_CARE_PROVIDER_SITE_OTHER): Payer: PPO | Admitting: Nurse Practitioner

## 2021-12-13 ENCOUNTER — Encounter: Payer: Self-pay | Admitting: Nurse Practitioner

## 2021-12-13 VITALS — BP 134/79 | HR 65 | Temp 98.4°F | Wt 227.2 lb

## 2021-12-13 DIAGNOSIS — F419 Anxiety disorder, unspecified: Secondary | ICD-10-CM | POA: Diagnosis not present

## 2021-12-13 DIAGNOSIS — U071 COVID-19: Secondary | ICD-10-CM | POA: Diagnosis not present

## 2021-12-13 MED ORDER — ALPRAZOLAM 0.5 MG PO TABS: ORAL_TABLET | ORAL | 0 refills | Status: DC

## 2021-12-13 MED ORDER — MOLNUPIRAVIR EUA 200MG CAPSULE: 4.0000 | ORAL_CAPSULE | Freq: Two times a day (BID) | ORAL | 0 refills | Status: AC

## 2021-12-13 NOTE — Progress Notes (Signed)
   Subjective:    Patient ID: Deborah Carroll, female    DOB: [birth year], 66 y.o.   MRN: [id number]  HPI  Pt tested positive for COVID yesterday. Pt arrives with headache, cough, body aches, scratchy throat and chest feels stuffy. Symptoms began at 4 yesterday afternoon.   Patient denies any difficulty breathing, SOB, fever, chills, chest tightness, chest pain, wheezing, palpitations .    Review of Systems  Constitutional:  Positive for fatigue.  HENT:  Positive for congestion.   Respiratory:  Positive for cough.        Chest "stuffiness"  All other systems reviewed and are negative.      Objective:   Physical Exam Vitals reviewed.  Constitutional:      General: She is not in acute distress.    Appearance: Normal appearance. She is normal weight. She is not ill-appearing, toxic-appearing or diaphoretic.  HENT:     Head: Normocephalic and atraumatic.     Nose: Congestion and rhinorrhea present.     Mouth/Throat:     Mouth: Mucous membranes are moist.     Pharynx: Oropharynx is clear. No oropharyngeal exudate or posterior oropharyngeal erythema.  Neck:     Vascular: No carotid bruit.  Cardiovascular:     Rate and Rhythm: Normal rate and regular rhythm.     Pulses: Normal pulses.     Heart sounds: Normal heart sounds. No murmur heard. Pulmonary:     Effort: Pulmonary effort is normal. No respiratory distress.     Breath sounds: Normal breath sounds. No wheezing.  Abdominal:     General: Abdomen is flat. Bowel sounds are normal. There is no distension.     Palpations: Abdomen is soft. There is no mass.     Tenderness: There is no abdominal tenderness. There is no right CVA tenderness, left CVA tenderness, guarding or rebound.     Hernia: No hernia is present.  Musculoskeletal:     Cervical back: Normal range of motion and neck supple. No rigidity or tenderness.     Comments: Grossly intact  Lymphadenopathy:     Cervical: No cervical adenopathy.  Skin:    General: Skin is  warm.     Capillary Refill: Capillary refill takes less than 2 seconds.  Neurological:     Mental Status: She is alert.     Comments: Grossly intact  Psychiatric:        Mood and Affect: Mood normal.        Behavior: Behavior normal.        Assessment & Plan:   1. COVID-19 virus infection - Due to patient's medication history believe that antiviral beneficial -Will use Lagevrio instead of Paxlovid due to patient use of Dilantin and Crestor. - molnupiravir EUA (LAGEVRIO) 200 mg CAPS capsule; Take 4 capsules (800 mg total) by mouth 2 (two) times daily for 5 days.  Dispense: 40 capsule; Refill: 0 - Go to ED if you experience SOB, difficulty breathing, or any other changes to health status.   2. Anxiety - refill - ALPRAZolam (XANAX) 0.5 MG tablet; 1/2 tablet to a full tablet for severe anxiousness use sparingly not with hydrocodone  Dispense: 10 tablet; Refill: 0 - Follow up with PCP for more refills

## 2021-12-14 ENCOUNTER — Encounter: Payer: Self-pay | Admitting: Nurse Practitioner

## 2021-12-26 ENCOUNTER — Ambulatory Visit (HOSPITAL_COMMUNITY)
Admission: RE | Admit: 2021-12-26 | Discharge: 2021-12-26 | Disposition: A | Discharge status: Home / Self Care | Payer: PPO | Source: Ambulatory Visit | Attending: Family Medicine | Admitting: Family Medicine

## 2021-12-26 DIAGNOSIS — Z780 Asymptomatic menopausal state: Secondary | ICD-10-CM | POA: Diagnosis not present

## 2021-12-31 ENCOUNTER — Encounter: Payer: Self-pay | Admitting: Family Medicine

## 2022-01-01 NOTE — Telephone Encounter (Signed)
Nurses Please check with her pharmacy to see if they have 5 mg / 325 mg hydrocodone or 7.5/325 hydrocodone.  Even a lower dose would be better than no dose

## 2022-01-03 ENCOUNTER — Other Ambulatory Visit: Payer: Self-pay | Admitting: Family Medicine

## 2022-01-03 MED ORDER — HYDROCODONE-ACETAMINOPHEN 7.5-325 MG PO TABS: ORAL_TABLET | ORAL | 0 refills | Status: DC

## 2022-01-03 NOTE — Telephone Encounter (Signed)
So this is a situation where 7.5/325 hydrocodone would be better than none.  There is a Producer, television/film/video going on regarding pain medicines and the availability of pain medicines can fluctuate from day to day.  If she would like for [place] to send 2 week supply of the 7.5 taken 3 times a day then hopefully at that point the 10 mg are available.  This is more likely to be a ongoing issue as the government regulates how many pain pills can be made and more people are on pain medicines than what they were on previously  Please see what she would like to do  Also typically other pharmacies do not want to do a one-time pain medicine fill because they want to try to guarantee that their own clients have access to pain medicine

## 2022-01-03 NOTE — Telephone Encounter (Signed)
Hopefully within 2 weeks 10 mg will be available  7.5 mg prescription was sent electronically today  Keep all regular follow-up visits recommend with pain management office visits every 3 months is required by law specifically for pain management  If any questions concerns issues let us know

## 2022-01-17 ENCOUNTER — Ambulatory Visit: Payer: PPO | Admitting: Family Medicine

## 2022-01-21 ENCOUNTER — Ambulatory Visit (INDEPENDENT_AMBULATORY_CARE_PROVIDER_SITE_OTHER): Payer: PPO | Admitting: Family Medicine

## 2022-01-21 VITALS — BP 133/83 | Temp 97.5°F | Ht 62.0 in | Wt 229.0 lb

## 2022-01-21 DIAGNOSIS — Z1211 Encounter for screening for malignant neoplasm of colon: Secondary | ICD-10-CM

## 2022-01-21 DIAGNOSIS — Z23 Encounter for immunization: Secondary | ICD-10-CM | POA: Diagnosis not present

## 2022-01-21 DIAGNOSIS — E785 Hyperlipidemia, unspecified: Secondary | ICD-10-CM | POA: Diagnosis not present

## 2022-01-21 DIAGNOSIS — E1169 Type 2 diabetes mellitus with other specified complication: Secondary | ICD-10-CM

## 2022-01-21 DIAGNOSIS — Z79891 Long term (current) use of opiate analgesic: Secondary | ICD-10-CM | POA: Diagnosis not present

## 2022-01-21 DIAGNOSIS — G609 Hereditary and idiopathic neuropathy, unspecified: Secondary | ICD-10-CM

## 2022-01-21 DIAGNOSIS — M545 Low back pain, unspecified: Secondary | ICD-10-CM

## 2022-01-21 DIAGNOSIS — G8929 Other chronic pain: Secondary | ICD-10-CM | POA: Diagnosis not present

## 2022-01-21 MED ORDER — POTASSIUM CHLORIDE CRYS ER 20 MEQ PO TBCR: 20.0000 meq | EXTENDED_RELEASE_TABLET | Freq: Two times a day (BID) | ORAL | 1 refills | Status: DC

## 2022-01-21 MED ORDER — ROSUVASTATIN CALCIUM 5 MG PO TABS: ORAL_TABLET | ORAL | 1 refills | Status: DC

## 2022-01-21 MED ORDER — HYDROCODONE-ACETAMINOPHEN 10-325 MG PO TABS: ORAL_TABLET | ORAL | 0 refills | Status: DC

## 2022-01-21 MED ORDER — PROPRANOLOL HCL 10 MG PO TABS: 10.0000 mg | ORAL_TABLET | Freq: Two times a day (BID) | ORAL | 4 refills | Status: DC

## 2022-01-21 MED ORDER — PHENYTOIN SODIUM EXTENDED 100 MG PO CAPS: ORAL_CAPSULE | ORAL | 1 refills | Status: DC

## 2022-01-21 MED ORDER — HYDROCHLOROTHIAZIDE 25 MG PO TABS: ORAL_TABLET | ORAL | 1 refills | Status: DC

## 2022-01-21 NOTE — Patient Instructions (Signed)
Discussion of GLP-1 medications Please remember these medications are to assist with weight reduction and diabetes control.  They do not take the place of healthy eating and regular physical activity. There are several GLP-1 medications that can help with weight reduction and diabetes control.  GLP-1 medications with indication for diabetes-Trulicity, Victoza, Bydureon , Mounjaro, Ozempic, and Rybelsus (although these are injected medicines except for Rybelsus which is a pill) GLP-1 medications with indications for weight loss-Wegovy, and Saxenda  Mechanism of action  These medications stimulate glucagon peptide receptors.  By doing so it does the following:  1-slows down stomach emptying-essentially food takes longer to go through the stomach and the intestines-this can lessen appetite  2-reduces glucagon secretion from the pancreas-this helps keep blood glucose levels stable between meals  3-increase his insulin release from the pancreas-with diabetics this helps keep blood glucose levels stable  4-promotes the feeling of being full in the brain-encouraged him receptors in the brain received the signal so the brain and body know that it is time to stop eating Benefits of the medication  1-reduced weight-reduction of weight is more significant at higher dosing.  Not as much weight reduction with Rybelsus   A- typically Wegovy at higher doses can assist with significant weight reduction.  2-improved blood glucose levels-with diabetics typically we see a significant drop with A1c when the medication is adjusted appropriately.  3-decrease risk of heart attacks and strokes-individuals with type 2 diabetes are at increased risk of heart attacks and strokes.  These GLP-1 medications can reduce the risk by 22%.  Risk of GLP-1 medications-these are some of the risks.  It is important to talk with your provider in a shared discussion before starting any medication.  Contraindications to GLP-1  medications-in other words who should not take these.  1-individuals with history of thyroid medullary cancer  2-individuals with family history of multiple endocrine neoplasm syndrome type II (MEN 2)  3-individuals with a history of pancreatitis  4-those with a history of severe hypersensitivity or allergy reactions to GLP-1 medications  5-should be avoided in individuals with a history of suicidal attempts or active suicidal ideation.  Cautions include- 1- risk of thyroid C-cell tumors were seen in rats during clinical testing in humans the relevancy of this information has not been determined 2-risk of pancreatitis including fatal and nonfatal hemorrhagic or necrotizing pancreatitis 3-gallbladder disease slightly increased risk of gallstones 4-hypoglycemia-more common with individuals who are on insulin or sulfonylurea such as glipizide 5-acute kidney injury or worsening of chronic renal failure often this is triggered by severe vomiting and diarrhea as side effects to GLP-1. 6-slightly increased risk of diabetic retinopathy complications in patients with type 2 diabetes 7-heart rate increase-slight increase of base heart rate with GLP-1 8-suicidal behavior and ideation has been reported in clinical trials with other weight loss medicines.  If depression occurs with medication or suicidal ideation stop medication immediately notify provider or seek help.  Common side effects include nausea, vomiting, diarrhea, constipation and increased heart rate.  Less common side effects severe abdominal pain-if this occurs notify provider stop medication get tested for pancreatitis. Full discussion with your provider should occur before starting these medicines.       

## 2022-01-21 NOTE — Progress Notes (Signed)
Subjective:    Patient ID: Deborah Carroll, female    DOB: [birth year], 66 y.o.   MRN: [id number]  HPI This patient was seen today for chronic pain  The medication list was reviewed and updated.  Location of Pain for which the patient has been treated with regarding narcotics: feet legs and back low and mid  Onset of this pain: chronic   -Compliance with medication: daily  - Number patient states they take daily: 2 to 2.5  -when was the last dose patient took? 2 days ago - has been out   The patient was advised the importance of maintaining medication and not using illegal substances with these.  Here for refills and follow up  The patient was educated that we can provide 3 monthly scripts for their medication, it is their responsibility to follow the instructions.  Side effects or complications from medications: none  Patient is aware that pain medications are meant to minimize the severity of the pain to allow their pain levels to improve to allow for better function. They are aware of that pain medications cannot totally remove their pain.  Due for UDT ( at least once per year) : last 04/2021  Scale of 1 to 10 ( 1 is least 10 is most) Your pain level without the medicine: 10 Your pain level with medication 5  Scale 1 to 10 ( 1-helps very little, 10 helps very well) How well does your pain medication reduce your pain so you can function better through out the day? 5  Quality of the pain: sharp, burning  Persistence of the pain: persistent  Modifying factors: resting       Review of Systems     Objective:   Physical Exam  General-in no acute distress Eyes-no discharge Lungs-respiratory rate normal, CTA CV-no murmurs,RRR Extremities skin warm dry no edema Neuro grossly normal Behavior normal, alert       Assessment & Plan:  1. Encounter for long-term opiate analgesic use The patient was seen in followup for chronic pain. A review over at their current  pain status was discussed. Drug registry was checked. Prescriptions were given.  Regular follow-up recommended. Discussion was held regarding the importance of compliance with medication as well as pain medication contract.  Patient was informed that medication may cause drowsiness and should not be combined  with other medications/alcohol or street drugs. If the patient feels medication is causing altered alertness then do not drive or operate dangerous equipment.  Should be noted that the patient appears to be meeting appropriate use of opioids and response.  Evidenced by improved function and decent pain control without significant side effects and no evidence of overt aberrancy issues.  Upon discussion with the patient today they understand that opioid therapy is optional and they feel that the pain has been refractory to reasonable conservative measures and is significant and affecting quality of life enough to warrant ongoing therapy and wishes to continue opioids.  Refills were provided.  Patient states pain medicine does allow her to function better She denies any major setbacks She tolerates medicine well Without the medicine she would have a difficult time functioning In the past she has tried anti-inflammatory stretches - HYDROcodone-acetaminophen (NORCO) 10-325 MG tablet; Take 1 tab three times daily as needed for pain  Dispense: 90 tablet; Refill: 0  2. Hyperlipidemia associated with type 2 diabetes mellitus ([place]) Continue medication watch diet closely - rosuvastatin (CRESTOR) 5 MG tablet; TAKE 1 TABLET(5 MG) BY  MOUTH DAILY  Dispense: 90 tablet; Refill: 1  3. Idiopathic peripheral neuropathy Wear shoes when outside Patient does have neuropathic pain-pain medication does help  4. Chronic bilateral low back pain without sciatica Pain medicines, stretching exercises  5. Immunization due Today - Pneumococcal conjugate vaccine 20-valent (Prevnar 20)  6. Colon cancer  screening Referral Dr. Abbey Chatters - Ambulatory referral to Gastroenterology Follow-up on potassium was good Does not need any comprehensive blood work currently Do this on next visit  Morbid obesity portion control regular physical activity

## 2022-01-23 ENCOUNTER — Encounter: Payer: Self-pay | Admitting: *Deleted

## 2022-01-24 DIAGNOSIS — G4733 Obstructive sleep apnea (adult) (pediatric): Secondary | ICD-10-CM | POA: Diagnosis not present

## 2022-02-12 ENCOUNTER — Encounter: Payer: Self-pay | Admitting: Family Medicine

## 2022-02-18 ENCOUNTER — Other Ambulatory Visit: Payer: Self-pay | Admitting: Family Medicine

## 2022-02-20 ENCOUNTER — Encounter: Payer: Self-pay | Admitting: Family Medicine

## 2022-02-21 ENCOUNTER — Other Ambulatory Visit: Payer: Self-pay | Admitting: Family Medicine

## 2022-02-21 DIAGNOSIS — Z79891 Long term (current) use of opiate analgesic: Secondary | ICD-10-CM

## 2022-02-21 MED ORDER — HYDROCODONE-ACETAMINOPHEN 10-325 MG PO TABS: ORAL_TABLET | ORAL | 0 refills | Status: DC

## 2022-03-05 ENCOUNTER — Ambulatory Visit (HOSPITAL_COMMUNITY)
Admission: RE | Admit: 2022-03-05 | Discharge: 2022-03-05 | Disposition: A | Discharge status: Home / Self Care | Payer: PPO | Source: Ambulatory Visit | Attending: Family Medicine | Admitting: Family Medicine

## 2022-03-05 ENCOUNTER — Ambulatory Visit (INDEPENDENT_AMBULATORY_CARE_PROVIDER_SITE_OTHER): Payer: PPO | Admitting: Family Medicine

## 2022-03-05 VITALS — BP 138/80 | HR 69 | Temp 97.7°F | Ht 62.0 in | Wt 226.0 lb

## 2022-03-05 DIAGNOSIS — R002 Palpitations: Secondary | ICD-10-CM | POA: Insufficient documentation

## 2022-03-05 DIAGNOSIS — Z1211 Encounter for screening for malignant neoplasm of colon: Secondary | ICD-10-CM | POA: Diagnosis not present

## 2022-03-05 DIAGNOSIS — R9431 Abnormal electrocardiogram [ECG] [EKG]: Secondary | ICD-10-CM

## 2022-03-05 DIAGNOSIS — E876 Hypokalemia: Secondary | ICD-10-CM

## 2022-03-05 DIAGNOSIS — M546 Pain in thoracic spine: Secondary | ICD-10-CM | POA: Diagnosis not present

## 2022-03-05 DIAGNOSIS — D72829 Elevated white blood cell count, unspecified: Secondary | ICD-10-CM

## 2022-03-05 DIAGNOSIS — R1013 Epigastric pain: Secondary | ICD-10-CM | POA: Diagnosis not present

## 2022-03-05 DIAGNOSIS — E119 Type 2 diabetes mellitus without complications: Secondary | ICD-10-CM

## 2022-03-05 NOTE — Progress Notes (Addendum)
   Subjective:    Patient ID: Deborah Carroll, female    DOB: 1956/01/27, 66 y.o.   MRN: [id number]  HPI Mid back pain x 2 to 3 weeks recently worsened ABD pain to to a chronic constipation  Patient relates over the past couple weeks she has had mid back pain hurts with certain movements hurts with certain rotations pain fairly severe.  Denies chest pressure tightness.  States the pain does shoot from her back through to her chest very sharp hurts with certain rotations flexing forward or rolling over in bed she has a history of back troubles and has had previous back surgery  She does have underlying health issues including morbid obesity as well as hyperlipidemia diabetes sleep apnea  She also has chronic pain in the feet with neuropathy  In addition to this she denies any vomiting diarrhea or bloody stools currently  Review of Systems     Objective:   Physical Exam  General-in no acute distress Eyes-no discharge Lungs-respiratory rate normal, CTA CV-no murmurs,RRR Extremities skin warm dry no edema Neuro grossly normal Behavior normal, alert Mild epigastric tenderness No edema noted in the lower legs Increased pain with rotation left and right tilting to the right and extending backwards and when she bends over and stands back up she has significant mid back pain  EKG has some ST segment flattening along with atypical T wave inversion consistent with possible strain also poor R wave progression computer read this as anterior infarct age undetermined but it is quite possible this could all be LVH related issues these are new changes compared to previous EKG We will progress with echo Also progress with cardiology consult It should also be noted that BNP and troponin were negative    Assessment & Plan:  1. Thoracic spine pain Start off with x-rays may well need MRI as well wait and see what lab work shows as well.  Patient already has hydrocodone for pain does not want to advance  to oxycodone may need physical therapy as well - DG Thoracic Spine 2 View - C-reactive protein - CBC with Differential - Lipase - Hepatic Function Panel - Basic Metabolic Panel  2. Epigastric pain Mild epigastric pain although patient is not complaining of this currently just more so the mid back pain that radiates and hurts with movement.  We will do lab work if enzymes are off may need CT scan to look at take pancreas - EKG 12-Lead - DG Thoracic Spine 2 View - C-reactive protein - CBC with Differential - Lipase - Hepatic Function Panel - Basic Metabolic Panel  3. Palpitation Patient complains of an occasional palpitation EKG has abnormal findings but no obvious acute cardiac issue but would benefit from seeing cardiology as well as having echo with underlying risk factors may need evaluation for coronary artery disease - DG Thoracic Spine 2 View - C-reactive protein - CBC with Differential - Lipase - Hepatic Function Panel - Basic Metabolic Panel  4. Screen for colon cancer Screening due - Ambulatory referral to Gastroenterology  5. Abnormal EKG Please see discussion above referral to cardiology as well as echo  Further work-up and further testing and treatment based upon lab work awaiting to return

## 2022-03-06 ENCOUNTER — Other Ambulatory Visit (HOSPITAL_COMMUNITY)
Admission: RE | Admit: 2022-03-06 | Discharge: 2022-03-06 | Disposition: A | Discharge status: Home / Self Care | Payer: PPO | Source: Ambulatory Visit | Attending: Family Medicine | Admitting: Family Medicine

## 2022-03-06 ENCOUNTER — Ambulatory Visit (HOSPITAL_COMMUNITY)
Admission: RE | Admit: 2022-03-06 | Discharge: 2022-03-06 | Disposition: A | Discharge status: Home / Self Care | Payer: PPO | Source: Ambulatory Visit | Attending: Family Medicine | Admitting: Family Medicine

## 2022-03-06 ENCOUNTER — Other Ambulatory Visit: Payer: Self-pay | Admitting: *Deleted

## 2022-03-06 DIAGNOSIS — R9431 Abnormal electrocardiogram [ECG] [EKG]: Secondary | ICD-10-CM

## 2022-03-06 DIAGNOSIS — I501 Left ventricular failure: Secondary | ICD-10-CM | POA: Insufficient documentation

## 2022-03-06 DIAGNOSIS — R0609 Other forms of dyspnea: Secondary | ICD-10-CM

## 2022-03-06 LAB — CBC WITH DIFFERENTIAL/PLATELET
Basophils Absolute: 0 10*3/uL (ref 0.0–0.2)
Basos: 0 %
EOS (ABSOLUTE): 0.2 10*3/uL (ref 0.0–0.4)
Eos: 2 %
Hematocrit: 39.5 % (ref 34.0–46.6)
Hemoglobin: 14.1 g/dL (ref 11.1–15.9)
Immature Grans (Abs): 0 10*3/uL (ref 0.0–0.1)
Immature Granulocytes: 0 %
Lymphocytes Absolute: 4.6 10*3/uL — ABNORMAL HIGH (ref 0.7–3.1)
Lymphs: 33 %
MCH: 30.2 pg (ref 26.6–33.0)
MCHC: 35.7 g/dL (ref 31.5–35.7)
MCV: 85 fL (ref 79–97)
Monocytes Absolute: 0.9 10*3/uL (ref 0.1–0.9)
Monocytes: 7 %
Neutrophils Absolute: 7.8 10*3/uL — ABNORMAL HIGH (ref 1.4–7.0)
Neutrophils: 58 %
Platelets: 202 10*3/uL (ref 150–450)
RBC: 4.67 x10E6/uL (ref 3.77–5.28)
RDW: 12.8 % (ref 11.7–15.4)
WBC: 13.6 10*3/uL — ABNORMAL HIGH (ref 3.4–10.8)

## 2022-03-06 LAB — BASIC METABOLIC PANEL
Anion gap: 11 (ref 5–15)
BUN/Creatinine Ratio: 16 (ref 12–28)
BUN: 11 mg/dL (ref 8–27)
BUN: 16 mg/dL (ref 8–23)
CO2: 30 mmol/L — ABNORMAL HIGH (ref 20–29)
CO2: 29 mmol/L (ref 22–32)
Calcium: 9.2 mg/dL (ref 8.7–10.3)
Calcium: 8.6 mg/dL — ABNORMAL LOW (ref 8.9–10.3)
Chloride: 95 mmol/L — ABNORMAL LOW (ref 96–106)
Chloride: 96 mmol/L — ABNORMAL LOW (ref 98–111)
Creatinine, Ser: 0.68 mg/dL (ref 0.57–1.00)
Creatinine, Ser: 0.84 mg/dL (ref 0.44–1.00)
GFR, Estimated: >60 mL/min (ref >60)
Glucose, Bld: 154 mg/dL — ABNORMAL HIGH (ref 70–99)
Glucose: 148 mg/dL — ABNORMAL HIGH (ref 70–99)
Potassium: 3.4 mmol/L — ABNORMAL LOW (ref 3.5–5.2)
Potassium: 3 mmol/L — ABNORMAL LOW (ref 3.5–5.1)
Sodium: 140 mmol/L (ref 134–144)
Sodium: 136 mmol/L (ref 135–145)
eGFR: 97 mL/min/{1.73_m2} (ref >59)

## 2022-03-06 LAB — ECHOCARDIOGRAM COMPLETE
Area-P 1/2: 2.62 cm2
S' Lateral: 2.5 cm

## 2022-03-06 LAB — HEPATIC FUNCTION PANEL
ALT: 19 IU/L (ref 0–32)
AST: 27 IU/L (ref 0–40)
Albumin: 4.4 g/dL (ref 3.9–4.9)
Alkaline Phosphatase: 146 IU/L — ABNORMAL HIGH (ref 44–121)
Bilirubin Total: 0.3 mg/dL (ref 0.0–1.2)
Bilirubin, Direct: 0.13 mg/dL (ref 0.00–0.40)
Total Protein: 7.3 g/dL (ref 6.0–8.5)

## 2022-03-06 LAB — TROPONIN I (HIGH SENSITIVITY): Troponin I (High Sensitivity): 4 ng/L (ref <18)

## 2022-03-06 LAB — LIPASE: Lipase: 42 U/L (ref 14–72)

## 2022-03-06 LAB — BRAIN NATRIURETIC PEPTIDE: B Natriuretic Peptide: 30 pg/mL (ref 0.0–100.0)

## 2022-03-06 LAB — C-REACTIVE PROTEIN: CRP: 22 mg/L — ABNORMAL HIGH (ref 0–10)

## 2022-03-06 MED ORDER — PERFLUTREN LIPID MICROSPHERE
1.0000 mL | INTRAVENOUS | Status: AC | PRN
  Administered 2022-03-06: 4 mL via INTRAVENOUS

## 2022-03-06 NOTE — Addendum Note (Signed)
Addended by: Dairl Ponder on: 03/06/2022 09:09 AM   Modules accepted: Orders

## 2022-03-06 NOTE — Progress Notes (Signed)
ECHO ordered and scheduled for 03/06/22

## 2022-03-06 NOTE — Progress Notes (Signed)
*  PRELIMINARY RESULTS* Echocardiogram 2D Echocardiogram has been performed with Definity.  Deborah Carroll 03/06/2022, 2:23 PM

## 2022-03-07 ENCOUNTER — Encounter: Payer: Self-pay | Admitting: *Deleted

## 2022-03-07 NOTE — Addendum Note (Signed)
Addended by: Dairl Ponder on: 03/07/2022 10:55 AM   Modules accepted: Orders

## 2022-03-17 ENCOUNTER — Other Ambulatory Visit: Payer: Self-pay | Admitting: Family Medicine

## 2022-03-17 DIAGNOSIS — R9431 Abnormal electrocardiogram [ECG] [EKG]: Secondary | ICD-10-CM

## 2022-03-17 NOTE — Telephone Encounter (Signed)
Please go ahead with cardiology referral due to abnormal EKG  I can see where in the echo referral was placed but I do not see where cardiology was please do-please mark as urgent due to chest pain thank you

## 2022-04-23 ENCOUNTER — Ambulatory Visit: Payer: PPO | Admitting: Physician Assistant

## 2022-04-23 ENCOUNTER — Ambulatory Visit (INDEPENDENT_AMBULATORY_CARE_PROVIDER_SITE_OTHER): Payer: PPO | Admitting: Family Medicine

## 2022-04-23 VITALS — BP 122/72 | HR 65 | Temp 98.4°F | Ht 62.0 in | Wt 232.0 lb

## 2022-04-23 DIAGNOSIS — E119 Type 2 diabetes mellitus without complications: Secondary | ICD-10-CM

## 2022-04-23 DIAGNOSIS — E1169 Type 2 diabetes mellitus with other specified complication: Secondary | ICD-10-CM

## 2022-04-23 DIAGNOSIS — R569 Unspecified convulsions: Secondary | ICD-10-CM

## 2022-04-23 DIAGNOSIS — E785 Hyperlipidemia, unspecified: Secondary | ICD-10-CM | POA: Diagnosis not present

## 2022-04-23 DIAGNOSIS — D72829 Elevated white blood cell count, unspecified: Secondary | ICD-10-CM

## 2022-04-23 DIAGNOSIS — G609 Hereditary and idiopathic neuropathy, unspecified: Secondary | ICD-10-CM

## 2022-04-23 DIAGNOSIS — Z79891 Long term (current) use of opiate analgesic: Secondary | ICD-10-CM

## 2022-04-23 MED ORDER — PHENYTOIN SODIUM EXTENDED 100 MG PO CAPS: ORAL_CAPSULE | ORAL | 1 refills | Status: DC

## 2022-04-23 MED ORDER — METFORMIN HCL ER 500 MG PO TB24: ORAL_TABLET | ORAL | 1 refills | Status: DC

## 2022-04-23 MED ORDER — PROPRANOLOL HCL 10 MG PO TABS: 10.0000 mg | ORAL_TABLET | Freq: Two times a day (BID) | ORAL | 4 refills | Status: DC

## 2022-04-23 MED ORDER — HYDROCODONE-ACETAMINOPHEN 10-325 MG PO TABS: ORAL_TABLET | ORAL | 0 refills | Status: DC

## 2022-04-23 MED ORDER — TORSEMIDE 20 MG PO TABS: 20.0000 mg | ORAL_TABLET | Freq: Every day | ORAL | 2 refills | Status: AC | PRN

## 2022-04-23 MED ORDER — HYDROCHLOROTHIAZIDE 25 MG PO TABS: ORAL_TABLET | ORAL | 3 refills | Status: DC

## 2022-04-23 MED ORDER — SERTRALINE HCL 100 MG PO TABS: ORAL_TABLET | ORAL | 3 refills | Status: DC

## 2022-04-23 MED ORDER — POTASSIUM CHLORIDE CRYS ER 20 MEQ PO TBCR: 20.0000 meq | EXTENDED_RELEASE_TABLET | Freq: Two times a day (BID) | ORAL | 3 refills | Status: DC

## 2022-04-23 MED ORDER — ROSUVASTATIN CALCIUM 5 MG PO TABS: ORAL_TABLET | ORAL | 3 refills | Status: DC

## 2022-04-23 NOTE — Progress Notes (Signed)
   Subjective:    Patient ID: Deborah Carroll, female    DOB: 05/26/1955, 67 y.o.   MRN: [id number]  HPI    Review of Systems     Objective:   Physical Exam        Assessment & Plan:

## 2022-04-23 NOTE — Progress Notes (Signed)
Subjective:    Patient ID: Deborah Carroll, female    DOB: [birth year], 67 y.o.   MRN: [id number]  HPI...Marland KitchenMarland KitchenThis patient was seen today for chronic pain  The medication list was reviewed and updated.  Location of Pain for which the patient has been treated with regarding narcotics: Back and feet  Onset of this pain: years   -Compliance with medication: yes  - Number patient states they take daily: 3 x daily  -when was the last dose patient took? 0930  The patient was advised the importance of maintaining medication and not using illegal substances with these.  Here for refills and follow up  The patient was educated that we can provide 3 monthly scripts for their medication, it is their responsibility to follow the instructions.  Side effects or complications from medications: no  Patient is aware that pain medications are meant to minimize the severity of the pain to allow their pain levels to improve to allow for better function. They are aware of that pain medications cannot totally remove their pain.  Due for UDT ( at least once per year) : 04/25/2021  Scale of 1 to 10 ( 1 is least 10 is most) Your pain level without the medicine: 9 Your pain level with medication 2  Scale 1 to 10 ( 1-helps very little, 10 helps very well) How well does your pain medication reduce your pain so you can function better through out the day? 9  Quality of the pain: stabbing, numbness  Persistence of the pain: consistent   Modifying factors: better  Leukocytosis, unspecified type - Plan: Lipid panel, Hepatic Function Panel, Basic Metabolic Panel, CBC with Differential  Hyperlipidemia associated with type 2 diabetes mellitus ([place]) - Plan: Lipid panel, Hepatic Function Panel, Basic Metabolic Panel  Encounter for long-term opiate analgesic use - Plan: ToxASSURE Select 13 (MW), Urine  Diabetes mellitus without complication ([place]) - Plan: Hemoglobin A1c  Idiopathic peripheral  neuropathy  Morbid obesity ([place]), Chronic  Seizures ([place]), Chronic  History of hyperlipidemia takes her Crestor regular basis tries to watch her diet History of seizures none recently on Dilantin we will go ahead with refills Denies reflux related issues but keeps them under good control meds History of low potassium takes her medicine along with history of pedal edema takes her medicine keeps it under control      Review of Systems     Objective:   Physical Exam   General-in no acute distress Eyes-no discharge Lungs-respiratory rate normal, CTA CV-no murmurs,RRR Extremities skin warm dry no edema Neuro grossly normal Behavior normal, alert      Assessment & Plan:  1. Leukocytosis, unspecified type CBC to look at white blood count slight elevation on previous lab work - Lipid panel - Hepatic Function Panel - Basic Metabolic Panel - CBC with Differential  2. Hyperlipidemia associated with type 2 diabetes mellitus ([facility]) Lipid profile continue current medications and healthy diet - Lipid panel - Hepatic Function Panel - Basic Metabolic Panel  3. Encounter for long-term opiate analgesic use The patient was seen in followup for chronic pain. A review over at their current pain status was discussed. Drug registry was checked. Prescriptions were given.  Regular follow-up recommended. Discussion was held regarding the importance of compliance with medication as well as pain medication contract.  Patient was informed that medication may cause drowsiness and should not be combined  with other medications/alcohol or street drugs. If the patient feels medication is causing altered alertness  then do not drive or operate dangerous equipment.  Should be noted that the patient appears to be meeting appropriate use of opioids and response.  Evidenced by improved function and decent pain control without significant side effects and no evidence of overt aberrancy issues.  Upon  discussion with the patient today they understand that opioid therapy is optional and they feel that the pain has been refractory to reasonable conservative measures and is significant and affecting quality of life enough to warrant ongoing therapy and wishes to continue opioids.  Refills were provided. 3 prescriptions written out sent in electronically - ToxASSURE Select 13 (MW), Urine  4. Diabetes mellitus without complication ([place]) Follow-up lab work healthy diet continue medication - Hemoglobin A1c  5. Idiopathic peripheral neuropathy Patient does have some numbness tingling at the end of her fingers but also to has neuropathy in the feet  Not sure if the patient would benefit from additional nerve conduction studies currently.  Will touch base with neurology regarding this  Also history of seizures we will reach out to neurology regarding if they work with individuals on these type of medicines about the possibility of coming off of the medicine

## 2022-04-24 LAB — HEPATIC FUNCTION PANEL
ALT: 20 IU/L (ref 0–32)
AST: 20 IU/L (ref 0–40)
Albumin: 4.4 g/dL (ref 3.9–4.9)
Alkaline Phosphatase: 142 IU/L — ABNORMAL HIGH (ref 44–121)
Bilirubin Total: 0.3 mg/dL (ref 0.0–1.2)
Bilirubin, Direct: 0.12 mg/dL (ref 0.00–0.40)
Total Protein: 7.1 g/dL (ref 6.0–8.5)

## 2022-04-24 LAB — CBC WITH DIFFERENTIAL/PLATELET
Basophils Absolute: 0 10*3/uL (ref 0.0–0.2)
Basos: 0 %
EOS (ABSOLUTE): 0.2 10*3/uL (ref 0.0–0.4)
Eos: 2 %
Hematocrit: 39.4 % (ref 34.0–46.6)
Hemoglobin: 13.5 g/dL (ref 11.1–15.9)
Immature Grans (Abs): 0.1 10*3/uL (ref 0.0–0.1)
Immature Granulocytes: 1 %
Lymphocytes Absolute: 3.3 10*3/uL — ABNORMAL HIGH (ref 0.7–3.1)
Lymphs: 29 %
MCH: 29.6 pg (ref 26.6–33.0)
MCHC: 34.3 g/dL (ref 31.5–35.7)
MCV: 86 fL (ref 79–97)
Monocytes Absolute: 0.6 10*3/uL (ref 0.1–0.9)
Monocytes: 5 %
Neutrophils Absolute: 7.3 10*3/uL — ABNORMAL HIGH (ref 1.4–7.0)
Neutrophils: 63 %
Platelets: 188 10*3/uL (ref 150–450)
RBC: 4.56 x10E6/uL (ref 3.77–5.28)
RDW: 12.5 % (ref 11.7–15.4)
WBC: 11.5 10*3/uL — ABNORMAL HIGH (ref 3.4–10.8)

## 2022-04-24 LAB — BASIC METABOLIC PANEL
BUN/Creatinine Ratio: 20 (ref 12–28)
BUN: 13 mg/dL (ref 8–27)
CO2: 27 mmol/L (ref 20–29)
Calcium: 9.6 mg/dL (ref 8.7–10.3)
Chloride: 100 mmol/L (ref 96–106)
Creatinine, Ser: 0.65 mg/dL (ref 0.57–1.00)
Glucose: 131 mg/dL — ABNORMAL HIGH (ref 70–99)
Potassium: 4.3 mmol/L (ref 3.5–5.2)
Sodium: 140 mmol/L (ref 134–144)
eGFR: 98 mL/min/{1.73_m2} (ref >59)

## 2022-04-24 LAB — LIPID PANEL
Chol/HDL Ratio: 2.6 ratio (ref 0.0–4.4)
Cholesterol, Total: 157 mg/dL (ref 100–199)
HDL: 60 mg/dL (ref >39)
LDL Chol Calc (NIH): 68 mg/dL (ref 0–99)
Triglycerides: 172 mg/dL — ABNORMAL HIGH (ref 0–149)
VLDL Cholesterol Cal: 29 mg/dL (ref 5–40)

## 2022-04-24 LAB — HEMOGLOBIN A1C
Est. average glucose Bld gHb Est-mCnc: 137 mg/dL
Hgb A1c MFr Bld: 6.4 % — ABNORMAL HIGH (ref 4.8–5.6)

## 2022-04-25 LAB — TOXASSURE SELECT 13 (MW), URINE

## 2022-04-29 ENCOUNTER — Encounter: Payer: Self-pay | Admitting: Family Medicine

## 2022-04-29 ENCOUNTER — Other Ambulatory Visit: Payer: Self-pay

## 2022-04-29 DIAGNOSIS — Z79891 Long term (current) use of opiate analgesic: Secondary | ICD-10-CM

## 2022-04-29 DIAGNOSIS — G4733 Obstructive sleep apnea (adult) (pediatric): Secondary | ICD-10-CM | POA: Diagnosis not present

## 2022-04-29 MED ORDER — PANTOPRAZOLE SODIUM 40 MG PO TBEC: 40.0000 mg | DELAYED_RELEASE_TABLET | Freq: Every day | ORAL | 3 refills | Status: DC

## 2022-04-29 MED ORDER — HYDROCODONE-ACETAMINOPHEN 10-325 MG PO TABS: ORAL_TABLET | ORAL | 0 refills | Status: DC

## 2022-04-29 MED ORDER — DICYCLOMINE HCL 10 MG PO CAPS: ORAL_CAPSULE | ORAL | 5 refills | Status: DC

## 2022-04-30 ENCOUNTER — Telehealth: Payer: Self-pay | Admitting: Family Medicine

## 2022-04-30 NOTE — Telephone Encounter (Signed)
Husband Kasandra Knudsen dropped off Disability parking placard. He would like to pick up on Friday when he has an appointment if anyway possible.  CB # [phone]

## 2022-04-30 NOTE — Telephone Encounter (Signed)
Form filled out and placed in provider office for review and signature. Please advise. Thank you

## 2022-05-01 ENCOUNTER — Encounter: Payer: Self-pay | Admitting: Family Medicine

## 2022-05-03 NOTE — Addendum Note (Signed)
Addended by: Dairl Ponder on: 05/03/2022 03:11 PM   Modules accepted: Orders

## 2022-05-06 NOTE — Telephone Encounter (Signed)
This was filled out

## 2022-05-07 NOTE — Telephone Encounter (Signed)
Form up front and ready for pickup. My chart message sent to patient

## 2022-05-08 NOTE — Progress Notes (Unsigned)
Office Visit    Patient Name: Deborah Carroll Date of Encounter: 05/09/2022  PCP:  Kathyrn Drown, MD   [place]  Cardiologist:  Freada Bergeron, MD  Advanced Practice Provider:  No care team member to display Electrophysiologist:  None   HPI    Deborah Carroll is a 67 y.o. female chest pain, anxiety, chronic pain, depression, hypertension, and morbid obesity presents today for overdue follow-up appointment.  She was seen in mid December 2021 and at that time had some chest pressure which records showing: "Wednesday night she helped her husband moving something heavy amounts.  By Thursday night she was complaining of midsternal chest pain that radiated to the back, shortness of breath, felt cold and sweaty and clammy some nausea.  They monitor her blood pressure and it was 180s over 100s and generally is 130s to 140s over 70s to 80s.  Pain on Thursday was an 8 out of 10 and by Saturday it was a 7-8 out of 10.  On Monday it was a 3-4 out of 10 when she was seen in the office it was a 3 out of 10 and she was complaining of some belching.  Reported SOB when taking a deep breath.  EKG done in the office did not show any obvious ischemia."  She went to the ED.  Negative troponins and EKG.  Discharged with cardiology follow-up.  Seen in the office 04/14/2020 and stated she had minimal chest pain in the center of her chest going to her back which was worse with emotional stress.  Family history includes her grandmother with CHF.  She has felt some skipped heartbeats as well.  She had a stress test many years ago but never had a cardiac catheterization.  She does walk regularly and has some DOE.  No discomfort in her chest without walking.  Given the risk factors that she has, plan for coronary CTA.  She had a coronary calcium score of 49.  She had minimal nonobstructive CAD.  Today, she states that she was referred from Dr. Wolfgang Phoenix for shortness of breath.  Occasionally  she feels like her breath is being taken away.  It happens every few months.  It is very intermittent.  She states that the episodes last for a few minutes then goes away.  It is nonexertional.  It can happen when she is standing still or moving around.  She does not have any other associated symptoms such as chest pain, palpitations, or lightheadedness.  She does occasionally get dizzy when she stands up. She is also due for a dental filling. She has had carpel tunnel surgery in the past and does have some numbness in her fingers. She thinks it might be due to metformin. She only needs her Demedex every couple of months. We reviewed her echo which was unremarkable and there was no clear cause of her SOB. She tells me the SOB is also related to a smell of cigarettes even though she is not around any smoke. She has a similar sensation when she had COVID.   Reports no chest pain, pressure, or tightness. No edema, orthopnea, PND. Reports no palpitations.   Past Medical History    Past Medical History:  Diagnosis Date   Anxiety    Chronic pain    Depression    Diabetes mellitus without complication ([place])    GERD (gastroesophageal reflux disease)    Hypertension    Morbid obesity ([place]) 03/20/2018  Neuropathy    Peripheral neuropathy    Peripheral neuropathy    Seizures ([facility])    last one 1992- Increased BP with delivery and is on meds still.   Sleep apnea    CPAP x 15 years   Past Surgical History:  Procedure Laterality Date   ABDOMINAL HYSTERECTOMY     ovaries present, no cancer,approx 2001   BACK SURGERY     BIOPSY  05/23/2020   Procedure: BIOPSY;  Surgeon: Eloise Harman, DO;  Location: [facility];  Service: Endoscopy;;   CARPAL TUNNEL RELEASE     Bilateral   CATARACT EXTRACTION W/PHACO Right 06/20/2015   Procedure: CATARACT EXTRACTION PHACO AND INTRAOCULAR LENS PLACEMENT (IOC);  Surgeon: Rutherford Guys, MD;  Location: [facility];  Service: Ophthalmology;  Laterality: Right;  CDE:6.25    CATARACT EXTRACTION W/PHACO Left 08/29/2015   Procedure: CATARACT EXTRACTION PHACO AND INTRAOCULAR LENS PLACEMENT (IOC);  Surgeon: Rutherford Guys, MD;  Location: [facility];  Service: Ophthalmology;  Laterality: Left;  CDE: 4.11   CESAREAN SECTION     x2   CHOLECYSTECTOMY     COLONOSCOPY  10/14/2002   JFH:[id number] involving descending colon, splenic flexure and distal transverse colon with most extensive inflammation in the splenic flexure area.  These changes are typical of ischemic colitis.  Biopsy taken.   COLONOSCOPY N/A 08/04/2012   SLF: POLYPOID LESION IN AT THE APPENDICEAL ORIFICE/Polyp  in the descending colon/Moderate melanosis throughout the entire examined colon/ The colon IS redundant. small tubular adenoma.    COLONOSCOPY WITH PROPOFOL N/A 11/17/2018   3 tubular adenomas removed, 3 hyperplastic polyps, next colonoscopy in 11/2021.    ESOPHAGOGASTRODUODENOSCOPY (EGD) WITH PROPOFOL N/A 05/23/2020   Procedure: ESOPHAGOGASTRODUODENOSCOPY (EGD) WITH PROPOFOL;  Surgeon: Eloise Harman, DO;  Location[facility];  Service: Endoscopy;  Laterality: N/A;  11:45am   FOOT SURGERY Bilateral 2004   hammer toe repairs   PARS PLANA VITRECTOMY W/ REPAIR OF MACULAR HOLE     POLYPECTOMY  11/17/2018   Procedure: POLYPECTOMY;  Surgeon: Danie Binder, MD;  [facility];  Service: Endoscopy;;   POLYPECTOMY  05/23/2020   Procedure: POLYPECTOMY;  Surgeon: Eloise Harman, DO;  Location[facility];  Service: Endoscopy;;    Allergies  No Known Allergies  EKGs/Labs/Other Studies Reviewed:   The following studies were reviewed today:  CT coronary 05/18/20  IMPRESSION: 1.  Minimal nonobstructive CAD, CADRADS = 1.   2. Coronary calcium score of 49. This was 70th percentile for age and sex matched control.   3. Normal coronary origin with right dominance.  EKG:  EKG is not ordered today.   Recent Labs: 10/17/2021: TSH 1.760 11/15/2021: Magnesium 2.3 03/06/2022: B Natriuretic  Peptide 30.0 04/23/2022: ALT 20; BUN 13; Creatinine, Ser 0.65; Hemoglobin 13.5; Platelets 188; Potassium 4.3; Sodium 140  Recent Lipid Panel    Component Value Date/Time   CHOL 157 04/23/2022 1214   TRIG 172 (H) 04/23/2022 1214   HDL 60 04/23/2022 1214   CHOLHDL 2.6 04/23/2022 1214   CHOLHDL 4.0 05/16/2014 1023   VLDL 33 05/16/2014 1023   LDLCALC 68 04/23/2022 1214    Home Medications   Current Meds  Medication Sig   dicyclomine (BENTYL) 10 MG capsule 1 taken 3 times daily as needed   hydrochlorothiazide (HYDRODIURIL) 25 MG tablet 1 qam   HYDROcodone-acetaminophen (NORCO) 10-325 MG tablet Take 1 tab three times daily as needed for pain   metFORMIN (GLUCOPHAGE-XR) 500 MG 24 hr tablet 1  every morning   pantoprazole (PROTONIX) 40 MG tablet Take 1 tablet (40 mg total) by mouth daily.   penicillin v potassium (VEETID) 500 MG tablet Take 500 mg by mouth 2 (two) times daily.   phenytoin (DILANTIN) 100 MG ER capsule TAKE 2 CAPSULES BY MOUTH EVERY MORNING AND 2 CAPSULES BY MOUTH EVERY EVENING   potassium chloride SA (KLOR-CON M) 20 MEQ tablet Take 1 tablet (20 mEq total) by mouth 2 (two) times daily.   propranolol (INDERAL) 10 MG tablet Take 1 tablet (10 mg total) by mouth 2 (two) times daily. Take 1 tablet (10 mg total) by mouth 2 (two) times daily for tremor   rosuvastatin (CRESTOR) 5 MG tablet TAKE 1 TABLET(5 MG) BY MOUTH DAILY   sertraline (ZOLOFT) 100 MG tablet TAKE 2 TABLETS BY MOUTH DAILY   torsemide (DEMADEX) 20 MG tablet Take 1 tablet (20 mg total) by mouth daily as needed (fluid or swelling).     Review of Systems      All other systems reviewed and are otherwise negative except as noted above.  Physical Exam    VS:  BP 116/82   Pulse (!) 53   Ht '5\' 2"'$  (1.575 m)   Wt 231 lb 3.2 oz (104.9 kg)   SpO2 96%   BMI 42.29 kg/m  , BMI Body mass index is 42.29 kg/m.  Wt Readings from Last 3 Encounters:  05/09/22 231 lb 3.2 oz (104.9 kg)  04/23/22 232 lb (105.2 kg)  03/05/22  226 lb (102.5 kg)     GEN: Well nourished, well developed, in no acute distress. HEENT: normal. Neck: Supple, no JVD, carotid bruits, or masses. Cardiac: RRR, no murmurs, rubs, or gallops. No clubbing, cyanosis, edema.  Radials/PT 2+ and equal bilaterally.  Respiratory:  Respirations regular and unlabored, clear to auscultation bilaterally. GI: Soft, nontender, nondistended. MS: No deformity or atrophy. Skin: Warm and dry, no rash. Neuro:  Strength and sensation are intact. Psych: Normal affect.  Assessment & Plan    Minimal CAD -No chest pain at this moment but does has SOB -With recent CTA would defer an ischemic workup for now  SOB -no clear cause -usually last a few minutes, happens randomly -nonexertional and associated with the smell of cigarettes which is similar to when she had COVID -Echo reviewed with Grade1 DD and normal EF no real culprit for her SOB  PreDM -She was started on Metformin to assist in lowering A1C  Morbid obesity -encouraged increase in exercise and maintaining a lipid lowering diet  HLD -recently checked with LDL of 68 -continue Crestor '5mg'$  daily      Disposition: Follow up 3 months with Freada Bergeron, MD or APP.  Signed, Elgie Collard, PA-C 05/09/2022, 6:58 PM [place]

## 2022-05-09 ENCOUNTER — Encounter: Payer: Self-pay | Admitting: Physician Assistant

## 2022-05-09 ENCOUNTER — Ambulatory Visit: Payer: PPO | Attending: Physician Assistant | Admitting: Physician Assistant

## 2022-05-09 VITALS — BP 116/82 | HR 53 | Ht 62.0 in | Wt 231.2 lb

## 2022-05-09 DIAGNOSIS — R7303 Prediabetes: Secondary | ICD-10-CM

## 2022-05-09 DIAGNOSIS — R0602 Shortness of breath: Secondary | ICD-10-CM | POA: Diagnosis not present

## 2022-05-09 DIAGNOSIS — I2510 Atherosclerotic heart disease of native coronary artery without angina pectoris: Secondary | ICD-10-CM

## 2022-05-09 DIAGNOSIS — E785 Hyperlipidemia, unspecified: Secondary | ICD-10-CM

## 2022-05-09 NOTE — Patient Instructions (Signed)
Medication Instructions:  Your physician recommends that you continue on your current medications as directed. Please refer to the Current Medication list given to you today.  *If you need a refill on your cardiac medications before your next appointment, please call your pharmacy*   Lab Work: None ordered If you have labs (blood work) drawn today and your tests are completely normal, you will receive your results only by: [place] (if you have MyChart) OR A paper copy in the mail If you have any lab test that is abnormal or we need to change your treatment, we will call you to review the results.   Follow-Up: At [facility], you and your health needs are our priority.  As part of our continuing mission to provide you with exceptional heart care, we have created designated Provider Care Teams.  These Care Teams include your primary Cardiologist (physician) and Advanced Practice Providers (APPs -  Physician Assistants and Nurse Practitioners) who all work together to provide you with the care you need, when you need it.  Your next appointment:   3 month(s)  Provider:   Freada Bergeron, MD

## 2022-05-22 NOTE — Progress Notes (Incomplete)
[facility] [address] [place] [address]   CLINIC:  Medical Oncology/Hematology  CONSULT NOTE  Patient Care Team: Kathyrn Drown, MD as PCP - General (Family Medicine) Freada Bergeron, MD as PCP - Cardiology (Cardiology) Danie Binder, MD (Inactive) as Attending Physician (Gastroenterology)  CHIEF COMPLAINTS/PURPOSE OF CONSULTATION:  ***   HISTORY OF PRESENTING ILLNESS:   Deborah Carroll 67 y.o. female is here at the request of ***  ***  PMH: ***  SOCIAL: ***  FAMILY: ***  MEDICAL HISTORY:  Past Medical History:  Diagnosis Date  . Anxiety   . Chronic pain   . Depression   . Diabetes mellitus without complication ([place])   . GERD (gastroesophageal reflux disease)   . Hypertension   . Morbid obesity ([place]) 03/20/2018  . Neuropathy   . Peripheral neuropathy   . Peripheral neuropathy   . Seizures ([place])    last one 1992- Increased BP with delivery and is on meds still.  . Sleep apnea    CPAP x 15 years    SURGICAL HISTORY: Past Surgical History:  Procedure Laterality Date  . ABDOMINAL HYSTERECTOMY     ovaries present, no cancer,approx 2001  . BACK SURGERY    . BIOPSY  05/23/2020   Procedure: BIOPSY;  Surgeon: Eloise Harman, DO;  [facility]: [facility];  Service: Endoscopy;;  . CARPAL TUNNEL RELEASE     Bilateral  . CATARACT EXTRACTION W/PHACO Right 06/20/2015   Procedure: CATARACT EXTRACTION PHACO AND INTRAOCULAR LENS PLACEMENT (IOC);  Surgeon: Rutherford Guys, MD;  Location: [facility];  Service: Ophthalmology;  Laterality: Right;  CDE:6.25  . CATARACT EXTRACTION W/PHACO Left 08/29/2015   Procedure: CATARACT EXTRACTION PHACO AND INTRAOCULAR LENS PLACEMENT (IOC);  Surgeon: Rutherford Guys, MD;  Location: [facility];  Service: Ophthalmology;  Laterality: Left;  CDE: 4.11  . CESAREAN SECTION     x2  . CHOLECYSTECTOMY    . COLONOSCOPY  10/14/2002   [id number] involving descending colon, splenic flexure and distal transverse colon with  most extensive inflammation in the splenic flexure area.  These changes are typical of ischemic colitis.  Biopsy taken.  . COLONOSCOPY N/A 08/04/2012   SLF: POLYPOID LESION IN AT THE APPENDICEAL ORIFICE/Polyp  in the descending colon/Moderate melanosis throughout the entire examined colon/ The colon IS redundant. small tubular adenoma.   . COLONOSCOPY WITH PROPOFOL N/A 11/17/2018   3 tubular adenomas removed, 3 hyperplastic polyps, next colonoscopy in 11/2021.   Marland Kitchen ESOPHAGOGASTRODUODENOSCOPY (EGD) WITH PROPOFOL N/A 05/23/2020   Procedure: ESOPHAGOGASTRODUODENOSCOPY (EGD) WITH PROPOFOL;  Surgeon: Eloise Harman, DO;  [facility];  Service: Endoscopy;  Laterality: N/A;  11:45am  . FOOT SURGERY Bilateral 2004   hammer toe repairs  . PARS PLANA VITRECTOMY W/ REPAIR OF MACULAR HOLE    . POLYPECTOMY  11/17/2018   Procedure: POLYPECTOMY;  Surgeon: Danie Binder, MD;  [facility];  Service: Endoscopy;;  . POLYPECTOMY  05/23/2020   Procedure: POLYPECTOMY;  Surgeon: Eloise Harman, DO;  [facility];  Service: Endoscopy;;    SOCIAL HISTORY: Social History   Socioeconomic History  . Marital status: Married    Spouse name: Not on file  . Number of children: 2  . Years of education: 73  . Highest education level: Not on file  Occupational History  . Occupation: disability  Tobacco Use  . Smoking status: Never  . Smokeless tobacco: Never  . Tobacco comments:  smoked x 4 years many years ago  Vaping Use  . Vaping Use: Never used  Substance and Sexual Activity  . Alcohol use: Not Currently  . Drug use: No  . Sexual activity: Yes    Birth control/protection: Surgical    Comment: hyst  Other Topics Concern  . Not on file  Social History Narrative   lives with husband   Social Determinants of Health   Financial Resource Strain: Low Risk  (12/04/2021)   Overall Financial Resource Strain (CARDIA)   . Difficulty of Paying Living Expenses: Not very hard   Food Insecurity: No Food Insecurity (12/04/2021)   Hunger Vital Sign   . Worried About Charity fundraiser in the Last Year: Never true   . Ran Out of Food in the Last Year: Never true  Transportation Needs: No Transportation Needs (12/04/2021)   PRAPARE - Transportation   . Lack of Transportation (Medical): No   . Lack of Transportation (Non-Medical): No  Physical Activity: Insufficiently Active (12/04/2021)   Exercise Vital Sign   . Days of Exercise per Week: 2 days   . Minutes of Exercise per Session: 20 min  Stress: No Stress Concern Present (12/04/2021)   [place]   . Feeling of Stress : Only a little  Social Connections: Moderately Integrated (12/04/2021)   Social Connection and Isolation Panel [NHANES]   . Frequency of Communication with Friends and Family: More than three times a week   . Frequency of Social Gatherings with Friends and Family: More than three times a week   . Attends Religious Services: More than 4 times per year   . Active Member of Clubs or Organizations: No   . Attends Archivist Meetings: Never   . Marital Status: Married  Human resources officer Violence: Not At Risk (12/04/2021)   Humiliation, Afraid, Rape, and Kick questionnaire   . Fear of Current or Ex-Partner: No   . Emotionally Abused: No   . Physically Abused: No   . Sexually Abused: No    FAMILY HISTORY: Family History  Problem Relation Age of Onset  . Hypertension Maternal Grandmother   . Heart attack Maternal Grandfather   . Emphysema Father   . Asthma Father   . Colon polyps Mother 63       benign   . Colon cancer Neg Hx   . Liver disease Neg Hx     ALLERGIES:  has No Known Allergies.  MEDICATIONS:  Current Outpatient Medications  Medication Sig Dispense Refill  . dicyclomine (BENTYL) 10 MG capsule 1 taken 3 times daily as needed 90 capsule 5  . hydrochlorothiazide (HYDRODIURIL) 25 MG tablet 1 qam 90 tablet 3   . HYDROcodone-acetaminophen (NORCO) 10-325 MG tablet Take 1 tab three times daily as needed for pain 90 tablet 0  . metFORMIN (GLUCOPHAGE-XR) 500 MG 24 hr tablet 1 every morning 90 tablet 1  . pantoprazole (PROTONIX) 40 MG tablet Take 1 tablet (40 mg total) by mouth daily. 90 tablet 3  . penicillin v potassium (VEETID) 500 MG tablet Take 500 mg by mouth 2 (two) times daily.    . phenytoin (DILANTIN) 100 MG ER capsule TAKE 2 CAPSULES BY MOUTH EVERY MORNING AND 2 CAPSULES BY MOUTH EVERY EVENING 360 capsule 1  . potassium chloride SA (KLOR-CON M) 20 MEQ tablet Take 1 tablet (20 mEq total) by mouth 2 (two) times daily. 180 tablet 3  . propranolol (INDERAL) 10 MG tablet Take  1 tablet (10 mg total) by mouth 2 (two) times daily. Take 1 tablet (10 mg total) by mouth 2 (two) times daily for tremor 60 tablet 4  . rosuvastatin (CRESTOR) 5 MG tablet TAKE 1 TABLET(5 MG) BY MOUTH DAILY 90 tablet 3  . sertraline (ZOLOFT) 100 MG tablet TAKE 2 TABLETS BY MOUTH DAILY 180 tablet 3  . torsemide (DEMADEX) 20 MG tablet Take 1 tablet (20 mg total) by mouth daily as needed (fluid or swelling). 30 tablet 2   No current facility-administered medications for this visit.    REVIEW OF SYSTEMS:  ***  Review of Systems - Oncology    PHYSICAL EXAMINATION:   ECOG PERFORMANCE STATUS: {CHL ONC ECOG [id number] *** There were no vitals filed for this visit. There were no vitals filed for this visit.  Physical Exam    LABORATORY DATA:  I have reviewed the data as listed Recent Results (from the past 2160 hour(s))  C-reactive protein     Status: Abnormal   Collection Time: 03/05/22  4:34 PM  Result Value Ref Range   CRP 22 (H) 0 - 10 mg/L  CBC with Differential     Status: Abnormal   Collection Time: 03/05/22  4:34 PM  Result Value Ref Range   WBC 13.6 (H) 3.4 - 10.8 x10E3/uL   RBC 4.67 3.77 - 5.28 x10E6/uL   Hemoglobin 14.1 11.1 - 15.9 g/dL   Hematocrit 39.5 34.0 - 46.6 %   MCV 85 79 - 97 fL   MCH 30.2 26.6  - 33.0 pg   MCHC 35.7 31.5 - 35.7 g/dL   RDW 12.8 11.7 - 15.4 %   Platelets 202 150 - 450 x10E3/uL   Neutrophils 58 Not Estab. %   Lymphs 33 Not Estab. %   Monocytes 7 Not Estab. %   Eos 2 Not Estab. %   Basos 0 Not Estab. %   Neutrophils Absolute 7.8 (H) 1.4 - 7.0 x10E3/uL   Lymphocytes Absolute 4.6 (H) 0.7 - 3.1 x10E3/uL   Monocytes Absolute 0.9 0.1 - 0.9 x10E3/uL   EOS (ABSOLUTE) 0.2 0.0 - 0.4 x10E3/uL   Basophils Absolute 0.0 0.0 - 0.2 x10E3/uL   Immature Granulocytes 0 Not Estab. %   Immature Grans (Abs) 0.0 0.0 - 0.1 x10E3/uL  Lipase     Status: None   Collection Time: 03/05/22  4:34 PM  Result Value Ref Range   Lipase 42 14 - 72 U/L  Hepatic Function Panel     Status: Abnormal   Collection Time: 03/05/22  4:34 PM  Result Value Ref Range   Total Protein 7.3 6.0 - 8.5 g/dL   Albumin 4.4 3.9 - 4.9 g/dL   Bilirubin Total 0.3 0.0 - 1.2 mg/dL   Bilirubin, Direct 0.13 0.00 - 0.40 mg/dL   Alkaline Phosphatase 146 (H) 44 - 121 IU/L   AST 27 0 - 40 IU/L   ALT 19 0 - 32 IU/L  Basic Metabolic Panel     Status: Abnormal   Collection Time: 03/05/22  4:34 PM  Result Value Ref Range   Glucose 148 (H) 70 - 99 mg/dL   BUN 11 8 - 27 mg/dL   Creatinine, Ser 0.68 0.57 - 1.00 mg/dL   eGFR 97 >59 mL/min/1.73   BUN/Creatinine Ratio 16 12 - 28   Sodium 140 134 - 144 mmol/L   Potassium 3.4 (L) 3.5 - 5.2 mmol/L   Chloride 95 (L) 96 - 106 mmol/L   CO2 30 (H) 20 - 29  mmol/L   Calcium 9.2 8.7 - 10.3 mg/dL  Troponin I (High Sensitivity)     Status: None   Collection Time: 03/06/22 12:39 PM  Result Value Ref Range   Troponin I (High Sensitivity) 4 <18 ng/L    Comment: (NOTE) Elevated high sensitivity troponin I (hsTnI) values and significant  changes across serial measurements may suggest ACS but many other  chronic and acute conditions are known to elevate hsTnI results.  Refer to the "Links" section for chest pain algorithms and additional  guidance. Performed at [facility],  [address] [place], [place] [id number]   Basic metabolic panel     Status: Abnormal   Collection Time: 03/06/22 12:39 PM  Result Value Ref Range   Sodium 136 135 - 145 mmol/L   Potassium 3.0 (L) 3.5 - 5.1 mmol/L   Chloride 96 (L) 98 - 111 mmol/L   CO2 29 22 - 32 mmol/L   Glucose, Bld 154 (H) 70 - 99 mg/dL    Comment: Glucose reference range applies only to samples taken after fasting for at least 8 hours.   BUN 16 8 - 23 mg/dL   Creatinine, Ser 0.84 0.44 - 1.00 mg/dL   Calcium 8.6 (L) 8.9 - 10.3 mg/dL   GFR, Estimated >60 >60 mL/min    Comment: (NOTE) Calculated using the CKD-EPI Creatinine Equation (2021)    Anion gap 11 5 - 15    Comment: Performed at [facility], [address] [place], [place] [address]  Brain natriuretic peptide     Status: None   Collection Time: 03/06/22 12:42 PM  Result Value Ref Range   B Natriuretic Peptide 30.0 0.0 - 100.0 pg/mL    Comment: Performed at [facility], [address], [place], [place] [address]  ECHOCARDIOGRAM COMPLETE     Status: None   Collection Time: 03/06/22  2:22 PM  Result Value Ref Range   Area-P 1/2 2.62 cm2   S' Lateral 2.50 cm  ToxASSURE Select 13 (MW), Urine     Status: None   Collection Time: 04/23/22 12:00 AM  Result Value Ref Range   Summary Note     Comment: ==================================================================== ToxASSURE Select 13 (MW) ==================================================================== Test                             Result       Flag       Units  Drug Present and Declared for Prescription Verification   Hydrocodone                    1171         EXPECTED   ng/mg creat   Hydromorphone                  177          EXPECTED   ng/mg creat   Dihydrocodeine                 231          EXPECTED   ng/mg creat   Norhydrocodone                 3054         EXPECTED   ng/mg creat    Sources of hydrocodone include scheduled prescription medications.    Hydromorphone, dihydrocodeine and  norhydrocodone are expected    metabolites of hydrocodone. Hydromorphone and dihydrocodeine are  also available as scheduled prescription medications.  Drug Absent but Declared for Prescription Verification   Alprazolam                     Not Detected UNEXPECTED ng/mg creat ======================================= ============================= Test                      Result    Flag   Units      Ref Range   Creatinine              140              mg/dL      >=20 ==================================================================== Declared Medications:  The flagging and interpretation on this report are based on the  following declared medications.  Unexpected results may arise from  inaccuracies in the declared medications.   **Note: The testing scope of this panel includes these medications:   Alprazolam  Hydrocodone   **Note: The testing scope of this panel does not include the  following reported medications:   Sertraline (Zoloft) ==================================================================== For clinical consultation, please call [phone]. ====================================================================   Lipid panel     Status: Abnormal   Collection Time: 04/23/22 12:14 PM  Result Value Ref Range   Cholesterol, Total 157 100 - 199 mg/dL   Triglycerides 172 (H) 0 - 149 mg/dL   HDL 60 >39 mg/dL   VLDL Cholesterol Cal 29 5 - 40 mg/dL   LDL Chol Calc (NIH) 68 0 - 99 mg/dL   Chol/HDL Ratio 2.6 0.0 - 4.4 ratio    Comment:                                   T. Chol/HDL Ratio                                             Men  Women                               1/2 Avg.Risk  3.4    3.3                                   Avg.Risk  5.0    4.4                                2X Avg.Risk  9.6    7.1                                3X Avg.Risk 23.4   11.0   Hepatic Function Panel     Status: Abnormal   Collection Time: 04/23/22 12:14 PM  Result Value Ref Range    Total Protein 7.1 6.0 - 8.5 g/dL   Albumin 4.4 3.9 - 4.9 g/dL   Bilirubin Total 0.3 0.0 - 1.2 mg/dL   Bilirubin, Direct 0.12 0.00 - 0.40 mg/dL   Alkaline Phosphatase 142 (H) 44 - 121 IU/L   AST 20 0 - 40 IU/L   ALT 20 0 - 32 IU/L  Hemoglobin A1c     Status: Abnormal   Collection Time: 04/23/22 12:14 PM  Result Value Ref Range   Hgb A1c MFr Bld 6.4 (H) 4.8 - 5.6 %    Comment:          Prediabetes: 5.7 - 6.4          Diabetes: >6.4          Glycemic control for adults with diabetes: <7.0    Est. average glucose Bld gHb Est-mCnc 137 mg/dL  Basic Metabolic Panel     Status: Abnormal   Collection Time: 04/23/22 12:14 PM  Result Value Ref Range   Glucose 131 (H) 70 - 99 mg/dL   BUN 13 8 - 27 mg/dL   Creatinine, Ser 0.65 0.57 - 1.00 mg/dL   eGFR 98 >59 mL/min/1.73   BUN/Creatinine Ratio 20 12 - 28   Sodium 140 134 - 144 mmol/L   Potassium 4.3 3.5 - 5.2 mmol/L   Chloride 100 96 - 106 mmol/L   CO2 27 20 - 29 mmol/L   Calcium 9.6 8.7 - 10.3 mg/dL  CBC with Differential     Status: Abnormal   Collection Time: 04/23/22 12:14 PM  Result Value Ref Range   WBC 11.5 (H) 3.4 - 10.8 x10E3/uL   RBC 4.56 3.77 - 5.28 x10E6/uL   Hemoglobin 13.5 11.1 - 15.9 g/dL   Hematocrit 39.4 34.0 - 46.6 %   MCV 86 79 - 97 fL   MCH 29.6 26.6 - 33.0 pg   MCHC 34.3 31.5 - 35.7 g/dL   RDW 12.5 11.7 - 15.4 %   Platelets 188 150 - 450 x10E3/uL   Neutrophils 63 Not Estab. %   Lymphs 29 Not Estab. %   Monocytes 5 Not Estab. %   Eos 2 Not Estab. %   Basos 0 Not Estab. %   Neutrophils Absolute 7.3 (H) 1.4 - 7.0 x10E3/uL   Lymphocytes Absolute 3.3 (H) 0.7 - 3.1 x10E3/uL   Monocytes Absolute 0.6 0.1 - 0.9 x10E3/uL   EOS (ABSOLUTE) 0.2 0.0 - 0.4 x10E3/uL   Basophils Absolute 0.0 0.0 - 0.2 x10E3/uL   Immature Granulocytes 1 Not Estab. %   Immature Grans (Abs) 0.1 0.0 - 0.1 x10E3/uL    RADIOGRAPHIC STUDIES: I have personally reviewed the radiological images as listed and agreed with the findings in the  report. No results found.   ASSESSMENT & PLAN:  1.  *** - *** - PLAN: ***   ***.  Other history - ***   PLAN SUMMARY: >> *** >> *** >> ***   All questions were answered. The patient knows to call the clinic with any problems, questions or concerns.  Medical decision making: ***  Time spent on visit: I spent *** minutes counseling the patient face to face. The total time spent in the appointment was *** minutes and more than 50% was on counseling.  I, Tarri Abernethy PA-C, have seen this patient in conjunction with Dr. Derek Jack.  Greater than 50% of visit was performed by Dr. Delton Coombes.   Harriett Rush, PA-C *** ***  DR. Delton CoombesMarland Kitchen

## 2022-05-23 ENCOUNTER — Inpatient Hospital Stay: Payer: PPO | Admitting: Hematology

## 2022-06-04 ENCOUNTER — Encounter: Payer: PPO | Admitting: Hematology

## 2022-06-06 NOTE — Progress Notes (Shared)
[place] [address] [place] [address]   Clinic Day:  06/06/2022  Referring physician: Kathyrn Drown, MD  Patient Care Team: Kathyrn Drown, MD as PCP - General (Family Medicine) Freada Bergeron, MD as PCP - Cardiology (Cardiology) Danie Binder, MD (Inactive) as Attending Physician (Gastroenterology)   ASSESSMENT & PLAN:   Assessment: ***  Plan: ***  No orders of the defined types were placed in this encounter.     Beverly Gust Oliver,acting as a scribe for Derek Jack, MD.,have documented all relevant documentation on the behalf of Derek Jack, MD,as directed by  Derek Jack, MD while in the presence of Derek Jack, MD.   ***  Alric Ran Andreas Ohm   2/29/202411:17 AM  CHIEF COMPLAINT/PURPOSE OF CONSULT:   Diagnosis: Leukocytosis   Current Therapy:  ***  HISTORY OF PRESENT ILLNESS:   Deborah Carroll is a 67 y.o. female presenting to clinic today for evaluation of Leukocytosis  at the request of [place].  Today, she states that she is doing well overall. Her appetite level is at ***%. Her energy level is at ***%.  ***She was found to have abnormal CBC from *** ***She denies recent chest pain on exertion, shortness of breath on minimal exertion, pre-syncopal episodes, or palpitations. ***She had not noticed any recent bleeding such as epistaxis, hematuria or hematochezia ***The patient denies over the counter NSAID ingestion. She is not *** on antiplatelets agents. Her last colonoscopy was *** ***She had no prior history or diagnosis of cancer. She denies any family history of cancer.  *** Her age appropriate screening programs are up-to-date. ***She denies any pica and eats a variety of diet. ***She never donated blood or received blood transfusion. ***The patient was prescribed oral iron supplements and she takes ***  PAST MEDICAL HISTORY:   Past Medical History: Past Medical History:   Diagnosis Date   Anxiety    Chronic pain    Depression    Diabetes mellitus without complication (HCC)    GERD (gastroesophageal reflux disease)    Hypertension    Morbid obesity ([place]) 03/20/2018   Neuropathy    Peripheral neuropathy    Peripheral neuropathy    Seizures ([place])    last one 1992- Increased BP with delivery and is on meds still.   Sleep apnea    CPAP x 15 years    Surgical History: Past Surgical History:  Procedure Laterality Date   ABDOMINAL HYSTERECTOMY     ovaries present, no cancer,approx 2001   BACK SURGERY     BIOPSY  05/23/2020   Procedure: BIOPSY;  Surgeon: Eloise Harman, DO;  [facility];  Service: Endoscopy;;   CARPAL TUNNEL RELEASE     Bilateral   CATARACT EXTRACTION W/PHACO Right 06/20/2015   Procedure: CATARACT EXTRACTION PHACO AND INTRAOCULAR LENS PLACEMENT (IOC);  Surgeon: Rutherford Guys, MD;  Location: [facility];  Service: Ophthalmology;  Laterality: Right;  CDE:6.25   CATARACT EXTRACTION W/PHACO Left 08/29/2015   Procedure: CATARACT EXTRACTION PHACO AND INTRAOCULAR LENS PLACEMENT (IOC);  Surgeon: Rutherford Guys, MD;  Location: [facility];  Service: Ophthalmology;  Laterality: Left;  CDE: 4.11   CESAREAN SECTION     x2   CHOLECYSTECTOMY     COLONOSCOPY  10/14/2002   [id number] involving descending colon, splenic flexure and distal transverse colon with most extensive inflammation in the splenic flexure area.  These changes are typical of ischemic colitis.  Biopsy taken.   COLONOSCOPY N/A 08/04/2012  SLF: POLYPOID LESION IN AT THE APPENDICEAL ORIFICE/Polyp  in the descending colon/Moderate melanosis throughout the entire examined colon/ The colon IS redundant. small tubular adenoma.    COLONOSCOPY WITH PROPOFOL N/A 11/17/2018   3 tubular adenomas removed, 3 hyperplastic polyps, next colonoscopy in 11/2021.    ESOPHAGOGASTRODUODENOSCOPY (EGD) WITH PROPOFOL N/A 05/23/2020   Procedure: ESOPHAGOGASTRODUODENOSCOPY (EGD) WITH PROPOFOL;  Surgeon:  Eloise Harman, DO;  [facility];  Service: Endoscopy;  Laterality: N/A;  11:45am   FOOT SURGERY Bilateral 2004   hammer toe repairs   PARS PLANA VITRECTOMY W/ REPAIR OF MACULAR HOLE     POLYPECTOMY  11/17/2018   Procedure: POLYPECTOMY;  Surgeon: Danie Binder, MD;  [facility];  Service: Endoscopy;;   POLYPECTOMY  05/23/2020   Procedure: POLYPECTOMY;  Surgeon: Eloise Harman, DO;  [facility];  Service: Endoscopy;;    Social History: Social History   Socioeconomic History   Marital status: Married    Spouse name: Not on file   Number of children: 2   Years of education: 12   Highest education level: Not on file  Occupational History   Occupation: disability  Tobacco Use   Smoking status: Never   Smokeless tobacco: Never   Tobacco comments:    smoked x 4 years many years ago  Vaping Use   Vaping Use: Never used  Substance and Sexual Activity   Alcohol use: Not Currently   Drug use: No   Sexual activity: Yes    Birth control/protection: Surgical    Comment: hyst  Other Topics Concern   Not on file  Social History Narrative   lives with husband   Social Determinants of Health   Financial Resource Strain: Low Risk  (12/04/2021)   Overall Financial Resource Strain (CARDIA)    Difficulty of Paying Living Expenses: Not very hard  Food Insecurity: No Food Insecurity (12/04/2021)   Hunger Vital Sign    Worried About Running Out of Food in the Last Year: Never true    Ran Out of Food in the Last Year: Never true  Transportation Needs: No Transportation Needs (12/04/2021)   PRAPARE - Hydrologist (Medical): No    Lack of Transportation (Non-Medical): No  Physical Activity: Insufficiently Active (12/04/2021)   Exercise Vital Sign    Days of Exercise per Week: 2 days    Minutes of Exercise per Session: 20 min  Stress: No Stress Concern Present (12/04/2021)   [place]    Feeling of Stress : Only a little  Social Connections: Moderately Integrated (12/04/2021)   Social Connection and Isolation Panel [NHANES]    Frequency of Communication with Friends and Family: More than three times a week    Frequency of Social Gatherings with Friends and Family: More than three times a week    Attends Religious Services: More than 4 times per year    Active Member of [employer] or Organizations: No    Attends Archivist Meetings: Never    Marital Status: Married  Human resources officer Violence: Not At Risk (12/04/2021)   Humiliation, Afraid, Rape, and Kick questionnaire    Fear of Current or Ex-Partner: No    Emotionally Abused: No    Physically Abused: No    Sexually Abused: No    Family History: Family History  Problem Relation Age of Onset   Hypertension Maternal Grandmother    Heart attack  Maternal Grandfather    Emphysema Father    Asthma Father    Colon polyps Mother 10       benign    Colon cancer Neg Hx    Liver disease Neg Hx     Current Medications:  Current Outpatient Medications:    dicyclomine (BENTYL) 10 MG capsule, 1 taken 3 times daily as needed, Disp: 90 capsule, Rfl: 5   hydrochlorothiazide (HYDRODIURIL) 25 MG tablet, 1 qam, Disp: 90 tablet, Rfl: 3   HYDROcodone-acetaminophen (NORCO) 10-325 MG tablet, Take 1 tab three times daily as needed for pain, Disp: 90 tablet, Rfl: 0   metFORMIN (GLUCOPHAGE-XR) 500 MG 24 hr tablet, 1 every morning, Disp: 90 tablet, Rfl: 1   pantoprazole (PROTONIX) 40 MG tablet, Take 1 tablet (40 mg total) by mouth daily., Disp: 90 tablet, Rfl: 3   penicillin v potassium (VEETID) 500 MG tablet, Take 500 mg by mouth 2 (two) times daily., Disp: , Rfl:    phenytoin (DILANTIN) 100 MG ER capsule, TAKE 2 CAPSULES BY MOUTH EVERY MORNING AND 2 CAPSULES BY MOUTH EVERY EVENING, Disp: 360 capsule, Rfl: 1   potassium chloride SA (KLOR-CON M) 20 MEQ tablet, Take 1 tablet (20 mEq total) by mouth 2  (two) times daily., Disp: 180 tablet, Rfl: 3   propranolol (INDERAL) 10 MG tablet, Take 1 tablet (10 mg total) by mouth 2 (two) times daily. Take 1 tablet (10 mg total) by mouth 2 (two) times daily for tremor, Disp: 60 tablet, Rfl: 4   rosuvastatin (CRESTOR) 5 MG tablet, TAKE 1 TABLET(5 MG) BY MOUTH DAILY, Disp: 90 tablet, Rfl: 3   sertraline (ZOLOFT) 100 MG tablet, TAKE 2 TABLETS BY MOUTH DAILY, Disp: 180 tablet, Rfl: 3   torsemide (DEMADEX) 20 MG tablet, Take 1 tablet (20 mg total) by mouth daily as needed (fluid or swelling)., Disp: 30 tablet, Rfl: 2   Allergies: No Known Allergies  REVIEW OF SYSTEMS:   Review of Systems - Oncology   VITALS:   There were no vitals taken for this visit.  Wt Readings from Last 3 Encounters:  05/09/22 231 lb 3.2 oz (104.9 kg)  04/23/22 232 lb (105.2 kg)  03/05/22 226 lb (102.5 kg)    There is no height or weight on file to calculate BMI.   PHYSICAL EXAM:   Physical Exam  LABS:      Latest Ref Rng & Units 04/23/2022   12:14 PM 03/05/2022    4:34 PM 03/20/2020    6:15 PM  CBC  WBC 3.4 - 10.8 x10E3/uL 11.5  13.6  14.7   Hemoglobin 11.1 - 15.9 g/dL 13.5  14.1  14.4   Hematocrit 34.0 - 46.6 % 39.4  39.5  43.4   Platelets 150 - 450 x10E3/uL 188  202  229       Latest Ref Rng & Units 04/23/2022   12:14 PM 03/06/2022   12:39 PM 03/05/2022    4:34 PM  CMP  Glucose 70 - 99 mg/dL 131  154  148   BUN 8 - 27 mg/dL '13  16  11   '$ Creatinine 0.57 - 1.00 mg/dL 0.65  0.84  0.68   Sodium 134 - 144 mmol/L 140  136  140   Potassium 3.5 - 5.2 mmol/L 4.3  3.0  3.4   Chloride 96 - 106 mmol/L 100  96  95   CO2 20 - 29 mmol/L '27  29  30   '$ Calcium 8.7 - 10.3 mg/dL 9.6  8.6  9.2   Total Protein 6.0 - 8.5 g/dL 7.1   7.3   Total Bilirubin 0.0 - 1.2 mg/dL 0.3   0.3   Alkaline Phos 44 - 121 IU/L 142   146   AST 0 - 40 IU/L 20   27   ALT 0 - 32 IU/L 20   19      No results found for: "CEA1", "CEA" / No results found for: "CEA1", "CEA" No results found  for: "PSA1" No results found for: "[id number]" No results found for: "CAN125"  No results found for: "TOTALPROTELP", "ALBUMINELP", "A1GS", "A2GS", "BETS", "BETA2SER", "GAMS", "MSPIKE", "SPEI" Lab Results  Component Value Date   FERRITIN 139 08/13/2017   Lab Results  Component Value Date   LDH 235 (H) 08/13/2017     STUDIES:   No results found.

## 2022-06-07 ENCOUNTER — Inpatient Hospital Stay: Payer: PPO | Attending: Hematology | Admitting: Hematology

## 2022-06-07 NOTE — Progress Notes (Shared)
[place] [address], [place] [address]   Clinic Day:  06/07/2022  Referring physician: Kathyrn Drown, MD  Patient Care Team: Kathyrn Drown, MD as PCP - General (Family Medicine) Freada Bergeron, MD as PCP - Cardiology (Cardiology) Danie Binder, MD (Inactive) as Attending Physician (Gastroenterology)   ASSESSMENT & PLAN:   Assessment: ***  Plan: ***  No orders of the defined types were placed in this encounter.     I,Alexis Herring,acting as a Education administrator for [employer], MD.,have documented all relevant documentation on the behalf of Derek Jack, MD,as directed by  Derek Jack, MD while in the presence of Derek Jack, MD.   ***  Deborah Carroll Herring   3/1/202412:38 PM  CHIEF COMPLAINT/PURPOSE OF CONSULT:   Diagnosis: Leukocytosis  Current Therapy:  ***  HISTORY OF PRESENT ILLNESS:   Deborah Carroll is a 67 y.o. female presenting to clinic today for evaluation of leukocytosis at the request of her PCP Dr. Sallee Lange.  Her last CBC results from 04/23/22 showed a WBC count of 11.5.  Today, she states that she is doing well overall. Her appetite level is at ***%. Her energy level is at ***%.  ***She was found to have abnormal CBC from *** ***She denies recent chest pain on exertion, shortness of breath on minimal exertion, pre-syncopal episodes, or palpitations. ***She had not noticed any recent bleeding such as epistaxis, hematuria or hematochezia ***The patient denies over the counter NSAID ingestion. She is not *** on antiplatelets agents.  Her last colonoscopy was 11/17/2018. Polyps biopsy showed: tubular adenoma no high grade dysplasia or malignancy.  ***She had no prior history or diagnosis of cancer. She denies any family history of cancer.  *** Her age appropriate screening programs are up-to-date. ***She denies any pica and eats a variety of diet. ***She never donated blood or received blood  transfusion. ***The patient was prescribed oral iron supplements and she takes ***  PAST MEDICAL HISTORY:   Past Medical History: Past Medical History:  Diagnosis Date   Anxiety    Chronic pain    Depression    Diabetes mellitus without complication (HCC)    GERD (gastroesophageal reflux disease)    Hypertension    Morbid obesity ([place]) 03/20/2018   Neuropathy    Peripheral neuropathy    Peripheral neuropathy    Seizures ([place])    last one 1992- Increased BP with delivery and is on meds still.   Sleep apnea    CPAP x 15 years    Surgical History: Past Surgical History:  Procedure Laterality Date   ABDOMINAL HYSTERECTOMY     ovaries present, no cancer,approx 2001   BACK SURGERY     BIOPSY  05/23/2020   Procedure: BIOPSY;  Surgeon: Eloise Harman, DO;  [facility];  Service: Endoscopy;;   CARPAL TUNNEL RELEASE     Bilateral   CATARACT EXTRACTION W/PHACO Right 06/20/2015   Procedure: CATARACT EXTRACTION PHACO AND INTRAOCULAR LENS PLACEMENT (IOC);  Surgeon: Rutherford Guys, MD;  Location[facility];  Service: Ophthalmology;  Laterality: Right;  CDE:6.25   CATARACT EXTRACTION W/PHACO Left 08/29/2015   Procedure: CATARACT EXTRACTION PHACO AND INTRAOCULAR LENS PLACEMENT (IOC);  Surgeon: Rutherford Guys, MD;  [facility];  Service: Ophthalmology;  Laterality: Left;  CDE: 4.11   CESAREAN SECTION     x2   CHOLECYSTECTOMY     COLONOSCOPY  10/14/2002   [id number] involving descending colon, splenic flexure and distal transverse colon with  most extensive inflammation in the splenic flexure area.  These changes are typical of ischemic colitis.  Biopsy taken.   COLONOSCOPY N/A 08/04/2012   SLF: POLYPOID LESION IN AT THE APPENDICEAL ORIFICE/Polyp  in the descending colon/Moderate melanosis throughout the entire examined colon/ The colon IS redundant. small tubular adenoma.    COLONOSCOPY WITH PROPOFOL N/A 11/17/2018   3 tubular adenomas removed, 3 hyperplastic polyps, next  colonoscopy in 11/2021.    ESOPHAGOGASTRODUODENOSCOPY (EGD) WITH PROPOFOL N/A 05/23/2020   Procedure: ESOPHAGOGASTRODUODENOSCOPY (EGD) WITH PROPOFOL;  Surgeon: Eloise Harman, DO;  [facility];  Service: Endoscopy;  Laterality: N/A;  11:45am   FOOT SURGERY Bilateral 2004   hammer toe repairs   PARS PLANA VITRECTOMY W/ REPAIR OF MACULAR HOLE     POLYPECTOMY  11/17/2018   Procedure: POLYPECTOMY;  Surgeon: Danie Binder, MD;  [facility];  Service: Endoscopy;;   POLYPECTOMY  05/23/2020   Procedure: POLYPECTOMY;  Surgeon: Eloise Harman, DO;  [facility];  Service: Endoscopy;;    Social History: Social History   Socioeconomic History   Marital status: Married    Spouse name: Not on file   Number of children: 2   Years of education: 12   Highest education level: Not on file  Occupational History   Occupation: disability  Tobacco Use   Smoking status: Never   Smokeless tobacco: Never   Tobacco comments:    smoked x 4 years many years ago  Vaping Use   Vaping Use: Never used  Substance and Sexual Activity   Alcohol use: Not Currently   Drug use: No   Sexual activity: Yes    Birth control/protection: Surgical    Comment: hyst  Other Topics Concern   Not on file  Social History Narrative   lives with husband   Social Determinants of Health   Financial Resource Strain: Low Risk  (12/04/2021)   Overall Financial Resource Strain (CARDIA)    Difficulty of Paying Living Expenses: Not very hard  Food Insecurity: No Food Insecurity (12/04/2021)   Hunger Vital Sign    Worried About Running Out of Food in the Last Year: Never true    Ran Out of Food in the Last Year: Never true  Transportation Needs: No Transportation Needs (12/04/2021)   PRAPARE - Hydrologist (Medical): No    Lack of Transportation (Non-Medical): No  Physical Activity: Insufficiently Active (12/04/2021)   Exercise Vital Sign    Days of Exercise  per Week: 2 days    Minutes of Exercise per Session: 20 min  Stress: No Stress Concern Present (12/04/2021)   [place]    Feeling of Stress : Only a little  Social Connections: Moderately Integrated (12/04/2021)   Social Connection and Isolation Panel [NHANES]    Frequency of Communication with Friends and Family: More than three times a week    Frequency of Social Gatherings with Friends and Family: More than three times a week    Attends Religious Services: More than 4 times per year    Active Member of [employer] or Organizations: No    Attends Archivist Meetings: Never    Marital Status: Married  Human resources officer Violence: Not At Risk (12/04/2021)   Humiliation, Afraid, Rape, and Kick questionnaire    Fear of Current or Ex-Partner: No    Emotionally Abused: No    Physically Abused: No  Sexually Abused: No    Family History: Family History  Problem Relation Age of Onset   Hypertension Maternal Grandmother    Heart attack Maternal Grandfather    Emphysema Father    Asthma Father    Colon polyps Mother 33       benign    Colon cancer Neg Hx    Liver disease Neg Hx     Current Medications:  Current Outpatient Medications:    dicyclomine (BENTYL) 10 MG capsule, 1 taken 3 times daily as needed, Disp: 90 capsule, Rfl: 5   hydrochlorothiazide (HYDRODIURIL) 25 MG tablet, 1 qam, Disp: 90 tablet, Rfl: 3   HYDROcodone-acetaminophen (NORCO) 10-325 MG tablet, Take 1 tab three times daily as needed for pain, Disp: 90 tablet, Rfl: 0   metFORMIN (GLUCOPHAGE-XR) 500 MG 24 hr tablet, 1 every morning, Disp: 90 tablet, Rfl: 1   pantoprazole (PROTONIX) 40 MG tablet, Take 1 tablet (40 mg total) by mouth daily., Disp: 90 tablet, Rfl: 3   penicillin v potassium (VEETID) 500 MG tablet, Take 500 mg by mouth 2 (two) times daily., Disp: , Rfl:    phenytoin (DILANTIN) 100 MG ER capsule, TAKE 2 CAPSULES BY MOUTH EVERY MORNING  AND 2 CAPSULES BY MOUTH EVERY EVENING, Disp: 360 capsule, Rfl: 1   potassium chloride SA (KLOR-CON M) 20 MEQ tablet, Take 1 tablet (20 mEq total) by mouth 2 (two) times daily., Disp: 180 tablet, Rfl: 3   propranolol (INDERAL) 10 MG tablet, Take 1 tablet (10 mg total) by mouth 2 (two) times daily. Take 1 tablet (10 mg total) by mouth 2 (two) times daily for tremor, Disp: 60 tablet, Rfl: 4   rosuvastatin (CRESTOR) 5 MG tablet, TAKE 1 TABLET(5 MG) BY MOUTH DAILY, Disp: 90 tablet, Rfl: 3   sertraline (ZOLOFT) 100 MG tablet, TAKE 2 TABLETS BY MOUTH DAILY, Disp: 180 tablet, Rfl: 3   torsemide (DEMADEX) 20 MG tablet, Take 1 tablet (20 mg total) by mouth daily as needed (fluid or swelling)., Disp: 30 tablet, Rfl: 2   Allergies: No Known Allergies  REVIEW OF SYSTEMS:   Review of Systems - Oncology   VITALS:   There were no vitals taken for this visit.  Wt Readings from Last 3 Encounters:  05/09/22 104.9 kg (231 lb 3.2 oz)  04/23/22 105.2 kg (232 lb)  03/05/22 102.5 kg (226 lb)    There is no height or weight on file to calculate BMI.   PHYSICAL EXAM:   Physical Exam  LABS:      Latest Ref Rng & Units 04/23/2022   12:14 PM 03/05/2022    4:34 PM 03/20/2020    6:15 PM  CBC  WBC 3.4 - 10.8 x10E3/uL 11.5  13.6  14.7   Hemoglobin 11.1 - 15.9 g/dL 13.5  14.1  14.4   Hematocrit 34.0 - 46.6 % 39.4  39.5  43.4   Platelets 150 - 450 x10E3/uL 188  202  229       Latest Ref Rng & Units 04/23/2022   12:14 PM 03/06/2022   12:39 PM 03/05/2022    4:34 PM  CMP  Glucose 70 - 99 mg/dL 131  154  148   BUN 8 - 27 mg/dL '13  16  11   '$ Creatinine 0.57 - 1.00 mg/dL 0.65  0.84  0.68   Sodium 134 - 144 mmol/L 140  136  140   Potassium 3.5 - 5.2 mmol/L 4.3  3.0  3.4   Chloride 96 - 106 mmol/L  100  96  95   CO2 20 - 29 mmol/L '27  29  30   '$ Calcium 8.7 - 10.3 mg/dL 9.6  8.6  9.2   Total Protein 6.0 - 8.5 g/dL 7.1   7.3   Total Bilirubin 0.0 - 1.2 mg/dL 0.3   0.3   Alkaline Phos 44 - 121 IU/L 142   146    AST 0 - 40 IU/L 20   27   ALT 0 - 32 IU/L 20   19      No results found for: "CEA1", "CEA" / No results found for: "CEA1", "CEA" No results found for: "PSA1" No results found for: "[id number]" No results found for: "CAN125"  No results found for: "TOTALPROTELP", "ALBUMINELP", "A1GS", "A2GS", "BETS", "BETA2SER", "GAMS", "MSPIKE", "SPEI" Lab Results  Component Value Date   FERRITIN 139 08/13/2017   Lab Results  Component Value Date   LDH 235 (H) 08/13/2017     STUDIES:   Colonoscopy 11/17/2018: - Two 10 to 11 mm polyps in the proximal transverse colon and in the proximal ascending colon, removed with a hot snare. Resected and retrieved. - Three 3 to 4 mm polyps in the rectum(2) and in the proximal ascending colon, removed with a cold snare. Resected and retrieved. - External and internal hemorrhoids. - Tortuous LEFT colon.  Pathology 05/23/2018 Diagnosis 1. Colon, polyp(s), ascending - TUBULAR ADENOMA(S). - NO HIGH GRADE DYSPLASIA OR MALIGNANCY. 2. Colon, polyp(s), transverse - TUBULAR ADENOMA(S). - NO HIGH GRADE DYSPLASIA OR MALIGNANCY. 3. Rectum, polyp(s) - COLONIC MUCOSA WITH BENIGN LYMPHOID AGGREGATE. - NO ADENOMATOUS CHANGE OR MALIGNANCY.   No results found.

## 2022-06-10 ENCOUNTER — Other Ambulatory Visit: Payer: Self-pay | Admitting: Family Medicine

## 2022-06-10 DIAGNOSIS — Z1231 Encounter for screening mammogram for malignant neoplasm of breast: Secondary | ICD-10-CM

## 2022-06-25 ENCOUNTER — Telehealth: Payer: Self-pay

## 2022-06-25 NOTE — Telephone Encounter (Signed)
Patient dropped off document DMV, to be filled out by provider. Patient requested to send it via Call Patient to pick up within 7-days. Document is located in providers tray at front office.Please advise at Mobile [phone] (mobile)

## 2022-06-27 NOTE — Telephone Encounter (Signed)
Form was filled out

## 2022-07-23 ENCOUNTER — Ambulatory Visit: Payer: PPO | Admitting: Family Medicine

## 2022-07-30 ENCOUNTER — Ambulatory Visit (INDEPENDENT_AMBULATORY_CARE_PROVIDER_SITE_OTHER): Payer: PPO | Admitting: Family Medicine

## 2022-07-30 VITALS — BP 137/78 | Ht 62.0 in | Wt 235.4 lb

## 2022-07-30 DIAGNOSIS — Z79891 Long term (current) use of opiate analgesic: Secondary | ICD-10-CM | POA: Diagnosis not present

## 2022-07-30 DIAGNOSIS — M546 Pain in thoracic spine: Secondary | ICD-10-CM | POA: Diagnosis not present

## 2022-07-30 DIAGNOSIS — R748 Abnormal levels of other serum enzymes: Secondary | ICD-10-CM | POA: Diagnosis not present

## 2022-07-30 DIAGNOSIS — G609 Hereditary and idiopathic neuropathy, unspecified: Secondary | ICD-10-CM

## 2022-07-30 DIAGNOSIS — D72829 Elevated white blood cell count, unspecified: Secondary | ICD-10-CM | POA: Diagnosis not present

## 2022-07-30 MED ORDER — HYDROCODONE-ACETAMINOPHEN 10-325 MG PO TABS: ORAL_TABLET | ORAL | 0 refills | Status: DC

## 2022-07-30 MED ORDER — PREGABALIN 25 MG PO CAPS: 25.0000 mg | ORAL_CAPSULE | Freq: Two times a day (BID) | ORAL | 3 refills | Status: DC

## 2022-07-30 NOTE — Progress Notes (Signed)
Subjective:    Patient ID: Deborah Carroll, female    DOB: 02-Jun-1955, 67 y.o.   MRN: [id number]  Hyperlipidemia This is a chronic problem. The current episode started more than 1 year ago. Treatments tried: crestor. There are no compliance problems.  Risk factors for coronary artery disease include diabetes mellitus, dyslipidemia and post-menopausal.   Still having issues with back pain She describes increasing pain in the thoracic back.  Hurts with movement hurts at rest sometimes hurts at night pain radiates into both sides.  Pain is becoming to the point where the medicine is not doing enough.  She also has painful neuropathy in both legs has seen neurology before they basically told her there was not much they could do.  Previously she has tried Cymbalta without success.  Pain medication has some success but not much.  Pain is becoming unbearable at this point.  Review of Systems     Objective:   Physical Exam General-in no acute distress Eyes-no discharge Lungs-respiratory rate normal, CTA CV-no murmurs,RRR Extremities skin warm dry no edema Neuro grossly normal Behavior normal, alert Subjective neuropathy both legs up to the knees       Assessment & Plan:  1. Encounter for long-term opiate analgesic use The patient was seen in followup for chronic pain. A review over at their current pain status was discussed. Drug registry was checked. Prescriptions were given.  Regular follow-up recommended. Discussion was held regarding the importance of compliance with medication as well as pain medication contract.  Patient was informed that medication may cause drowsiness and should not be combined  with other medications/alcohol or street drugs. If the patient feels medication is causing altered alertness then do not drive or operate dangerous equipment.  Should be noted that the patient appears to be meeting appropriate use of opioids and response.  Evidenced by improved function  and decent pain control without significant side effects and no evidence of overt aberrancy issues.  Upon discussion with the patient today they understand that opioid therapy is optional and they feel that the pain has been refractory to reasonable conservative measures and is significant and affecting quality of life enough to warrant ongoing therapy and wishes to continue opioids.  Refills were provided. We will go ahead and increase the pain medication to 4 tablets/day we will send in 2 prescriptions she will follow-up within 2 months we will reassess how things are going  - HYDROcodone-acetaminophen (NORCO) 10-325 MG tablet; Take 1 tab four times daily as needed for pain  Dispense: 120 tablet; Refill: 0  2. Leukocytosis, unspecified type We will go ahead with hematology oncology referral and have her check a CBC She has had leukocytosis She was previously referred to oncology at the end of January but for some reason that appointment never materialized she is uncertain if she canceled it or oncology canceled it nonetheless she needs workup of this leukocytosis - Ambulatory referral to Hematology / Oncology - CBC with Differential  3. Idiopathic peripheral neuropathy Given the neuropathy we will check light chains to make sure she is not developing some sort of multiple myeloma or other issues  We are also try pregabalin-she will need to give [place] feedback within the next 2 to 3 weeks how the Lyrica is doing then we will adjust accordingly She will also follow-up in 2 months - Hepatic Function Panel - Gamma GT - C-reactive protein - Kappa/lambda light chains  4. Thoracic spine pain We are doing lab work  to evaluate the leukocytosis as well as elevated alkaline phosphatase in addition to this doing light chains to rule out the possibility of multiple myeloma.  She has had a previous thoracic x-ray-back in November the x-ray was negative and she was encouraged at that time to reach back out to  [place] if not resolved within the next couple weeks  She has tried duloxetine in the past for her discomfort in her legs we are now trying Lyrica Given that her thoracic pain is worsening.  Given that it has been going on since November.  Also given that it is now causing pain at nighttime and has an elevated white blood count she needs an MRI.  We will move forward with getting this.  Also doing additional testing. - MR Thoracic Spine Wo Contrast; Future  5. Elevated alkaline phosphatase level Checking GGT  Awaiting results

## 2022-07-31 ENCOUNTER — Encounter: Payer: Self-pay | Admitting: Family Medicine

## 2022-07-31 DIAGNOSIS — G4733 Obstructive sleep apnea (adult) (pediatric): Secondary | ICD-10-CM | POA: Diagnosis not present

## 2022-07-31 LAB — HEPATIC FUNCTION PANEL
ALT: 36 IU/L — ABNORMAL HIGH (ref 0–32)
AST: 43 IU/L — ABNORMAL HIGH (ref 0–40)
Albumin: 4.3 g/dL (ref 3.9–4.9)
Alkaline Phosphatase: 127 IU/L — ABNORMAL HIGH (ref 44–121)
Bilirubin Total: <0.2 mg/dL (ref 0.0–1.2)
Bilirubin, Direct: <0.1 mg/dL (ref 0.00–0.40)
Total Protein: 7.2 g/dL (ref 6.0–8.5)

## 2022-07-31 LAB — CBC WITH DIFFERENTIAL/PLATELET
Basophils Absolute: 0 10*3/uL (ref 0.0–0.2)
Basos: 0 %
EOS (ABSOLUTE): 0.3 10*3/uL (ref 0.0–0.4)
Eos: 3 %
Hematocrit: 40 % (ref 34.0–46.6)
Hemoglobin: 13.3 g/dL (ref 11.1–15.9)
Immature Grans (Abs): 0.1 10*3/uL (ref 0.0–0.1)
Immature Granulocytes: 1 %
Lymphocytes Absolute: 3.9 10*3/uL — ABNORMAL HIGH (ref 0.7–3.1)
Lymphs: 34 %
MCH: 29 pg (ref 26.6–33.0)
MCHC: 33.3 g/dL (ref 31.5–35.7)
MCV: 87 fL (ref 79–97)
Monocytes Absolute: 0.7 10*3/uL (ref 0.1–0.9)
Monocytes: 6 %
Neutrophils Absolute: 6.4 10*3/uL (ref 1.4–7.0)
Neutrophils: 56 %
Platelets: 184 10*3/uL (ref 150–450)
RBC: 4.58 x10E6/uL (ref 3.77–5.28)
RDW: 13.6 % (ref 11.7–15.4)
WBC: 11.4 10*3/uL — ABNORMAL HIGH (ref 3.4–10.8)

## 2022-07-31 LAB — C-REACTIVE PROTEIN: CRP: 8 mg/L (ref 0–10)

## 2022-07-31 LAB — KAPPA/LAMBDA LIGHT CHAINS
Ig Kappa Free Light Chain: 28.5 mg/L — ABNORMAL HIGH (ref 3.3–19.4)
Ig Lambda Free Light Chain: 24.8 mg/L (ref 5.7–26.3)
KAPPA/LAMBDA RATIO: 1.15 (ref 0.26–1.65)

## 2022-07-31 LAB — GAMMA GT: GGT: 201 IU/L — ABNORMAL HIGH (ref 0–60)

## 2022-08-01 ENCOUNTER — Telehealth: Payer: Self-pay | Admitting: Family Medicine

## 2022-08-01 DIAGNOSIS — R748 Abnormal levels of other serum enzymes: Secondary | ICD-10-CM

## 2022-08-01 DIAGNOSIS — R768 Other specified abnormal immunological findings in serum: Secondary | ICD-10-CM

## 2022-08-01 DIAGNOSIS — M546 Pain in thoracic spine: Secondary | ICD-10-CM

## 2022-08-01 DIAGNOSIS — D72829 Elevated white blood cell count, unspecified: Secondary | ICD-10-CM

## 2022-08-01 DIAGNOSIS — R9389 Abnormal findings on diagnostic imaging of other specified body structures: Secondary | ICD-10-CM

## 2022-08-01 NOTE — Telephone Encounter (Signed)
Nurses I did the result notes on her lab work She does have some additional orders that need to be completed Please also message Toni Amend I would like to have her MRI changed to urgent if necessary stat in order to get it done within the next 10 days due to ongoing back pain over the past 3 months elevated white blood count and elevated light chain need to rule out multiple myeloma or other potential oncology causes of her issue  Thanks-Dr. Lorin Picket

## 2022-08-02 ENCOUNTER — Ambulatory Visit (HOSPITAL_COMMUNITY)
Admission: RE | Admit: 2022-08-02 | Discharge: 2022-08-02 | Disposition: A | Discharge status: Home / Self Care | Payer: PPO | Source: Ambulatory Visit | Attending: Family Medicine | Admitting: Family Medicine

## 2022-08-02 DIAGNOSIS — M546 Pain in thoracic spine: Secondary | ICD-10-CM | POA: Insufficient documentation

## 2022-08-02 DIAGNOSIS — R768 Other specified abnormal immunological findings in serum: Secondary | ICD-10-CM | POA: Diagnosis not present

## 2022-08-02 DIAGNOSIS — M549 Dorsalgia, unspecified: Secondary | ICD-10-CM | POA: Diagnosis not present

## 2022-08-02 NOTE — Telephone Encounter (Signed)
MRI was done today at 2 pm- see results

## 2022-08-02 NOTE — Addendum Note (Signed)
Addended by: Margaretha Sheffield on: 08/02/2022 03:22 PM   Modules accepted: Orders

## 2022-08-03 NOTE — Telephone Encounter (Signed)
Patient is aware of the results of her test, referrals have been made, ultrasound pending, patient already has a follow-up visit and is aware, no further action

## 2022-08-04 ENCOUNTER — Encounter: Payer: Self-pay | Admitting: Family Medicine

## 2022-08-05 NOTE — Addendum Note (Signed)
Addended by: Margaretha Sheffield on: 08/05/2022 04:53 PM   Modules accepted: Orders

## 2022-08-12 ENCOUNTER — Encounter: Payer: Self-pay | Admitting: Family Medicine

## 2022-08-12 DIAGNOSIS — R197 Diarrhea, unspecified: Secondary | ICD-10-CM

## 2022-08-12 DIAGNOSIS — M5124 Other intervertebral disc displacement, thoracic region: Secondary | ICD-10-CM | POA: Diagnosis not present

## 2022-08-12 DIAGNOSIS — R748 Abnormal levels of other serum enzymes: Secondary | ICD-10-CM

## 2022-08-12 NOTE — Telephone Encounter (Signed)
Nurses You may share this message with Maka  Please order liver profile and amylase lipase and metabolic 7  Diagnosis elevated liver enzymes, change in bowel habits  Hi Darcel I certainly understand your concern.  I would advise you to be careful regarding Dr. Waverly Ferrari It would be reasonable to relook at your liver enzymes and bilirubin as well as pancreatic enzymes.  Are you having any symptoms such as severe abdominal pain?  Anything else that you would like to add?  You have a ultrasound coming up later this week for which we are interested in the results.  Based on your symptoms, repeat blood work-which you will be doing this week, and the ultrasound then we will have enough information to know what direction to take this.  Certainly we will communicate with you what we think it may also have to get gastroenterology involved at that point  Thanks-hopefully all of these tests will shed light on this situation Please take care-Dr. Lorin Picket

## 2022-08-15 ENCOUNTER — Ambulatory Visit (HOSPITAL_COMMUNITY)
Admission: RE | Admit: 2022-08-15 | Discharge: 2022-08-15 | Disposition: A | Discharge status: Home / Self Care | Payer: PPO | Source: Ambulatory Visit | Attending: Family Medicine | Admitting: Family Medicine

## 2022-08-15 DIAGNOSIS — R945 Abnormal results of liver function studies: Secondary | ICD-10-CM | POA: Diagnosis not present

## 2022-08-15 DIAGNOSIS — R748 Abnormal levels of other serum enzymes: Secondary | ICD-10-CM | POA: Insufficient documentation

## 2022-08-15 DIAGNOSIS — Z9049 Acquired absence of other specified parts of digestive tract: Secondary | ICD-10-CM | POA: Diagnosis not present

## 2022-08-20 NOTE — Progress Notes (Deleted)
Cardiology Office Note:   Date:  08/20/2022  ID:  [name], DOB Feb 24, 1956, MRN [id number]  History of Present Illness:   Deborah Carroll is a 67 y.o. female anxiety, chronic pain, depression, HTN and morbid obesity who presents to clinic for follow-up.  Per review of the record, patient has history of chest pain. Coronary CTA 05/2020 with minimal obstructive disease. Ca score 49 (70%). TTE 02/2022 with EF 70-75%, G1DD, normal RV, no significant valve disease.  Today, ***  Past Medical History:  Diagnosis Date   Anxiety    Chronic pain    Depression    Diabetes mellitus without complication (HCC)    GERD (gastroesophageal reflux disease)    Hypertension    Morbid obesity (HCC) 03/20/2018   Neuropathy    Peripheral neuropathy    Peripheral neuropathy    Seizures (HCC)    last one 1992- Increased BP with delivery and is on meds still.   Sleep apnea    CPAP x 15 years     ROS: ***  Studies Reviewed:    EKG:  ***  Cardiac Studies & Procedures       ECHOCARDIOGRAM  ECHOCARDIOGRAM COMPLETE 03/06/2022  Narrative ECHOCARDIOGRAM REPORT    Patient Name:   Deborah Carroll [facility] Date of Exam: 03/06/2022 Medical Rec #:  [id number]      Height:       62.0 in Accession #:    [id number]     Weight:       226.0 lb Date of Birth:  [birth year]       BSA:          2.014 m Patient Age:    66 years       BP:           125/73 mmHg Patient Gender: F              HR:           63 bpm. [facility]  Procedure: 2D Echo, Cardiac Doppler and Color Doppler  Indications:    Abnormal ECG R94.31  History:        Patient has no prior history of Echocardiogram examinations. Risk Factors:Hypertension, Diabetes and Dyslipidemia. Obesity.  Sonographer:    Celesta Gentile RCS Referring Phys: [phone] SCOTT A LUKING  IMPRESSIONS   1. Left ventricular ejection fraction, by estimation, is 70 to 75%. The left ventricle has hyperdynamic function. The left ventricle has no regional wall  motion abnormalities. Left ventricular diastolic parameters are consistent with Grade I diastolic dysfunction (impaired relaxation). 2. Right ventricular systolic function is normal. The right ventricular size is mildly enlarged. 3. The mitral valve is grossly normal. No evidence of mitral valve regurgitation. No evidence of mitral stenosis. 4. The aortic valve was not well visualized. Aortic valve regurgitation is not visualized. No aortic stenosis is present. 5. The inferior vena cava is normal in size with greater than 50% respiratory variability, suggesting right atrial pressure of 3 mmHg.  Comparison(s): No prior Echocardiogram.  FINDINGS Left Ventricle: Left ventricular ejection fraction, by estimation, is 70 to 75%. The left ventricle has hyperdynamic function. The left ventricle has no regional wall motion abnormalities. Definity contrast agent was given IV to delineate the left ventricular endocardial borders. The left ventricular internal cavity size was normal in size. There is no left ventricular hypertrophy. Left ventricular diastolic parameters are consistent with Grade I diastolic dysfunction (impaired relaxation).  Right Ventricle: The right ventricular size is mildly  enlarged. Right vetricular wall thickness was not well visualized. Right ventricular systolic function is normal.  Left Atrium: Left atrial size was normal in size.  Right Atrium: Right atrial size was normal in size.  Pericardium: There is no evidence of pericardial effusion.  Mitral Valve: The mitral valve is grossly normal. No evidence of mitral valve regurgitation. No evidence of mitral valve stenosis.  Tricuspid Valve: The tricuspid valve is not well visualized. Tricuspid valve regurgitation is not demonstrated. No evidence of tricuspid stenosis.  Aortic Valve: The aortic valve was not well visualized. Aortic valve regurgitation is not visualized. No aortic stenosis is present.  Pulmonic Valve: The  pulmonic valve was not well visualized. Pulmonic valve regurgitation is trivial. No evidence of pulmonic stenosis.  Aorta: The aortic root is normal in size and structure.  Venous: The inferior vena cava is normal in size with greater than 50% respiratory variability, suggesting right atrial pressure of 3 mmHg.  IAS/Shunts: No atrial level shunt detected by color flow Doppler.   LEFT VENTRICLE PLAX 2D LVIDd:         4.50 cm   Diastology LVIDs:         2.50 cm   LV e' medial:    3.70 cm/s LV PW:         1.10 cm   LV E/e' medial:  19.0 LV IVS:        1.00 cm   LV e' lateral:   4.46 cm/s LVOT diam:     2.00 cm   LV E/e' lateral: 15.8 LV SV:         87 LV SV Index:   43 LVOT Area:     3.14 cm   RIGHT VENTRICLE RV S prime:     18.20 cm/s TAPSE (M-mode): 1.9 cm  LEFT ATRIUM             Index        RIGHT ATRIUM           Index LA diam:        4.10 cm 2.04 cm/m   RA Area:     17.30 cm LA Vol (A2C):   51.8 ml 25.72 ml/m  RA Volume:   42.30 ml  21.01 ml/m LA Vol (A4C):   48.3 ml 23.99 ml/m LA Biplane Vol: 53.6 ml 26.62 ml/m AORTIC VALVE LVOT Vmax:   133.00 cm/s LVOT Vmean:  90.500 cm/s LVOT VTI:    0.276 m  AORTA Ao Root diam: 3.50 cm  MITRAL VALVE MV Area (PHT): 2.62 cm     SHUNTS MV Decel Time: 289 msec     Systemic VTI:  0.28 m MV E velocity: 70.30 cm/s   Systemic Diam: 2.00 cm MV A velocity: 118.00 cm/s MV E/A ratio:  0.60  [name] Electronically signed by Winfield Rast Mallipeddi Signature Date/Time: 03/06/2022/2:33:49 PM    Final     CT SCANS  CT CORONARY MORPH W/CTA COR W/SCORE 05/18/2020  Addendum 05/18/2020  4:04 PM ADDENDUM REPORT: 05/18/2020 16:02  EXAM: OVER-READ INTERPRETATION  CT CHEST  The following report is an over-read performed by radiologist Dr. Deberah Pelton [facility] on 05/18/2020. This over-read does not include interpretation of cardiac or coronary anatomy or pathology. The coronary calcium  score/coronary CTA interpretation by the cardiologist is attached.  COMPARISON:  None.  FINDINGS: The visualized portions of the lower lung fields show no suspicious nodules, masses, or infiltrates. No pleural fluid seen.  The visualized portions of  the mediastinum and chest wall are unremarkable.  IMPRESSION: No significant non-cardiovascular abnormality seen in visualized portion of the thorax.   Electronically Signed By: Danae Orleans M.D. On: 05/18/2020 16:02  Narrative HISTORY: Chest pain, nonspecific  EXAM: Cardiac/Coronary CT  TECHNIQUE: The patient was scanned on a [employer].  PROTOCOL: A 120 kV prospective scan was triggered in the descending thoracic aorta at 111 HU's. Axial non-contrast 3 mm slices were carried out through the heart. The data set was analyzed on a dedicated work station and scored using the Agatson method. Gantry rotation speed was 250 msecs and collimation was 0.6 mm. Beta blockade and 0.8 mg of sl NTG was given. The 3D data set was reconstructed in 5% intervals of 35-75% of the R-R cycle. Diastolic phases were analyzed on a dedicated work station using MPR, MIP and VRT modes. The patient received 80mL OMNIPAQUE IOHEXOL 350 MG/ML SOLN of contrast.  FINDINGS: Coronary calcium score: The patient's coronary artery calcium score is 49, which places the patient in the 70 percentile.  Coronary arteries: Normal coronary origins.  Right dominance.  Right Coronary Artery: Normal caliber vessel, gives rise to PDA. No significant plaque or stenosis.  Left Main Coronary Artery: Normal caliber vessel. No significant plaque or stenosis.  Left Anterior Descending Coronary Artery: Normal caliber vessel. Calcified plaque in proximal LAD with 1-24% stenosis. Gives rise to 1 significant diagonal branch. Distal LAD wraps apex.  Left Circumflex Artery: Normal caliber vessel. No significant plaque or stenosis. Gives rise to large OM1 that  is nearly a ramus branch. There is calcified plaque in proximal OM1 with 1-24% stenosis.  Aorta: Normal size, 33 mm at the mid ascending aorta (level of the PA bifurcation) measured double oblique. No calcifications. No dissection.  Aortic Valve: No calcifications. Trileaflet.  Other findings:  Normal pulmonary vein drainage into the left atrium.  Normal left atrial appendage without a thrombus.  Normal size of the pulmonary artery.  IMPRESSION: 1.  Minimal nonobstructive CAD, CADRADS = 1.  2. Coronary calcium score of 49. This was 70th percentile for age and sex matched control.  3. Normal coronary origin with right dominance.  Electronically Signed: By: Jodelle Red M.D. On: 05/18/2020 14:41           Risk Assessment/Calculations:   {Does this patient have ATRIAL FIBRILLATION?:[id number]} No BP recorded.  {Refresh Note OR Click here to enter BP  :1}***        Physical Exam:   VS:  There were no vitals taken for this visit.   Wt Readings from Last 3 Encounters:  07/30/22 235 lb 6.4 oz (106.8 kg)  05/09/22 231 lb 3.2 oz (104.9 kg)  04/23/22 232 lb (105.2 kg)     GEN: Well nourished, well developed in no acute distress NECK: No JVD; No carotid bruits CARDIAC: ***RRR, no murmurs, rubs, gallops RESPIRATORY:  Clear to auscultation without rales, wheezing or rhonchi  ABDOMEN: Soft, non-tender, non-distended EXTREMITIES:  No edema; No deformity   ASSESSMENT AND PLAN:   #Minimal CAD: -Coronary CTA with minimal disease  -Continue crestor 5mg  daily  #SOB: -Reassuring CV work-up -Coronary CTA with no significant artery disease -TTE with LVEF 70-75%, G1DD, no significant valve disease  #HLD: -Continue crestor 5mg  daily  #Morbid Obesity: -Continue lifestyle modifications    {Are you ordering a CV Procedure (e.g. stress test, cath, DCCV, TEE, etc)?   Press F2        :[id number]}   Signed, Meriam Sprague, MD

## 2022-08-22 ENCOUNTER — Ambulatory Visit: Payer: PPO | Admitting: Cardiology

## 2022-09-05 DIAGNOSIS — M5414 Radiculopathy, thoracic region: Secondary | ICD-10-CM | POA: Diagnosis not present

## 2022-09-05 DIAGNOSIS — M5114 Intervertebral disc disorders with radiculopathy, thoracic region: Secondary | ICD-10-CM | POA: Diagnosis not present

## 2022-09-24 ENCOUNTER — Encounter: Payer: Self-pay | Admitting: *Deleted

## 2022-09-24 DIAGNOSIS — E119 Type 2 diabetes mellitus without complications: Secondary | ICD-10-CM | POA: Insufficient documentation

## 2022-09-26 ENCOUNTER — Ambulatory Visit: Payer: PPO | Admitting: Family Medicine

## 2022-10-03 ENCOUNTER — Ambulatory Visit (HOSPITAL_COMMUNITY): Payer: PPO | Admitting: Physical Therapy

## 2022-10-06 ENCOUNTER — Other Ambulatory Visit: Payer: Self-pay | Admitting: Family Medicine

## 2022-10-07 MED ORDER — HYDROCODONE-ACETAMINOPHEN 10-325 MG PO TABS: ORAL_TABLET | ORAL | 0 refills | Status: DC

## 2022-10-20 ENCOUNTER — Other Ambulatory Visit: Payer: Self-pay | Admitting: Family Medicine

## 2022-10-25 ENCOUNTER — Other Ambulatory Visit: Payer: Self-pay

## 2022-10-25 ENCOUNTER — Encounter (HOSPITAL_COMMUNITY): Payer: Self-pay | Admitting: Physical Therapy

## 2022-10-25 ENCOUNTER — Ambulatory Visit (HOSPITAL_COMMUNITY): Payer: PPO | Attending: Neurological Surgery | Admitting: Physical Therapy

## 2022-10-25 DIAGNOSIS — R2689 Other abnormalities of gait and mobility: Secondary | ICD-10-CM | POA: Diagnosis not present

## 2022-10-25 DIAGNOSIS — M6281 Muscle weakness (generalized): Secondary | ICD-10-CM | POA: Insufficient documentation

## 2022-10-25 DIAGNOSIS — M546 Pain in thoracic spine: Secondary | ICD-10-CM | POA: Diagnosis not present

## 2022-10-25 DIAGNOSIS — R29898 Other symptoms and signs involving the musculoskeletal system: Secondary | ICD-10-CM | POA: Diagnosis not present

## 2022-10-25 NOTE — Therapy (Signed)
OUTPATIENT PHYSICAL THERAPY THORACOLUMBAR EVALUATION   Patient Name: SHASHANA FULLINGTON MRN: [id number] DOB:[birth year], 67 y.o., female Today's Date: 10/25/2022  END OF SESSION:  PT End of Session - 10/25/22 0948     Visit Number 1    Number of Visits 12    Date for PT Re-Evaluation 12/06/22    Authorization Type [employer]    PT Start Time 0948    PT Stop Time 1028    PT Time Calculation (min) 40 min    Activity Tolerance Patient tolerated treatment well    Behavior During Therapy [facility] for tasks assessed/performed             Past Medical History:  Diagnosis Date   Anxiety    Chronic pain    Depression    Diabetes mellitus without complication (HCC)    GERD (gastroesophageal reflux disease)    Hypertension    Morbid obesity (HCC) 03/20/2018   Neuropathy    Peripheral neuropathy    Peripheral neuropathy    Seizures (HCC)    last one 1992- Increased BP with delivery and is on meds still.   Sleep apnea    CPAP x 15 years   Past Surgical History:  Procedure Laterality Date   ABDOMINAL HYSTERECTOMY     ovaries present, no cancer,approx 2001   BACK SURGERY     BIOPSY  05/23/2020   Procedure: BIOPSY;  Surgeon: Lanelle Bal, DO;  Location: [facility];  Service: Endoscopy;;   CARPAL TUNNEL RELEASE     Bilateral   CATARACT EXTRACTION W/PHACO Right 06/20/2015   Procedure: CATARACT EXTRACTION PHACO AND INTRAOCULAR LENS PLACEMENT (IOC);  Surgeon: Jethro Bolus, MD;  Location: [facility];  Service: Ophthalmology;  Laterality: Right;  CDE:6.25   CATARACT EXTRACTION W/PHACO Left 08/29/2015   Procedure: CATARACT EXTRACTION PHACO AND INTRAOCULAR LENS PLACEMENT (IOC);  Surgeon: Jethro Bolus, MD;  Location: [facility];  Service: Ophthalmology;  Laterality: Left;  CDE: 4.11   CESAREAN SECTION     x2   CHOLECYSTECTOMY     COLONOSCOPY  10/14/2002   AYT:[id number] involving descending colon, splenic flexure and distal transverse colon with most extensive inflammation in the  splenic flexure area.  These changes are typical of ischemic colitis.  Biopsy taken.   COLONOSCOPY N/A 08/04/2012   SLF: POLYPOID LESION IN AT THE APPENDICEAL ORIFICE/Polyp  in the descending colon/Moderate melanosis throughout the entire examined colon/ The colon IS redundant. small tubular adenoma.    COLONOSCOPY WITH PROPOFOL N/A 11/17/2018   3 tubular adenomas removed, 3 hyperplastic polyps, next colonoscopy in 11/2021.    ESOPHAGOGASTRODUODENOSCOPY (EGD) WITH PROPOFOL N/A 05/23/2020   Procedure: ESOPHAGOGASTRODUODENOSCOPY (EGD) WITH PROPOFOL;  Surgeon: Lanelle Bal, DO;  [facility]: [facility];  Service: Endoscopy;  Laterality: N/A;  11:45am   FOOT SURGERY Bilateral 2004   hammer toe repairs   PARS PLANA VITRECTOMY W/ REPAIR OF MACULAR HOLE     POLYPECTOMY  11/17/2018   Procedure: POLYPECTOMY;  Surgeon: West Bali, MD;  Location: [facility];  Service: Endoscopy;;   POLYPECTOMY  05/23/2020   Procedure: POLYPECTOMY;  Surgeon: Lanelle Bal, DO;  Location: [facility];  Service: Endoscopy;;   Patient Active Problem List   Diagnosis Date Noted   Diabetes mellitus without complication (HCC)    Bilateral impacted cerumen 11/15/2021   Sensorineural hearing loss (SNHL), bilateral 11/15/2021   Vaginal atrophy 07/10/2021   S/P hysterectomy 07/10/2021   Tenderness of female pelvic organs 07/10/2021   Pelvic  pressure in female 07/10/2021   Vaginal pain 07/10/2021   Vaginal inclusion cyst 07/10/2021   Hyperlipidemia associated with type 2 diabetes mellitus (HCC) 08/07/2020   Wellness examination 07/04/2020   Chest pain 03/20/2020   Early satiety 03/06/2020   Elevated alkaline phosphatase level 03/06/2020   Nausea without vomiting 03/06/2020   Loss of weight 01/03/2020   Appetite loss 01/03/2020   Morbid obesity (HCC) 03/20/2018   Heme positive stool 02/11/2018   Irritable bowel syndrome with both constipation and diarrhea 07/28/2017   Gastroesophageal reflux disease without  esophagitis 07/28/2017   Major depression 08/30/2015   Prediabetes 05/31/2015   Encounter for long-term opiate analgesic use 12/29/2014   Chronic back pain 08/19/2013   Obstructive sleep apnea 08/19/2013   Seizures (HCC) 02/22/2013   Peripheral edema 11/11/2012   Peripheral neuropathy 11/11/2012   Unspecified constipation 07/27/2012   Bowel habit changes 07/27/2012   ISCHEMIC COLITIS, HX OF 04/12/2009    PCP: Lilyan Punt MD  REFERRING PROVIDER: Barnett Abu, MD  REFERRING DIAG: M51.24 (ICD-10-CM) - Other intervertebral disc displacement, thoracic region  Rationale for Evaluation and Treatment: Rehabilitation  THERAPY DIAG:  Pain in thoracic spine  Other abnormalities of gait and mobility  Muscle weakness (generalized)  Other symptoms and signs involving the musculoskeletal system  ONSET DATE: 1 year  SUBJECTIVE:                                                                                                                                                                                           SUBJECTIVE STATEMENT: Patient states pain in mid back that wraps around ribs. Symptoms began with gradual progression about a  year ago. Symptoms increase with standing, walking. Has been canning a lot and that has been aggravating it. By the evening she is paying for it. Has been trying heat and ice with short term relief.   PERTINENT HISTORY:  Chronic back pain, anxiety, depression, DM, HTN, neuropathy, Discectomy at L4-L5 in 2007  PAIN:  Are you having pain? Yes: NPRS scale: 7/10 Pain location: thoracic spine and L ribs Pain description: deep ache Aggravating factors: standing, walking Relieving factors: heat/ ice for short term relief  PRECAUTIONS: None  WEIGHT BEARING RESTRICTIONS: No  FALLS:  Has patient fallen in last 6 months? No  OCCUPATION: Retired  PLOF: Independent  PATIENT GOALS: get some relief in her back  OBJECTIVE:  Observation: symptoms at  lower/mid curve of thoracic spine   DIAGNOSTIC FINDINGS:  MRI 08/02/22 IMPRESSION: 1. Moderate disc bulge at C6-7 results in mild flattening of the ventral cord, worse from prior. 2. New central disc protrusion at T7-8 results  in deformity of the ventral cord. 3. No neural foraminal stenosis in the thoracic spine.  PATIENT SURVEYS:  FOTO not completed due to IT issues  SCREENING FOR RED FLAGS: Bowel or bladder incontinence: No Spinal tumors: No Cauda equina syndrome: No Compression fracture: No Abdominal aneurysm: No  COGNITION: Overall cognitive status: Within functional limits for tasks assessed     SENSATION: WFL  POSTURE: rounded shoulders, forward head, and increased thoracic kyphosis  PALPATION: TTP bilateral thoracic paraspinals around T7-T9  ThoracoLUMBAR ROM:   AROM eval  Flexion 25% limited *  Extension 25% limited *  Right lateral flexion 25% limited *  Left lateral flexion 25% limited *  Right rotation 25% limited *  Left rotation 25% limited *   (Blank rows = not tested) * = symptoms (pain with returning to neutral)  LOWER EXTREMITY ROM:   WFL for tasks assessed  UPPER EXTREMITY MMT:  5/5 shoulder flexion, abduction, elbow flexion,extension  LOWER EXTREMITY MMT:    MMT Right eval Left eval  Hip flexion 5 5  Hip extension 4 4  Hip abduction 4 4  Hip adduction    Hip internal rotation    Hip external rotation    Knee flexion 5 5  Knee extension 5 5  Ankle dorsiflexion 5 5  Ankle plantarflexion    Ankle inversion    Ankle eversion     (Blank rows = not tested)    FUNCTIONAL TESTS:  5 times sit to stand: 25.99 seconds without UE use, pain throughout  2 minute walk test: 350 feet  GAIT: Distance walked: 350 Assistive device utilized: None Level of assistance: Complete Independence Comments: ; symptom increase after 1 minute, tense throughout upper body   TODAY'S TREATMENT:                                                                                                                               DATE:  10/25/22 Hooklying glute set 10 x 5 second holds Mini bridge 1 x 10 LTR 10 x 5 second holds    PATIENT EDUCATION:  Education details: Patient educated on exam findings, POC, scope of PT, HEP, and posture. Person educated: Patient Education method: Explanation, Demonstration, and Handouts Education comprehension: verbalized understanding, returned demonstration, verbal cues required, and tactile cues required  HOME EXERCISE PROGRAM: Access Code: [email or web address] URL: [email or web address] Date: 10/25/2022 Prepared by: Greig Castilla Corah Willeford  Exercises - Hooklying Gluteal Sets  - 2 x daily - 7 x weekly - 1-2 sets - 10 reps - 5 second holds hold - Bridge  - 2 x daily - 7 x weekly - 1-2 sets - 10 reps - Supine Lower Trunk Rotation  - 2 x daily - 7 x weekly - 1-2 sets - 10 reps - 5 second hold  ASSESSMENT:  CLINICAL IMPRESSION: Patient a 67 y.o. y.o. female who was seen today for physical therapy evaluation and treatment for thoracic back pain. Patient presents with pain limited  deficits in thoracic spine strength, ROM, endurance, activity tolerance, posture, and functional mobility with ADL. Patient is having to modify and restrict ADL as indicated by outcome measure score as well as subjective information and objective measures which is affecting overall participation. Patient will benefit from skilled physical therapy in order to improve function and reduce impairment.  OBJECTIVE IMPAIRMENTS: decreased activity tolerance, decreased endurance, decreased mobility, difficulty walking, decreased ROM, decreased strength, increased muscle spasms, impaired flexibility, improper body mechanics, postural dysfunction, and pain.   ACTIVITY LIMITATIONS: carrying, lifting, bending, standing, squatting, stairs, transfers, reach over head, locomotion level, and caring for others  PARTICIPATION LIMITATIONS: meal prep, cleaning,  laundry, shopping, community activity, and yard work  PERSONAL FACTORS: Fitness, Time since onset of injury/illness/exacerbation, and 3+ comorbidities: Chronic back pain, anxiety, depression, DM, HTN, neuropathy  are also affecting patient's functional outcome.   REHAB POTENTIAL: Good  CLINICAL DECISION MAKING: Stable/uncomplicated  EVALUATION COMPLEXITY: Low   GOALS: Goals reviewed with patient? Yes  SHORT TERM GOALS: Target date: 11/15/2022    Patient will be independent with HEP in order to improve functional outcomes. Baseline:  Goal status: INITIAL  2.  Patient will report at least 25% improvement in symptoms for improved quality of life. Baseline:  Goal status: INITIAL    LONG TERM GOALS: Target date: 12/06/2022    Patient will report at least 75% improvement in symptoms for improved quality of life. Baseline:  Goal status: INITIAL  2.  Patient will improve FOTO score to at least predicted outcome in order to indicate improved tolerance to activity. Baseline:  Goal status: INITIAL  3.  Patient will demonstrate at least 25% improvement in thoracolumbar ROM in all restricted planes for improved ability to move trunk while completing chores. Baseline: see above Goal status: INITIAL  4.  Patient will be able to ambulate at least 425 feet feet in in order to demonstrate improved tolerance to activity. Baseline: 350 feet Goal status: INITIAL  5.  Patient will demonstrate grade of 5/5 MMT grade in all tested musculature as evidence of improved strength to assist with stair ambulation and gait.   Baseline: see above Goal status: INITIAL  6.  Patient will be able to complete 5x STS in under 15 seconds in order to demo improved functional strength. Baseline: 25.99 seconds Goal status: INITIAL   PLAN:  PT FREQUENCY: 2x/week  PT DURATION: 6 weeks  PLANNED INTERVENTIONS: Therapeutic exercises, Therapeutic activity, Neuromuscular re-education, Balance training,  Gait training, Patient/Family education, Joint manipulation, Joint mobilization, Stair training, Orthotic/Fit training, DME instructions, Aquatic Therapy, Dry Needling, Electrical stimulation, Spinal manipulation, Spinal mobilization, Cryotherapy, Moist heat, Compression bandaging, scar mobilization, Splintting, Taping, Traction, Ultrasound, Ionotophoresis 4mg /ml Dexamethasone, and Manual therapy  PLAN FOR NEXT SESSION: FOTO, f/u with HEP, spinal mobility: postural, core, hip strength   Reola Mosher Kaysha Parsell, PT 10/25/2022, 10:29 AM

## 2022-10-29 ENCOUNTER — Encounter (HOSPITAL_COMMUNITY): Payer: Self-pay | Admitting: Physical Therapy

## 2022-10-29 ENCOUNTER — Ambulatory Visit (HOSPITAL_COMMUNITY): Payer: PPO | Admitting: Physical Therapy

## 2022-10-29 DIAGNOSIS — R2689 Other abnormalities of gait and mobility: Secondary | ICD-10-CM

## 2022-10-29 DIAGNOSIS — R29898 Other symptoms and signs involving the musculoskeletal system: Secondary | ICD-10-CM

## 2022-10-29 DIAGNOSIS — M546 Pain in thoracic spine: Secondary | ICD-10-CM

## 2022-10-29 DIAGNOSIS — M6281 Muscle weakness (generalized): Secondary | ICD-10-CM

## 2022-10-29 NOTE — Therapy (Signed)
OUTPATIENT PHYSICAL THERAPY TREATMENT   Patient Name: Deborah Carroll MRN: [id number] DOB:[birth year], 67 y.o., female Today's Date: 10/29/2022  END OF SESSION:  PT End of Session - 10/29/22 1347     Visit Number 2    Number of Visits 12    Date for PT Re-Evaluation 12/06/22    Authorization Type [employer]    PT Start Time 1347    PT Stop Time 1430    PT Time Calculation (min) 43 min    Activity Tolerance Patient tolerated treatment well    Behavior During Therapy WFL for tasks assessed/performed             Past Medical History:  Diagnosis Date   Anxiety    Chronic pain    Depression    Diabetes mellitus without complication (HCC)    GERD (gastroesophageal reflux disease)    Hypertension    Morbid obesity (HCC) 03/20/2018   Neuropathy    Peripheral neuropathy    Peripheral neuropathy    Seizures (HCC)    last one 1992- Increased BP with delivery and is on meds still.   Sleep apnea    CPAP x 15 years   Past Surgical History:  Procedure Laterality Date   ABDOMINAL HYSTERECTOMY     ovaries present, no cancer,approx 2001   BACK SURGERY     BIOPSY  05/23/2020   Procedure: BIOPSY;  Surgeon: Lanelle Bal, DO;  Location: [facility];  Service: Endoscopy;;   CARPAL TUNNEL RELEASE     Bilateral   CATARACT EXTRACTION W/PHACO Right 06/20/2015   Procedure: CATARACT EXTRACTION PHACO AND INTRAOCULAR LENS PLACEMENT (IOC);  Surgeon: Jethro Bolus, MD;  Location: [facility];  Service: Ophthalmology;  Laterality: Right;  CDE:6.25   CATARACT EXTRACTION W/PHACO Left 08/29/2015   Procedure: CATARACT EXTRACTION PHACO AND INTRAOCULAR LENS PLACEMENT (IOC);  Surgeon: Jethro Bolus, MD;  Location: [facility];  Service: Ophthalmology;  Laterality: Left;  CDE: 4.11   CESAREAN SECTION     x2   CHOLECYSTECTOMY     COLONOSCOPY  10/14/2002   WUJ:[id number] involving descending colon, splenic flexure and distal transverse colon with most extensive inflammation in the splenic flexure  area.  These changes are typical of ischemic colitis.  Biopsy taken.   COLONOSCOPY N/A 08/04/2012   SLF: POLYPOID LESION IN AT THE APPENDICEAL ORIFICE/Polyp  in the descending colon/Moderate melanosis throughout the entire examined colon/ The colon IS redundant. small tubular adenoma.    COLONOSCOPY WITH PROPOFOL N/A 11/17/2018   3 tubular adenomas removed, 3 hyperplastic polyps, next colonoscopy in 11/2021.    ESOPHAGOGASTRODUODENOSCOPY (EGD) WITH PROPOFOL N/A 05/23/2020   Procedure: ESOPHAGOGASTRODUODENOSCOPY (EGD) WITH PROPOFOL;  Surgeon: Lanelle Bal, DO;  Location: [facility];  Service: Endoscopy;  Laterality: N/A;  11:45am   FOOT SURGERY Bilateral 2004   hammer toe repairs   PARS PLANA VITRECTOMY W/ REPAIR OF MACULAR HOLE     POLYPECTOMY  11/17/2018   Procedure: POLYPECTOMY;  Surgeon: West Bali, MD;  Location: [facility];  Service: Endoscopy;;   POLYPECTOMY  05/23/2020   Procedure: POLYPECTOMY;  Surgeon: Lanelle Bal, DO;  Location: [facility];  Service: Endoscopy;;   Patient Active Problem List   Diagnosis Date Noted   Diabetes mellitus without complication (HCC)    Bilateral impacted cerumen 11/15/2021   Sensorineural hearing loss (SNHL), bilateral 11/15/2021   Vaginal atrophy 07/10/2021   S/P hysterectomy 07/10/2021   Tenderness of female pelvic organs 07/10/2021   Pelvic pressure  in female 07/10/2021   Vaginal pain 07/10/2021   Vaginal inclusion cyst 07/10/2021   Hyperlipidemia associated with type 2 diabetes mellitus (HCC) 08/07/2020   Wellness examination 07/04/2020   Chest pain 03/20/2020   Early satiety 03/06/2020   Elevated alkaline phosphatase level 03/06/2020   Nausea without vomiting 03/06/2020   Loss of weight 01/03/2020   Appetite loss 01/03/2020   Morbid obesity (HCC) 03/20/2018   Heme positive stool 02/11/2018   Irritable bowel syndrome with both constipation and diarrhea 07/28/2017   Gastroesophageal reflux disease without esophagitis  07/28/2017   Major depression 08/30/2015   Prediabetes 05/31/2015   Encounter for long-term opiate analgesic use 12/29/2014   Chronic back pain 08/19/2013   Obstructive sleep apnea 08/19/2013   Seizures (HCC) 02/22/2013   Peripheral edema 11/11/2012   Peripheral neuropathy 11/11/2012   Unspecified constipation 07/27/2012   Bowel habit changes 07/27/2012   ISCHEMIC COLITIS, HX OF 04/12/2009    PCP: Lilyan Punt MD  REFERRING PROVIDER: Barnett Abu, MD  REFERRING DIAG: M51.24 (ICD-10-CM) - Other intervertebral disc displacement, thoracic region  Rationale for Evaluation and Treatment: Rehabilitation  THERAPY DIAG:  Pain in thoracic spine  Other abnormalities of gait and mobility  Muscle weakness (generalized)  Other symptoms and signs involving the musculoskeletal system  ONSET DATE: 1 year  SUBJECTIVE:                                                                                                                                                                                           SUBJECTIVE STATEMENT: Patient states back is really bothering her and all the way around into ribs and really bothering her the last few days. Been trying to use some ice packs but doesn't help like it used to.    EVAL: Patient states pain in mid back that wraps around ribs. Symptoms began with gradual progression about a  year ago. Symptoms increase with standing, walking. Has been canning a lot and that has been aggravating it. By the evening she is paying for it. Has been trying heat and ice with short term relief.   PERTINENT HISTORY:  Chronic back pain, anxiety, depression, DM, HTN, neuropathy, Discectomy at L4-L5 in 2007  PAIN:  Are you having pain? Yes: NPRS scale: 7-8/10 Pain location: thoracic spine and L ribs Pain description: deep ache Aggravating factors: standing, walking Relieving factors: heat/ ice for short term relief  PRECAUTIONS: None  WEIGHT BEARING RESTRICTIONS:  No  FALLS:  Has patient fallen in last 6 months? No  OCCUPATION: Retired  PLOF: Independent  PATIENT GOALS: get some relief in her back  OBJECTIVE:  Observation: symptoms  at lower/mid curve of thoracic spine   DIAGNOSTIC FINDINGS:  MRI 08/02/22 IMPRESSION: 1. Moderate disc bulge at C6-7 results in mild flattening of the ventral cord, worse from prior. 2. New central disc protrusion at T7-8 results in deformity of the ventral cord. 3. No neural foraminal stenosis in the thoracic spine.  PATIENT SURVEYS:  FOTO not completed due to IT issues  SCREENING FOR RED FLAGS: Bowel or bladder incontinence: No Spinal tumors: No Cauda equina syndrome: No Compression fracture: No Abdominal aneurysm: No  COGNITION: Overall cognitive status: Within functional limits for tasks assessed     SENSATION: WFL  POSTURE: rounded shoulders, forward head, and increased thoracic kyphosis  PALPATION: TTP bilateral thoracic paraspinals around T7-T9  ThoracoLUMBAR ROM:   AROM eval  Flexion 25% limited *  Extension 25% limited *  Right lateral flexion 25% limited *  Left lateral flexion 25% limited *  Right rotation 25% limited *  Left rotation 25% limited *   (Blank rows = not tested) * = symptoms (pain with returning to neutral)  LOWER EXTREMITY ROM:   WFL for tasks assessed  UPPER EXTREMITY MMT:  5/5 shoulder flexion, abduction, elbow flexion,extension  LOWER EXTREMITY MMT:    MMT Right eval Left eval  Hip flexion 5 5  Hip extension 4 4  Hip abduction 4 4  Hip adduction    Hip internal rotation    Hip external rotation    Knee flexion 5 5  Knee extension 5 5  Ankle dorsiflexion 5 5  Ankle plantarflexion    Ankle inversion    Ankle eversion     (Blank rows = not tested)    FUNCTIONAL TESTS:  5 times sit to stand: 25.99 seconds without UE use, pain throughout  2 minute walk test: 350 feet  GAIT: Distance walked: 350 Assistive device utilized: None Level of  assistance: Complete Independence Comments: ; symptom increase after 1 minute, tense throughout upper body   TODAY'S TREATMENT:                                                                                                                              DATE:  10/29/22 LTR 10 x 5 second holds  Hooklying glute set 10 x 5 second holds Mini bridge 1 x 10 Prone on elbows 3 minutes Prone press up 1 x 5, 1 x 10  Prone hip extension 1 x 10 bilateral  Seated t/sp extension over chair 2 x 10  Seated t/sp extension over chair with PT overpressure at T7-T9 as fulcrum x 3 Self STM with tennis ball to thoracic paraspinals   10/25/22 Hooklying glute set 10 x 5 second holds Mini bridge 1 x 10 LTR 10 x 5 second holds    PATIENT EDUCATION:  Education details: 10/29/22: HEP;  EVAL: Patient educated on exam findings, POC, scope of PT, HEP, and posture. Person educated: Patient Education method: Explanation, Demonstration, and Handouts Education comprehension: verbalized understanding, returned  demonstration, verbal cues required, and tactile cues required  HOME EXERCISE PROGRAM: Access Code: [email or web address] URL: [email or web address] Date: 10/25/2022 Prepared by: Greig Castilla Tasneem Cormier  Exercises - Hooklying Gluteal Sets  - 2 x daily - 7 x weekly - 1-2 sets - 10 reps - 5 second holds hold - Bridge  - 2 x daily - 7 x weekly - 1-2 sets - 10 reps - Supine Lower Trunk Rotation  - 2 x daily - 7 x weekly - 1-2 sets - 10 reps - 5 second hold  ASSESSMENT:  CLINICAL IMPRESSION: Began session with previously completed exercises to check mechanics as patient with c/o increased symptoms lately. Cueing given to limit AROM due to increase in symptoms with forced end range. Trial of extension based exercises for thoracic mobility and pain relief but no change in symptoms following. Demonstrated and educated on self STM and patient performed at end of session to thoracic paraspinals. Patient with no change  in symptoms at end of session  Patient will continue to benefit from physical therapy in order to improve function and reduce impairment.    OBJECTIVE IMPAIRMENTS: decreased activity tolerance, decreased endurance, decreased mobility, difficulty walking, decreased ROM, decreased strength, increased muscle spasms, impaired flexibility, improper body mechanics, postural dysfunction, and pain.   ACTIVITY LIMITATIONS: carrying, lifting, bending, standing, squatting, stairs, transfers, reach over head, locomotion level, and caring for others  PARTICIPATION LIMITATIONS: meal prep, cleaning, laundry, shopping, community activity, and yard work  PERSONAL FACTORS: Fitness, Time since onset of injury/illness/exacerbation, and 3+ comorbidities: Chronic back pain, anxiety, depression, DM, HTN, neuropathy  are also affecting patient's functional outcome.   REHAB POTENTIAL: Good  CLINICAL DECISION MAKING: Stable/uncomplicated  EVALUATION COMPLEXITY: Low   GOALS: Goals reviewed with patient? Yes  SHORT TERM GOALS: Target date: 11/15/2022    Patient will be independent with HEP in order to improve functional outcomes. Baseline:  Goal status: INITIAL  2.  Patient will report at least 25% improvement in symptoms for improved quality of life. Baseline:  Goal status: INITIAL    LONG TERM GOALS: Target date: 12/06/2022    Patient will report at least 75% improvement in symptoms for improved quality of life. Baseline:  Goal status: INITIAL  2.  Patient will improve FOTO score to at least predicted outcome in order to indicate improved tolerance to activity. Baseline:  Goal status: INITIAL  3.  Patient will demonstrate at least 25% improvement in thoracolumbar ROM in all restricted planes for improved ability to move trunk while completing chores. Baseline: see above Goal status: INITIAL  4.  Patient will be able to ambulate at least 425 feet feet in in order to demonstrate improved  tolerance to activity. Baseline: 350 feet Goal status: INITIAL  5.  Patient will demonstrate grade of 5/5 MMT grade in all tested musculature as evidence of improved strength to assist with stair ambulation and gait.   Baseline: see above Goal status: INITIAL  6.  Patient will be able to complete 5x STS in under 15 seconds in order to demo improved functional strength. Baseline: 25.99 seconds Goal status: INITIAL   PLAN:  PT FREQUENCY: 2x/week  PT DURATION: 6 weeks  PLANNED INTERVENTIONS: Therapeutic exercises, Therapeutic activity, Neuromuscular re-education, Balance training, Gait training, Patient/Family education, Joint manipulation, Joint mobilization, Stair training, Orthotic/Fit training, DME instructions, Aquatic Therapy, Dry Needling, Electrical stimulation, Spinal manipulation, Spinal mobilization, Cryotherapy, Moist heat, Compression bandaging, scar mobilization, Splintting, Taping, Traction, Ultrasound, Ionotophoresis 4mg /ml Dexamethasone, and Manual therapy  PLAN FOR NEXT SESSION: FOTO,  f/u with HEP, spinal mobility: postural, core, hip strength   Reola Mosher Ellard Nan, PT 10/29/2022, 2:46 PM

## 2022-10-30 ENCOUNTER — Encounter: Payer: Self-pay | Admitting: Family Medicine

## 2022-10-30 ENCOUNTER — Ambulatory Visit (INDEPENDENT_AMBULATORY_CARE_PROVIDER_SITE_OTHER): Payer: PPO | Admitting: Family Medicine

## 2022-10-30 VITALS — BP 116/73 | HR 60 | Wt 236.2 lb

## 2022-10-30 DIAGNOSIS — G4733 Obstructive sleep apnea (adult) (pediatric): Secondary | ICD-10-CM | POA: Diagnosis not present

## 2022-10-30 DIAGNOSIS — R748 Abnormal levels of other serum enzymes: Secondary | ICD-10-CM

## 2022-10-30 DIAGNOSIS — E119 Type 2 diabetes mellitus without complications: Secondary | ICD-10-CM

## 2022-10-30 DIAGNOSIS — R768 Other specified abnormal immunological findings in serum: Secondary | ICD-10-CM | POA: Diagnosis not present

## 2022-10-30 DIAGNOSIS — D72829 Elevated white blood cell count, unspecified: Secondary | ICD-10-CM

## 2022-10-30 DIAGNOSIS — E785 Hyperlipidemia, unspecified: Secondary | ICD-10-CM | POA: Diagnosis not present

## 2022-10-30 DIAGNOSIS — E1169 Type 2 diabetes mellitus with other specified complication: Secondary | ICD-10-CM

## 2022-10-30 DIAGNOSIS — Z79891 Long term (current) use of opiate analgesic: Secondary | ICD-10-CM | POA: Diagnosis not present

## 2022-10-30 NOTE — Progress Notes (Signed)
Subjective:    Patient ID: Deborah Carroll, female    DOB: 05/13/1955, 67 y.o.   MRN: [id number]  HPI This patient was seen today for chronic pain  The medication list was reviewed and updated.   Location of Pain for which the patient has been treated with regarding narcotics: Patient had back pain for years previous surgery in his lower back in the early 2000's currently has low back pain discomfort with sciatica also has midthoracic pain and discomfort.  Onset of this pain: Present for years   -Compliance with medication: Good compliance with medicine  - Number patient states they take daily: Takes 4 tablets daily  -Reason for ongoing use of opioids chronic low back pain chronic thoracic pain Tylenol  What other measures have been tried outside of opioids Tylenol, NSAIDs, surgery, physical therapy  In the ongoing specialists regarding this condition spine specialist  -when was the last dose patient took? 0830 this morning  The patient was advised the importance of maintaining medication and not using illegal substances with these.  Here for refills and follow up  The patient was educated that we can provide 3 monthly scripts for their medication, it is their responsibility to follow the instructions.  Side effects or complications from medications: None  Patient is aware that pain medications are meant to minimize the severity of the pain to allow their pain levels to improve to allow for better function. They are aware of that pain medications cannot totally remove their pain.  Due for UDT ( at least once per year) (pain management contract is also completed at the time of the UDT): 04/23/2022  Scale of 1 to 10 ( 1 is least 10 is most) Your pain level without the medicine: 8 Your pain level with medication 5  Scale 1 to 10 ( 1-helps very little, 10 helps very well) How well does your pain medication reduce your pain so you can function better through out the day?   8  Quality of the pain: Throbbing aching  Persistence of the pain: Present all the time  Modifying factors: Worse with activity  Encounter for long-term opiate analgesic use  Elevated liver enzymes - Plan: Hepatic Function Panel  Elevated serum immunoglobulin free light chain level - Plan: Kappa/lambda light chains  Leukocytosis, unspecified type  Diabetes mellitus without complication (HCC) - Plan: Hemoglobin A1c, CBC with Differential, Basic Metabolic Panel, Microalbumin/Creatinine Ratio, Urine  Hyperlipidemia associated with type 2 diabetes mellitus (HCC) - Plan: Lipid panel          Review of Systems     Objective:   Physical Exam General-in no acute distress Eyes-no discharge Lungs-respiratory rate normal, CTA CV-no murmurs,RRR Extremities skin warm dry no edema Neuro grossly normal Behavior normal, alert        Assessment & Plan:  1. Encounter for long-term opiate analgesic use Drug registry checked Prescription sent in See above The patient was seen in followup for chronic pain. A review over at their current pain status was discussed. Drug registry was checked. Prescriptions were given.  Regular follow-up recommended. Discussion was held regarding the importance of compliance with medication as well as pain medication contract.  Patient was informed that medication may cause drowsiness and should not be combined  with other medications/alcohol or street drugs. If the patient feels medication is causing altered alertness then do not drive or operate dangerous equipment.  Should be noted that the patient appears to be meeting appropriate use of opioids  and response.  Evidenced by improved function and decent pain control without significant side effects and no evidence of overt aberrancy issues.  Upon discussion with the patient today they understand that opioid therapy is optional and they feel that the pain has been refractory to reasonable conservative  measures and is significant and affecting quality of life enough to warrant ongoing therapy and wishes to continue opioids.  Refills were provided.  [facility] medical Board guidelines regarding the pain medicine has been reviewed.  CDC guidelines most updated 2022 has been reviewed by the prescriber.  PDMP is checked on a regular basis yearly urine drug screen and pain management contract   2. Elevated liver enzymes Check liver functions.  Fatty liver.  Healthy diet try to lose weight - Hepatic Function Panel  3. Elevated serum immunoglobulin free light chain level Check additional lab work await the results - Kappa/lambda light chains  4. Leukocytosis, unspecified type Given the leukocytosis we will check CBC if persistent may well need evaluation with hematology  5. Diabetes mellitus without complication (HCC) Check A1c await the results healthy diet continue current medications - Hemoglobin A1c - CBC with Differential - Basic Metabolic Panel - Microalbumin/Creatinine Ratio, Urine  6. Hyperlipidemia associated with type 2 diabetes mellitus (HCC) Continue current medication keep LDL below 70 - Lipid panel  Follow-up 3 months

## 2022-10-30 NOTE — Addendum Note (Signed)
Addended by: Lilyan Punt A on: 10/30/2022 08:35 PM   Modules accepted: Orders

## 2022-10-31 ENCOUNTER — Ambulatory Visit (HOSPITAL_COMMUNITY): Payer: PPO | Admitting: Physical Therapy

## 2022-11-04 ENCOUNTER — Ambulatory Visit (HOSPITAL_COMMUNITY): Payer: PPO

## 2022-11-04 ENCOUNTER — Encounter (HOSPITAL_COMMUNITY): Payer: Self-pay

## 2022-11-04 DIAGNOSIS — R29898 Other symptoms and signs involving the musculoskeletal system: Secondary | ICD-10-CM

## 2022-11-04 DIAGNOSIS — M6281 Muscle weakness (generalized): Secondary | ICD-10-CM

## 2022-11-04 DIAGNOSIS — R2689 Other abnormalities of gait and mobility: Secondary | ICD-10-CM

## 2022-11-04 DIAGNOSIS — M546 Pain in thoracic spine: Secondary | ICD-10-CM

## 2022-11-04 NOTE — Therapy (Signed)
OUTPATIENT PHYSICAL THERAPY TREATMENT   Patient Name: Deborah Carroll MRN: [id number] DOB:1955/04/17, 67 y.o., female Today's Date: 11/04/2022  END OF SESSION:  PT End of Session - 11/04/22 1133     Visit Number 3    Number of Visits 12    Date for PT Re-Evaluation 12/06/22    Authorization Type [employer]    PT Start Time 1133    PT Stop Time 1215    PT Time Calculation (min) 42 min    Activity Tolerance Patient tolerated treatment well    Behavior During Therapy WFL for tasks assessed/performed             Past Medical History:  Diagnosis Date   Anxiety    Chronic pain    Depression    Diabetes mellitus without complication (HCC)    GERD (gastroesophageal reflux disease)    Hypertension    Morbid obesity (HCC) 03/20/2018   Neuropathy    Peripheral neuropathy    Peripheral neuropathy    Seizures (HCC)    last one 1992- Increased BP with delivery and is on meds still.   Sleep apnea    CPAP x 15 years   Past Surgical History:  Procedure Laterality Date   ABDOMINAL HYSTERECTOMY     ovaries present, no cancer,approx 2001   BACK SURGERY     BIOPSY  05/23/2020   Procedure: BIOPSY;  Surgeon: Lanelle Bal, DO;  Location: [facility];  Service: Endoscopy;;   CARPAL TUNNEL RELEASE     Bilateral   CATARACT EXTRACTION W/PHACO Right 06/20/2015   Procedure: CATARACT EXTRACTION PHACO AND INTRAOCULAR LENS PLACEMENT (IOC);  Surgeon: Jethro Bolus, MD;  Location: [facility];  Service: Ophthalmology;  Laterality: Right;  CDE:6.25   CATARACT EXTRACTION W/PHACO Left 08/29/2015   Procedure: CATARACT EXTRACTION PHACO AND INTRAOCULAR LENS PLACEMENT (IOC);  Surgeon: Jethro Bolus, MD;  Location: [facility];  Service: Ophthalmology;  Laterality: Left;  CDE: 4.11   CESAREAN SECTION     x2   CHOLECYSTECTOMY     COLONOSCOPY  10/14/2002   WUJ:[id number] involving descending colon, splenic flexure and distal transverse colon with most extensive inflammation in the splenic flexure  area.  These changes are typical of ischemic colitis.  Biopsy taken.   COLONOSCOPY N/A 08/04/2012   SLF: POLYPOID LESION IN AT THE APPENDICEAL ORIFICE/Polyp  in the descending colon/Moderate melanosis throughout the entire examined colon/ The colon IS redundant. small tubular adenoma.    COLONOSCOPY WITH PROPOFOL N/A 11/17/2018   3 tubular adenomas removed, 3 hyperplastic polyps, next colonoscopy in 11/2021.    ESOPHAGOGASTRODUODENOSCOPY (EGD) WITH PROPOFOL N/A 05/23/2020   Procedure: ESOPHAGOGASTRODUODENOSCOPY (EGD) WITH PROPOFOL;  Surgeon: Lanelle Bal, DO;  Location: [facility];  Service: Endoscopy;  Laterality: N/A;  11:45am   FOOT SURGERY Bilateral 2004   hammer toe repairs   PARS PLANA VITRECTOMY W/ REPAIR OF MACULAR HOLE     POLYPECTOMY  11/17/2018   Procedure: POLYPECTOMY;  Surgeon: West Bali, MD;  Location: [facility];  Service: Endoscopy;;   POLYPECTOMY  05/23/2020   Procedure: POLYPECTOMY;  Surgeon: Lanelle Bal, DO;  Location: [facility];  Service: Endoscopy;;   Patient Active Problem List   Diagnosis Date Noted   Diabetes mellitus without complication (HCC)    Bilateral impacted cerumen 11/15/2021   Sensorineural hearing loss (SNHL), bilateral 11/15/2021   Vaginal atrophy 07/10/2021   S/P hysterectomy 07/10/2021   Tenderness of female pelvic organs 07/10/2021   Pelvic pressure  in female 07/10/2021   Vaginal pain 07/10/2021   Vaginal inclusion cyst 07/10/2021   Hyperlipidemia associated with type 2 diabetes mellitus (HCC) 08/07/2020   Wellness examination 07/04/2020   Chest pain 03/20/2020   Early satiety 03/06/2020   Elevated alkaline phosphatase level 03/06/2020   Nausea without vomiting 03/06/2020   Loss of weight 01/03/2020   Appetite loss 01/03/2020   Morbid obesity (HCC) 03/20/2018   Heme positive stool 02/11/2018   Irritable bowel syndrome with both constipation and diarrhea 07/28/2017   Gastroesophageal reflux disease without esophagitis  07/28/2017   Major depression 08/30/2015   Prediabetes 05/31/2015   Encounter for long-term opiate analgesic use 12/29/2014   Chronic back pain 08/19/2013   Obstructive sleep apnea 08/19/2013   Seizures (HCC) 02/22/2013   Peripheral edema 11/11/2012   Peripheral neuropathy 11/11/2012   Unspecified constipation 07/27/2012   Bowel habit changes 07/27/2012   ISCHEMIC COLITIS, HX OF 04/12/2009    PCP: Lilyan Punt MD  REFERRING PROVIDER: Barnett Abu, MD  Next apt: end of August  REFERRING DIAG: M51.24 (ICD-10-CM) - Other intervertebral disc displacement, thoracic region  Rationale for Evaluation and Treatment: Rehabilitation  THERAPY DIAG:  Pain in thoracic spine  Other abnormalities of gait and mobility  Muscle weakness (generalized)  Other symptoms and signs involving the musculoskeletal system  ONSET DATE: 1 year  SUBJECTIVE:                                                                                                                                                                                           SUBJECTIVE STATEMENT: Pt stated mid back pain scale  5/10.  Reports exercises are going well at home.  Reports some soreness following the prone pressups for 24 hours following last session.    EVAL: Patient states pain in mid back that wraps around ribs. Symptoms began with gradual progression about a  year ago. Symptoms increase with standing, walking. Has been canning a lot and that has been aggravating it. By the evening she is paying for it. Has been trying heat and ice with short term relief.   PERTINENT HISTORY:  Chronic back pain, anxiety, depression, DM, HTN, neuropathy, Discectomy at L4-L5 in 2007  PAIN:  Are you having pain? Yes: NPRS scale: 5/10 Pain location: thoracic spine and L ribs Pain description: deep ache Aggravating factors: standing, walking Relieving factors: heat/ ice for short term relief  PRECAUTIONS: None  WEIGHT BEARING  RESTRICTIONS: No  FALLS:  Has patient fallen in last 6 months? No  OCCUPATION: Retired  PLOF: Independent  PATIENT GOALS: get some relief in her back  OBJECTIVE:  Observation: symptoms  at lower/mid curve of thoracic spine   DIAGNOSTIC FINDINGS:  MRI 08/02/22 IMPRESSION: 1. Moderate disc bulge at C6-7 results in mild flattening of the ventral cord, worse from prior. 2. New central disc protrusion at T7-8 results in deformity of the ventral cord. 3. No neural foraminal stenosis in the thoracic spine.  PATIENT SURVEYS:  FOTO not completed due to IT issues  SCREENING FOR RED FLAGS: Bowel or bladder incontinence: No Spinal tumors: No Cauda equina syndrome: No Compression fracture: No Abdominal aneurysm: No  COGNITION: Overall cognitive status: Within functional limits for tasks assessed     SENSATION: WFL  POSTURE: rounded shoulders, forward head, and increased thoracic kyphosis  PALPATION: TTP bilateral thoracic paraspinals around T7-T9  ThoracoLUMBAR ROM:   AROM eval  Flexion 25% limited *  Extension 25% limited *  Right lateral flexion 25% limited *  Left lateral flexion 25% limited *  Right rotation 25% limited *  Left rotation 25% limited *   (Blank rows = not tested) * = symptoms (pain with returning to neutral)  LOWER EXTREMITY ROM:   WFL for tasks assessed  UPPER EXTREMITY MMT:  5/5 shoulder flexion, abduction, elbow flexion,extension  LOWER EXTREMITY MMT:    MMT Right eval Left eval  Hip flexion 5 5  Hip extension 4 4  Hip abduction 4 4  Hip adduction    Hip internal rotation    Hip external rotation    Knee flexion 5 5  Knee extension 5 5  Ankle dorsiflexion 5 5  Ankle plantarflexion    Ankle inversion    Ankle eversion     (Blank rows = not tested)    FUNCTIONAL TESTS:  5 times sit to stand: 25.99 seconds without UE use, pain throughout  2 minute walk test: 350 feet  GAIT: Distance walked: 350 Assistive device utilized:  None Level of assistance: Complete Independence Comments: ; symptom increase after 1 minute, tense throughout upper body   TODAY'S TREATMENT:                                                                                                                              DATE:  11/04/22: FOTO Reviewed goals Seated: shoulders up, back and relax 10x  Scapular retraction- increased pain with relaxation Supine:  Decompression (scapular retraction, cervical retraction, leg extension, leg press)  1 hand chest/1 on stomach deep breathing x  TrA activation paired with exhalation x 23 min  Mini bridges x 10  Isometric adduction on ball, abduction on belt  10/29/22 LTR 10 x 5 second holds  Hooklying glute set 10 x 5 second holds Mini bridge 1 x 10 Prone on elbows 3 minutes Prone press up 1 x 5, 1 x 10  Prone hip extension 1 x 10 bilateral  Seated t/sp extension over chair 2 x 10  Seated t/sp extension over chair with PT overpressure at T7-T9 as fulcrum x 3 Self STM with tennis ball to  thoracic paraspinals   10/25/22 Hooklying glute set 10 x 5 second holds Mini bridge 1 x 10 LTR 10 x 5 second holds    PATIENT EDUCATION:  Education details: 10/29/22: HEP;  EVAL: Patient educated on exam findings, POC, scope of PT, HEP, and posture. Person educated: Patient Education method: Explanation, Demonstration, and Handouts Education comprehension: verbalized understanding, returned demonstration, verbal cues required, and tactile cues required  HOME EXERCISE PROGRAM: Access Code: [email or web address] URL: [email or web address] Date: 10/25/2022 Prepared by: Greig Castilla Zaunegger  Exercises - Hooklying Gluteal Sets  - 2 x daily - 7 x weekly - 1-2 sets - 10 reps - 5 second holds hold - Bridge  - 2 x daily - 7 x weekly - 1-2 sets - 10 reps - Supine Lower Trunk Rotation  - 2 x daily - 7 x weekly - 1-2 sets - 10 reps - 5 second hold  11/04/22: decompression 2-5  ASSESSMENT:  CLINICAL  IMPRESSION: Reviewed goals and educated importance of HEP compliance for maximal benefits.  FOTO complete with score of 42% functional abilities.  Pt limited by pain this session with reports of increased pain upon relaxation.  Presents with raised shoulder and forward head.  Educated on posture and importance of breathing to relax CNS.  Added isometric exercises for core and proximal strengthening that was tolerated well.   OBJECTIVE IMPAIRMENTS: decreased activity tolerance, decreased endurance, decreased mobility, difficulty walking, decreased ROM, decreased strength, increased muscle spasms, impaired flexibility, improper body mechanics, postural dysfunction, and pain.   ACTIVITY LIMITATIONS: carrying, lifting, bending, standing, squatting, stairs, transfers, reach over head, locomotion level, and caring for others  PARTICIPATION LIMITATIONS: meal prep, cleaning, laundry, shopping, community activity, and yard work  PERSONAL FACTORS: Fitness, Time since onset of injury/illness/exacerbation, and 3+ comorbidities: Chronic back pain, anxiety, depression, DM, HTN, neuropathy  are also affecting patient's functional outcome.   REHAB POTENTIAL: Good  CLINICAL DECISION MAKING: Stable/uncomplicated  EVALUATION COMPLEXITY: Low   GOALS: Goals reviewed with patient? Yes  SHORT TERM GOALS: Target date: 11/15/2022    Patient will be independent with HEP in order to improve functional outcomes. Baseline:  Goal status: IN PROGRESS  2.  Patient will report at least 25% improvement in symptoms for improved quality of life. Baseline:  Goal status: IN PROGRESS    LONG TERM GOALS: Target date: 12/06/2022    Patient will report at least 75% improvement in symptoms for improved quality of life. Baseline:  Goal status: IN PROGRESS  2.  Patient will improve FOTO score to at least predicted outcome in order to indicate improved tolerance to activity. Baseline: 11/04/22: 41.9% Goal status:  INITIAL  3.  Patient will demonstrate at least 25% improvement in thoracolumbar ROM in all restricted planes for improved ability to move trunk while completing chores. Baseline: see above Goal status: INITIAL  4.  Patient will be able to ambulate at least 425 feet feet in in order to demonstrate improved tolerance to activity. Baseline: 350 feet Goal status: INITIAL  5.  Patient will demonstrate grade of 5/5 MMT grade in all tested musculature as evidence of improved strength to assist with stair ambulation and gait.   Baseline: see above Goal status: INITIAL  6.  Patient will be able to complete 5x STS in under 15 seconds in order to demo improved functional strength. Baseline: 25.99 seconds Goal status: INITIAL   PLAN:  PT FREQUENCY: 2x/week  PT DURATION: 6 weeks  PLANNED INTERVENTIONS: Therapeutic exercises, Therapeutic activity, Neuromuscular re-education, Balance training, Gait training,  Patient/Family education, Joint manipulation, Joint mobilization, Stair training, Orthotic/Fit training, DME instructions, Aquatic Therapy, Dry Needling, Electrical stimulation, Spinal manipulation, Spinal mobilization, Cryotherapy, Moist heat, Compression bandaging, scar mobilization, Splintting, Taping, Traction, Ultrasound, Ionotophoresis 4mg /ml Dexamethasone, and Manual therapy  PLAN FOR NEXT SESSION: f/u with HEP, spinal mobility: postural, core, hip strength  Becky Sax, LPTA/CLT; CBIS [phone]  Juel Burrow, PTA 11/04/2022, 12:21 PM

## 2022-11-07 ENCOUNTER — Encounter (HOSPITAL_COMMUNITY): Payer: Self-pay | Admitting: Physical Therapy

## 2022-11-07 ENCOUNTER — Ambulatory Visit (HOSPITAL_COMMUNITY): Payer: PPO | Attending: Neurological Surgery | Admitting: Physical Therapy

## 2022-11-07 DIAGNOSIS — M546 Pain in thoracic spine: Secondary | ICD-10-CM | POA: Insufficient documentation

## 2022-11-07 DIAGNOSIS — R29898 Other symptoms and signs involving the musculoskeletal system: Secondary | ICD-10-CM | POA: Diagnosis not present

## 2022-11-07 DIAGNOSIS — R2689 Other abnormalities of gait and mobility: Secondary | ICD-10-CM | POA: Diagnosis not present

## 2022-11-07 DIAGNOSIS — M6281 Muscle weakness (generalized): Secondary | ICD-10-CM | POA: Insufficient documentation

## 2022-11-07 NOTE — Therapy (Signed)
OUTPATIENT PHYSICAL THERAPY TREATMENT   Patient Name: Deborah Carroll MRN: [id number] DOB:1955/11/24, 67 y.o., female Today's Date: 11/07/2022  END OF SESSION:  PT End of Session - 11/07/22 1432     Visit Number 4    Number of Visits 12    Date for PT Re-Evaluation 12/06/22    Authorization Type [employer]    PT Start Time 1431    PT Stop Time 1514    PT Time Calculation (min) 43 min    Activity Tolerance Patient tolerated treatment well    Behavior During Therapy WFL for tasks assessed/performed             Past Medical History:  Diagnosis Date   Anxiety    Chronic pain    Depression    Diabetes mellitus without complication (HCC)    GERD (gastroesophageal reflux disease)    Hypertension    Morbid obesity (HCC) 03/20/2018   Neuropathy    Peripheral neuropathy    Peripheral neuropathy    Seizures (HCC)    last one 1992- Increased BP with delivery and is on meds still.   Sleep apnea    CPAP x 15 years   Past Surgical History:  Procedure Laterality Date   ABDOMINAL HYSTERECTOMY     ovaries present, no cancer,approx 2001   BACK SURGERY     BIOPSY  05/23/2020   Procedure: BIOPSY;  Surgeon: Lanelle Bal, DO;  [facility];  Service: Endoscopy;;   CARPAL TUNNEL RELEASE     Bilateral   CATARACT EXTRACTION W/PHACO Right 06/20/2015   Procedure: CATARACT EXTRACTION PHACO AND INTRAOCULAR LENS PLACEMENT (IOC);  Surgeon: Jethro Bolus, MD;  Location: [facility];  Service: Ophthalmology;  Laterality: Right;  CDE:6.25   CATARACT EXTRACTION W/PHACO Left 08/29/2015   Procedure: CATARACT EXTRACTION PHACO AND INTRAOCULAR LENS PLACEMENT (IOC);  Surgeon: Jethro Bolus, MD;  Location: [facility];  Service: Ophthalmology;  Laterality: Left;  CDE: 4.11   CESAREAN SECTION     x2   CHOLECYSTECTOMY     COLONOSCOPY  10/14/2002   WUJ:[id number] involving descending colon, splenic flexure and distal transverse colon with most extensive inflammation in the splenic flexure area.   These changes are typical of ischemic colitis.  Biopsy taken.   COLONOSCOPY N/A 08/04/2012   SLF: POLYPOID LESION IN AT THE APPENDICEAL ORIFICE/Polyp  in the descending colon/Moderate melanosis throughout the entire examined colon/ The colon IS redundant. small tubular adenoma.    COLONOSCOPY WITH PROPOFOL N/A 11/17/2018   3 tubular adenomas removed, 3 hyperplastic polyps, next colonoscopy in 11/2021.    ESOPHAGOGASTRODUODENOSCOPY (EGD) WITH PROPOFOL N/A 05/23/2020   Procedure: ESOPHAGOGASTRODUODENOSCOPY (EGD) WITH PROPOFOL;  Surgeon: Lanelle Bal, DO;  [facility];  Service: Endoscopy;  Laterality: N/A;  11:45am   FOOT SURGERY Bilateral 2004   hammer toe repairs   PARS PLANA VITRECTOMY W/ REPAIR OF MACULAR HOLE     POLYPECTOMY  11/17/2018   Procedure: POLYPECTOMY;  Surgeon: West Bali, MD;  [facility]: [facility];  Service: Endoscopy;;   POLYPECTOMY  05/23/2020   Procedure: POLYPECTOMY;  Surgeon: Lanelle Bal, DO;  [facility];  Service: Endoscopy;;   Patient Active Problem List   Diagnosis Date Noted   Diabetes mellitus without complication (HCC)    Bilateral impacted cerumen 11/15/2021   Sensorineural hearing loss (SNHL), bilateral 11/15/2021   Vaginal atrophy 07/10/2021   S/P hysterectomy 07/10/2021   Tenderness of female pelvic organs 07/10/2021   Pelvic pressure  in female 07/10/2021   Vaginal pain 07/10/2021   Vaginal inclusion cyst 07/10/2021   Hyperlipidemia associated with type 2 diabetes mellitus (HCC) 08/07/2020   Wellness examination 07/04/2020   Chest pain 03/20/2020   Early satiety 03/06/2020   Elevated alkaline phosphatase level 03/06/2020   Nausea without vomiting 03/06/2020   Loss of weight 01/03/2020   Appetite loss 01/03/2020   Morbid obesity (HCC) 03/20/2018   Heme positive stool 02/11/2018   Irritable bowel syndrome with both constipation and diarrhea 07/28/2017   Gastroesophageal reflux disease without esophagitis  07/28/2017   Major depression 08/30/2015   Prediabetes 05/31/2015   Encounter for long-term opiate analgesic use 12/29/2014   Chronic back pain 08/19/2013   Obstructive sleep apnea 08/19/2013   Seizures (HCC) 02/22/2013   Peripheral edema 11/11/2012   Peripheral neuropathy 11/11/2012   Unspecified constipation 07/27/2012   Bowel habit changes 07/27/2012   ISCHEMIC COLITIS, HX OF 04/12/2009    PCP: Lilyan Punt MD  REFERRING PROVIDER: Barnett Abu, MD  Next apt: end of August  REFERRING DIAG: M51.24 (ICD-10-CM) - Other intervertebral disc displacement, thoracic region  Rationale for Evaluation and Treatment: Rehabilitation  THERAPY DIAG:  Pain in thoracic spine  Other abnormalities of gait and mobility  Muscle weakness (generalized)  Other symptoms and signs involving the musculoskeletal system  ONSET DATE: 1 year  SUBJECTIVE:                                                                                                                                                                                           SUBJECTIVE STATEMENT: Pt stated continued mid back symptoms. Continued rib pain especially later in the day/night. Continues to can a lot at home. Does not feel much of a change.   EVAL: Patient states pain in mid back that wraps around ribs. Symptoms began with gradual progression about a  year ago. Symptoms increase with standing, walking. Has been canning a lot and that has been aggravating it. By the evening she is paying for it. Has been trying heat and ice with short term relief.   PERTINENT HISTORY:  Chronic back pain, anxiety, depression, DM, HTN, neuropathy, Discectomy at L4-L5 in 2007  PAIN:  Are you having pain? Yes: NPRS scale: 8/10 Pain location: thoracic spine and L ribs Pain description: deep ache Aggravating factors: standing, walking Relieving factors: heat/ ice for short term relief  PRECAUTIONS: None  WEIGHT BEARING RESTRICTIONS:  No  FALLS:  Has patient fallen in last 6 months? No  OCCUPATION: Retired  PLOF: Independent  PATIENT GOALS: get some relief in her back  OBJECTIVE:  Observation: symptoms at lower/mid curve  of thoracic spine   DIAGNOSTIC FINDINGS:  MRI 08/02/22 IMPRESSION: 1. Moderate disc bulge at C6-7 results in mild flattening of the ventral cord, worse from prior. 2. New central disc protrusion at T7-8 results in deformity of the ventral cord. 3. No neural foraminal stenosis in the thoracic spine.  PATIENT SURVEYS:  FOTO not completed due to IT issues  SCREENING FOR RED FLAGS: Bowel or bladder incontinence: No Spinal tumors: No Cauda equina syndrome: No Compression fracture: No Abdominal aneurysm: No  COGNITION: Overall cognitive status: Within functional limits for tasks assessed     SENSATION: WFL  POSTURE: rounded shoulders, forward head, and increased thoracic kyphosis  PALPATION: TTP bilateral thoracic paraspinals around T7-T9  ThoracoLUMBAR ROM:   AROM eval  Flexion 25% limited *  Extension 25% limited *  Right lateral flexion 25% limited *  Left lateral flexion 25% limited *  Right rotation 25% limited *  Left rotation 25% limited *   (Blank rows = not tested) * = symptoms (pain with returning to neutral)  LOWER EXTREMITY ROM:   WFL for tasks assessed  UPPER EXTREMITY MMT:  5/5 shoulder flexion, abduction, elbow flexion,extension  LOWER EXTREMITY MMT:    MMT Right eval Left eval  Hip flexion 5 5  Hip extension 4 4  Hip abduction 4 4  Hip adduction    Hip internal rotation    Hip external rotation    Knee flexion 5 5  Knee extension 5 5  Ankle dorsiflexion 5 5  Ankle plantarflexion    Ankle inversion    Ankle eversion     (Blank rows = not tested)    FUNCTIONAL TESTS:  5 times sit to stand: 25.99 seconds without UE use, pain throughout  2 minute walk test: 350 feet  GAIT: Distance walked: 350 Assistive device utilized: None Level of  assistance: Complete Independence Comments: ; symptom increase after 1 minute, tense throughout upper body   TODAY'S TREATMENT:                                                                                                                              DATE:  11/07/22 Discussion of symptoms, activity/positioning modifications DKTC with heels on green ball 2 x 10 with 5 second holds Supine scap retraction with GH ER 2 x 10  Supine shoulder flexion with band between hands RTB 2 x 10 Supine shoulder horizontal abduction RTB 2 x 10   11/04/22: FOTO Reviewed goals Seated: shoulders up, back and relax 10x  Scapular retraction- increased pain with relaxation Supine:  Decompression (scapular retraction, cervical retraction, leg extension, leg press)  1 hand chest/1 on stomach deep breathing x  TrA activation paired with exhalation x 23 min  Mini bridges x 10  Isometric adduction on ball, abduction on belt  10/29/22 LTR 10 x 5 second holds  Hooklying glute set 10 x 5 second holds Mini bridge 1 x 10 Prone on elbows 3 minutes  Prone press up 1 x 5, 1 x 10  Prone hip extension 1 x 10 bilateral  Seated t/sp extension over chair 2 x 10  Seated t/sp extension over chair with PT overpressure at T7-T9 as fulcrum x 3 Self STM with tennis ball to thoracic paraspinals   10/25/22 Hooklying glute set 10 x 5 second holds Mini bridge 1 x 10 LTR 10 x 5 second holds    PATIENT EDUCATION:  Education details: 10/29/22: HEP;  EVAL: Patient educated on exam findings, POC, scope of PT, HEP, and posture. Person educated: Patient Education method: Explanation, Demonstration, and Handouts Education comprehension: verbalized understanding, returned demonstration, verbal cues required, and tactile cues required  HOME EXERCISE PROGRAM: Access Code: [email or web address] URL: [email or web address] Date: 10/25/2022 Prepared by: Greig Castilla Meleni Delahunt  Exercises - Hooklying Gluteal Sets  - 2 x daily - 7  x weekly - 1-2 sets - 10 reps - 5 second holds hold - Bridge  - 2 x daily - 7 x weekly - 1-2 sets - 10 reps - Supine Lower Trunk Rotation  - 2 x daily - 7 x weekly - 1-2 sets - 10 reps - 5 second hold  11/04/22: decompression 2-5  ASSESSMENT:  CLINICAL IMPRESSION: Patient with continued increased symptoms likely due to continued work in kitchen at home. Discussed symptoms and activity/positioning modifications which may help with symptoms. Began additional core and postural strengthening in supine which is tolerated well. Decrease in symptom noted at end of session. Patient will continue to benefit from physical therapy in order to improve function and reduce impairment.    OBJECTIVE IMPAIRMENTS: decreased activity tolerance, decreased endurance, decreased mobility, difficulty walking, decreased ROM, decreased strength, increased muscle spasms, impaired flexibility, improper body mechanics, postural dysfunction, and pain.   ACTIVITY LIMITATIONS: carrying, lifting, bending, standing, squatting, stairs, transfers, reach over head, locomotion level, and caring for others  PARTICIPATION LIMITATIONS: meal prep, cleaning, laundry, shopping, community activity, and yard work  PERSONAL FACTORS: Fitness, Time since onset of injury/illness/exacerbation, and 3+ comorbidities: Chronic back pain, anxiety, depression, DM, HTN, neuropathy  are also affecting patient's functional outcome.   REHAB POTENTIAL: Good  CLINICAL DECISION MAKING: Stable/uncomplicated  EVALUATION COMPLEXITY: Low   GOALS: Goals reviewed with patient? Yes  SHORT TERM GOALS: Target date: 11/15/2022    Patient will be independent with HEP in order to improve functional outcomes. Baseline:  Goal status: IN PROGRESS  2.  Patient will report at least 25% improvement in symptoms for improved quality of life. Baseline:  Goal status: IN PROGRESS    LONG TERM GOALS: Target date: 12/06/2022    Patient will report at least 75%  improvement in symptoms for improved quality of life. Baseline:  Goal status: IN PROGRESS  2.  Patient will improve FOTO score to at least predicted outcome in order to indicate improved tolerance to activity. Baseline: 11/04/22: 41.9% Goal status: INITIAL  3.  Patient will demonstrate at least 25% improvement in thoracolumbar ROM in all restricted planes for improved ability to move trunk while completing chores. Baseline: see above Goal status: INITIAL  4.  Patient will be able to ambulate at least 425 feet feet in in order to demonstrate improved tolerance to activity. Baseline: 350 feet Goal status: INITIAL  5.  Patient will demonstrate grade of 5/5 MMT grade in all tested musculature as evidence of improved strength to assist with stair ambulation and gait.   Baseline: see above Goal status: INITIAL  6.  Patient will be able to complete 5x STS  in under 15 seconds in order to demo improved functional strength. Baseline: 25.99 seconds Goal status: INITIAL   PLAN:  PT FREQUENCY: 2x/week  PT DURATION: 6 weeks  PLANNED INTERVENTIONS: Therapeutic exercises, Therapeutic activity, Neuromuscular re-education, Balance training, Gait training, Patient/Family education, Joint manipulation, Joint mobilization, Stair training, Orthotic/Fit training, DME instructions, Aquatic Therapy, Dry Needling, Electrical stimulation, Spinal manipulation, Spinal mobilization, Cryotherapy, Moist heat, Compression bandaging, scar mobilization, Splintting, Taping, Traction, Ultrasound, Ionotophoresis 4mg /ml Dexamethasone, and Manual therapy  PLAN FOR NEXT SESSION: f/u with HEP, spinal mobility: postural, core, hip strength    Reola Mosher Sally Menard, PT 11/07/2022, 3:14 PM

## 2022-11-11 ENCOUNTER — Ambulatory Visit (HOSPITAL_COMMUNITY): Payer: PPO | Admitting: Physical Therapy

## 2022-11-13 ENCOUNTER — Encounter (HOSPITAL_COMMUNITY): Payer: PPO | Admitting: Physical Therapy

## 2022-11-15 ENCOUNTER — Other Ambulatory Visit: Payer: Self-pay | Admitting: Family Medicine

## 2022-11-18 ENCOUNTER — Encounter (HOSPITAL_COMMUNITY): Payer: PPO | Admitting: Physical Therapy

## 2022-11-21 ENCOUNTER — Other Ambulatory Visit: Payer: Self-pay | Admitting: Family Medicine

## 2022-11-21 ENCOUNTER — Encounter (HOSPITAL_COMMUNITY): Payer: PPO

## 2022-11-21 ENCOUNTER — Telehealth (HOSPITAL_COMMUNITY): Payer: Self-pay

## 2022-11-21 ENCOUNTER — Telehealth: Payer: Self-pay

## 2022-11-21 DIAGNOSIS — Z79891 Long term (current) use of opiate analgesic: Secondary | ICD-10-CM

## 2022-11-21 MED ORDER — HYDROCODONE-ACETAMINOPHEN 10-325 MG PO TABS: ORAL_TABLET | ORAL | 0 refills | Status: DC

## 2022-11-21 MED ORDER — HYDROCODONE-ACETAMINOPHEN 10-325 MG PO TABS
ORAL_TABLET | ORAL | 0 refills | Status: DC
Start: 2022-11-21 — End: 2023-03-04

## 2022-11-21 NOTE — Telephone Encounter (Signed)
Called and left voicemail for patient regarding missed appointment today and reminder of next appointment Monday 8/19 at 11:15.  11:47 AM, 11/21/22 Hicks Feick Small Marija Calamari MPT [place] [facility] [place] [phone] [phone]

## 2022-11-21 NOTE — Telephone Encounter (Signed)
3 prescriptions were sent in today this should cover her for the next 3 months to [facility] [place] thank you Please keep follow-up visit for pain management in late October as planned

## 2022-11-21 NOTE — Telephone Encounter (Signed)
Prescription Request  11/21/2022  LOV: Visit date not found  What is the name of the medication or equipment? HYDROcodone-acetaminophen (NORCO) 10-325 MG tablet   Have you contacted your pharmacy to request a refill? Yes   Which pharmacy would you like this sent to? [facility]   Patient notified that their request is being sent to the clinical staff for review and that they should receive a response within 2 business days.   Please advise at Mobile [phone] (mobile)

## 2022-11-21 NOTE — Telephone Encounter (Signed)
Patient notified and verbalized understanding. 

## 2022-11-25 ENCOUNTER — Telehealth (HOSPITAL_COMMUNITY): Payer: Self-pay | Admitting: Physical Therapy

## 2022-11-25 ENCOUNTER — Encounter (HOSPITAL_COMMUNITY): Payer: PPO | Admitting: Physical Therapy

## 2022-11-25 NOTE — Telephone Encounter (Signed)
Patient no show, spoke with patient who has been sick over last few days and forgot to call in to cancel. Reminded her of next appointment and instructed her to call if unable to attend.  12:08 PM, 11/25/22 Wyman Songster PT, DPT Physical Therapist at [facility]

## 2022-11-27 ENCOUNTER — Encounter (HOSPITAL_COMMUNITY): Payer: PPO | Admitting: Physical Therapy

## 2022-11-29 ENCOUNTER — Encounter (HOSPITAL_COMMUNITY): Payer: Self-pay

## 2022-11-29 NOTE — Therapy (Signed)
[facility] at [facility] [address] [place], [place], [address] Phone: [phone]   Fax:  [phone]  Patient Details  Name: Deborah Carroll MRN: [id number] Date of Birth: [birth year] Referring Provider:  No ref. provider found  PHYSICAL THERAPY DISCHARGE SUMMARY  Visits from Start of Care: 4  Current functional level related to goals / functional outcomes: unknown   Remaining deficits: unknown   Education / Equipment: unknown   Patient agrees to discharge. Patient goals were not met. Patient is being discharged due to not returning since the last visit.   [facility] at [facility] [address] [place], [place], [address] Phone: [phone]   Fax:  [phone]

## 2022-12-06 DIAGNOSIS — M5124 Other intervertebral disc displacement, thoracic region: Secondary | ICD-10-CM | POA: Diagnosis not present

## 2022-12-13 ENCOUNTER — Ambulatory Visit (INDEPENDENT_AMBULATORY_CARE_PROVIDER_SITE_OTHER): Payer: PPO

## 2022-12-13 VITALS — Ht 62.0 in | Wt 225.0 lb

## 2022-12-13 DIAGNOSIS — Z1212 Encounter for screening for malignant neoplasm of rectum: Secondary | ICD-10-CM

## 2022-12-13 DIAGNOSIS — Z1211 Encounter for screening for malignant neoplasm of colon: Secondary | ICD-10-CM

## 2022-12-13 DIAGNOSIS — Z0000 Encounter for general adult medical examination without abnormal findings: Secondary | ICD-10-CM

## 2022-12-13 NOTE — Patient Instructions (Signed)
Deborah Carroll , Thank you for taking time to come for your Medicare Wellness Visit. I appreciate your ongoing commitment to your health goals. Please review the following plan we discussed and let me know if I can assist you in the future.   Referrals/Orders/Follow-Ups/Clinician Recommendations:  The referral to GI that was previously entered was closed. I have entered a new referral for you.   You have been referred to [facility].If you haven't heard from them within the next week, please call them to schedule your appointment.    Address: [address] [place], [place] [address] Phone: [phone]   This is a list of the screening recommended for you and due dates:  Health Maintenance  Topic Date Due   DTaP/Tdap/Td vaccine (1 - Tdap) Never done   Zoster (Shingles) Vaccine (1 of 2) Never done   Eye exam for diabetics  01/07/2019   Colon Cancer Screening  11/16/2021   Mammogram  04/18/2022   Yearly kidney health urinalysis for diabetes  04/19/2022   Complete foot exam   07/19/2022   Hemoglobin A1C  10/22/2022   Flu Shot  11/07/2022   Yearly kidney function blood test for diabetes  04/24/2023   Medicare Annual Wellness Visit  12/13/2023   Pneumonia Vaccine  Completed   DEXA scan (bone density measurement)  Completed   Hepatitis C Screening  Completed   HPV Vaccine  Aged Out   COVID-19 Vaccine  Discontinued    Advanced directives: (ACP Link)Information on Advanced Care Planning can be found at [facility] [place] Advance Health Care Directives Advance Health Care Directives ([email or web address]   Next Medicare Annual Wellness Visit scheduled for next year: Yes Preventive Care 65 Years and Older, Female Preventive care refers to lifestyle choices and visits with your health care provider that can promote health and wellness. Preventive care visits are also called wellness exams. What can I expect for my preventive care visit? Counseling Your health care  provider may ask you questions about your: Medical history, including: Past medical problems. Family medical history. Pregnancy and menstrual history. History of falls. Current health, including: Memory and ability to understand (cognition). Emotional well-being. Home life and relationship well-being. Sexual activity and sexual health. Lifestyle, including: Alcohol, nicotine or tobacco, and drug use. Access to firearms. Diet, exercise, and sleep habits. Work and work Astronomer. Sunscreen use. Safety issues such as seatbelt and bike helmet use. Physical exam Your health care provider will check your: Height and weight. These may be used to calculate your BMI (body mass index). BMI is a measurement that tells if you are at a healthy weight. Waist circumference. This measures the distance around your waistline. This measurement also tells if you are at a healthy weight and may help predict your risk of certain diseases, such as type 2 diabetes and high blood pressure. Heart rate and blood pressure. Body temperature. Skin for abnormal spots. What immunizations do I need?  Vaccines are usually given at various ages, according to a schedule. Your health care provider will recommend vaccines for you based on your age, medical history, and lifestyle or other factors, such as travel or where you work. What tests do I need? Screening Your health care provider may recommend screening tests for certain conditions. This may include: Lipid and cholesterol levels. Hepatitis C test. Hepatitis B test. HIV (human immunodeficiency virus) test. STI (sexually transmitted infection) testing, if you are at risk. Lung cancer screening. Colorectal cancer screening. Diabetes screening. This is done by  checking your blood sugar (glucose) after you have not eaten for a while (fasting). Mammogram. Talk with your health care provider about how often you should have regular mammograms. BRCA-related cancer  screening. This may be done if you have a family history of breast, ovarian, tubal, or peritoneal cancers. Bone density scan. This is done to screen for osteoporosis. Talk with your health care provider about your test results, treatment options, and if necessary, the need for more tests. Follow these instructions at home: Eating and drinking  Eat a diet that includes fresh fruits and vegetables, whole grains, lean protein, and low-fat dairy products. Limit your intake of foods with high amounts of sugar, saturated fats, and salt. Take vitamin and mineral supplements as recommended by your health care provider. Do not drink alcohol if your health care provider tells you not to drink. If you drink alcohol: Limit how much you have to 0-1 drink a day. Know how much alcohol is in your drink. In the [place] one drink equals one 12 oz bottle of beer (355 mL), one 5 oz glass of wine (148 mL), or one 1 oz glass of hard liquor (44 mL). Lifestyle Brush your teeth every morning and night with fluoride toothpaste. Floss one time each day. Exercise for at least 30 minutes 5 or more days each week. Do not use any products that contain nicotine or tobacco. These products include cigarettes, chewing tobacco, and vaping devices, such as e-cigarettes. If you need help quitting, ask your health care provider. Do not use drugs. If you are sexually active, practice safe sex. Use a condom or other form of protection in order to prevent STIs. Take aspirin only as told by your health care provider. Make sure that you understand how much to take and what form to take. Work with your health care provider to find out whether it is safe and beneficial for you to take aspirin daily. Ask your health care provider if you need to take a cholesterol-lowering medicine (statin). Find healthy ways to manage stress, such as: Meditation, yoga, or listening to music. Journaling. Talking to a trusted person. Spending time with  friends and family. Minimize exposure to UV radiation to reduce your risk of skin cancer. Safety Always wear your seat belt while driving or riding in a vehicle. Do not drive: If you have been drinking alcohol. Do not ride with someone who has been drinking. When you are tired or distracted. While texting. If you have been using any mind-altering substances or drugs. Wear a helmet and other protective equipment during sports activities. If you have firearms in your house, make sure you follow all gun safety procedures. What's next? Visit your health care provider once a year for an annual wellness visit. Ask your health care provider how often you should have your eyes and teeth checked. Stay up to date on all vaccines. This information is not intended to replace advice given to you by your health care provider. Make sure you discuss any questions you have with your health care provider. Document Revised: 09/20/2020 Document Reviewed: 09/20/2020 Elsevier Patient Education  2024 Elsevier Inc. Managing Pain Without Opioids Opioids are strong medicines used to treat moderate to severe pain. For some people, especially those who have long-term (chronic) pain, opioids may not be the best choice for pain management due to: Side effects like nausea, constipation, and sleepiness. The risk of addiction (opioid use disorder). The longer you take opioids, the greater your risk of addiction. Pain that  lasts for more than 3 months is called chronic pain. Managing chronic pain usually requires more than one approach and is often provided by a team of health care providers working together (multidisciplinary approach). Pain management may be done at a pain management center or pain clinic. How to manage pain without the use of opioids Use non-opioid medicines Non-opioid medicines for pain may include: Over-the-counter or prescription non-steroidal anti-inflammatory drugs (NSAIDs). These may be the first  medicines used for pain. They work well for muscle and bone pain, and they reduce swelling. Acetaminophen. This over-the-counter medicine may work well for milder pain but not swelling. Antidepressants. These may be used to treat chronic pain. A certain type of antidepressant (tricyclics) is often used. These medicines are given in lower doses for pain than when used for depression. Anticonvulsants. These are usually used to treat seizures but may also reduce nerve (neuropathic) pain. Muscle relaxants. These relieve pain caused by sudden muscle tightening (spasms). You may also use a pain medicine that is applied to the skin as a patch, cream, or gel (topical analgesic), such as a numbing medicine. These may cause fewer side effects than medicines taken by mouth. Do certain therapies as directed Some therapies can help with pain management. They include: Physical therapy. You will do exercises to gain strength and flexibility. A physical therapist may teach you exercises to move and stretch parts of your body that are weak, stiff, or painful. You can learn these exercises at physical therapy visits and practice them at home. Physical therapy may also involve: Massage. Heat wraps or applying heat or cold to affected areas. Electrical signals that interrupt pain signals (transcutaneous electrical nerve stimulation, TENS). Weak lasers that reduce pain and swelling (low-level laser therapy). Signals from your body that help you learn to regulate pain (biofeedback). Occupational therapy. This helps you to learn ways to function at home and work with less pain. Recreational therapy. This involves trying new activities or hobbies, such as a physical activity or drawing. Mental health therapy, including: Cognitive behavioral therapy (CBT). This helps you learn coping skills for dealing with pain. Acceptance and commitment therapy (ACT) to change the way you think and react to pain. Relaxation therapies,  including muscle relaxation exercises and mindfulness-based stress reduction. Pain management counseling. This may be individual, family, or group counseling.  Receive medical treatments Medical treatments for pain management include: Nerve block injections. These may include a pain blocker and anti-inflammatory medicines. You may have injections: Near the spine to relieve chronic back or neck pain. Into joints to relieve back or joint pain. Into nerve areas that supply a painful area to relieve body pain. Into muscles (trigger point injections) to relieve some painful muscle conditions. A medical device placed near your spine to help block pain signals and relieve nerve pain or chronic back pain (spinal cord stimulation device). Acupuncture. Follow these instructions at home Medicines Take over-the-counter and prescription medicines only as told by your health care provider. If you are taking pain medicine, ask your health care providers about possible side effects to watch out for. Do not drive or use heavy machinery while taking prescription opioid pain medicine. Lifestyle  Do not use drugs or alcohol to reduce pain. If you drink alcohol, limit how much you have to: 0-1 drink a day for women who are not pregnant. 0-2 drinks a day for men. Know how much alcohol is in a drink. In the [place] one drink equals one 12 oz bottle of beer (355  mL), one 5 oz glass of wine (148 mL), or one 1 oz glass of hard liquor (44 mL). Do not use any products that contain nicotine or tobacco. These products include cigarettes, chewing tobacco, and vaping devices, such as e-cigarettes. If you need help quitting, ask your health care provider. Eat a healthy diet and maintain a healthy weight. Poor diet and excess weight may make pain worse. Eat foods that are high in fiber. These include fresh fruits and vegetables, whole grains, and beans. Limit foods that are high in fat and processed sugars, such as fried and  sweet foods. Exercise regularly. Exercise lowers stress and may help relieve pain. Ask your health care provider what activities and exercises are safe for you. If your health care provider approves, join an exercise class that combines movement and stress reduction. Examples include yoga and tai chi. Get enough sleep. Lack of sleep may make pain worse. Lower stress as much as possible. Practice stress reduction techniques as told by your therapist. General instructions Work with all your pain management providers to find the treatments that work best for you. You are an important member of your pain management team. There are many things you can do to reduce pain on your own. Consider joining an online or in-person support group for people who have chronic pain. Keep all follow-up visits. This is important. Where to find more information You can find more information about managing pain without opioids from: American Academy of Pain Medicine: painmed.org Institute for Chronic Pain: instituteforchronicpain.org American Chronic Pain Association: theacpa.org Contact a health care provider if: You have side effects from pain medicine. Your pain gets worse or does not get better with treatments or home therapy. You are struggling with anxiety or depression. Summary Many types of pain can be managed without opioids. Chronic pain may respond better to pain management without opioids. Pain is best managed when you and a team of health care providers work together. Pain management without opioids may include non-opioid medicines, medical treatments, physical therapy, mental health therapy, and lifestyle changes. Tell your health care providers if your pain gets worse or is not being managed well enough. This information is not intended to replace advice given to you by your health care provider. Make sure you discuss any questions you have with your health care provider. Document Revised: 07/05/2020  Document Reviewed: 07/05/2020 Elsevier Patient Education  2024 [employer]. Understanding Your Risk for Falls Millions of people have serious injuries from falls each year. It is important to understand your risk of falling. Talk with your health care provider about your risk and what you can do to lower it. If you do have a serious fall, make sure to tell your provider. Falling once raises your risk of falling again. How can falls affect me? Serious injuries from falls are common. These include: Broken bones, such as hip fractures. Head injuries, such as traumatic brain injuries (TBI) or concussions. A fear of falling can cause you to avoid activities and stay at home. This can make your muscles weaker and raise your risk for a fall. What can increase my risk? There are a number of risk factors that increase your risk for falling. The more risk factors you have, the higher your risk of falling. Serious injuries from a fall happen most often to people who are older than 66 years old. Teenagers and young adults ages 68-29 are also at higher risk. Common risk factors include: Weakness in the lower body. Being  generally weak or confused due to long-term (chronic) illness. Dizziness or balance problems. Poor vision. Medicines that cause dizziness or drowsiness. These may include: Medicines for your blood pressure, heart, anxiety, insomnia, or swelling (edema). Pain medicines. Muscle relaxants. Other risk factors include: Drinking alcohol. Having had a fall in the past. Having foot pain or wearing improper footwear. Working at a dangerous job. Having any of the following in your home: Tripping hazards, such as floor clutter or loose rugs. Poor lighting. Pets. Having dementia or memory loss. What actions can I take to lower my risk of falling?     Physical activity Stay physically fit. Do strength and balance exercises. Consider taking a regular class to build strength and balance.  Yoga and tai chi are good options. Vision Have your eyes checked every year and your prescription for glasses or contacts updated as needed. Shoes and walking aids Wear non-skid shoes. Wear shoes that have rubber soles and low heels. Do not wear high heels. Do not walk around the house in socks or slippers. Use a cane or walker as told by your provider. Home safety Attach secure railings on both sides of your stairs. Install grab bars for your bathtub, shower, and toilet. Use a non-skid mat in your bathtub or shower. Attach bath mats securely with double-sided, non-slip rug tape. Use good lighting in all rooms. Keep a flashlight near your bed. Make sure there is a clear path from your bed to the bathroom. Use night-lights. Do not use throw rugs. Make sure all carpeting is taped or tacked down securely. Remove all clutter from walkways and stairways, including extension cords. Repair uneven or broken steps and floors. Avoid walking on icy or slippery surfaces. Walk on the grass instead of on icy or slick sidewalks. Use ice melter to get rid of ice on walkways in the winter. Use a cordless phone. Questions to ask your health care provider Can you help me check my risk for a fall? Do any of my medicines make me more likely to fall? Should I take a vitamin D supplement? What exercises can I do to improve my strength and balance? Should I make an appointment to have my vision checked? Do I need a bone density test to check for weak bones (osteoporosis)? Would it help to use a cane or a walker? Where to find more information Centers for Disease Control and Prevention, STEADI: TonerPromos.no Community-Based Fall Prevention Programs: TonerPromos.no [employer] on Aging: BaseRingTones.pl Contact a health care provider if: You fall at home. You are afraid of falling at home. You feel weak, drowsy, or dizzy. This information is not intended to replace advice given to you by your health care provider.  Make sure you discuss any questions you have with your health care provider. Document Revised: 11/26/2021 Document Reviewed: 11/26/2021 Elsevier Patient Education  2024 [employer].

## 2022-12-13 NOTE — Progress Notes (Signed)
Because this visit was a virtual/telehealth visit,  certain criteria was not obtained, such a blood pressure, CBG if patient is a diabetic, and timed get up and go. Any medications not marked as "taking" was not mentioned during the medication reconciliation part of the visit. Any vitals not documented were not able to be obtained due to this being a telehealth visit. Vitals that have been documented are verbally provided by the patient.  Patient was unable to self-report a recent blood pressure reading due to a lack of equipment at home via telehealth.  Subjective:   Deborah Carroll is a 67 y.o. female who presents for Medicare Annual (Subsequent) preventive examination.  Visit Complete: Virtual  I connected with  Deborah Carroll on 12/13/22 by a audio enabled telemedicine application and verified that I am speaking with the correct person using two identifiers.  Patient Location: Home  Provider Location: Home Office  I discussed the limitations of evaluation and management by telemedicine. The patient expressed understanding and agreed to proceed.  Patient Medicare AWV questionnaire was completed by the patient on na  I have confirmed that all information answered by patient is correct and no changes since this date.  Review of Systems     Cardiac Risk Factors include: advanced age (>10men, >34 women);diabetes mellitus;dyslipidemia;hypertension;obesity (BMI >30kg/m2);sedentary lifestyle;Other (see comment), Risk factor comments: chronic opioid use     Objective:    Today's Vitals   12/13/22 1330 12/13/22 1332  Weight: 225 lb (102.1 kg)   Height: 5\' 2"  (1.575 m)   PainSc:  5    Body mass index is 41.15 kg/m.     12/13/2022    1:30 PM 10/25/2022    9:55 AM 12/04/2021    3:14 PM 10/10/2020    3:09 PM 05/23/2020    7:19 AM 05/19/2020    9:11 AM 03/20/2020    3:50 PM  Advanced Directives  Does Patient Have a Medical Advance Directive? No No No No No No No  Would patient like  information on creating a medical advance directive? No - Patient declined No - Patient declined No - Patient declined No - Patient declined No - Patient declined No - Patient declined No - Patient declined    Current Medications (verified) Outpatient Encounter Medications as of 12/13/2022  Medication Sig   dicyclomine (BENTYL) 10 MG capsule Take 1 capsule by mouth three times daily as needed   hydrochlorothiazide (HYDRODIURIL) 25 MG tablet 1 qam   HYDROcodone-acetaminophen (NORCO) 10-325 MG tablet Take 1 tab four times daily as needed for pain   HYDROcodone-acetaminophen (NORCO) 10-325 MG tablet 1 taken 4 times daily as needed for pain   HYDROcodone-acetaminophen (NORCO) 10-325 MG tablet 1 taken 4 times daily maximum per day as needed for pain   metFORMIN (GLUCOPHAGE-XR) 500 MG 24 hr tablet Take 1 tablet by mouth once daily   pantoprazole (PROTONIX) 40 MG tablet Take 1 tablet (40 mg total) by mouth daily.   phenytoin (DILANTIN) 100 MG ER capsule TAKE 2 CAPSULES BY MOUTH ONCE DAILY IN THE MORNING AND IN THE EVENING   potassium chloride SA (KLOR-CON M) 20 MEQ tablet Take 1 tablet (20 mEq total) by mouth 2 (two) times daily.   pregabalin (LYRICA) 25 MG capsule Take 1 capsule (25 mg total) by mouth 2 (two) times daily.   propranolol (INDERAL) 10 MG tablet Take 1 tablet by mouth twice daily   rosuvastatin (CRESTOR) 5 MG tablet TAKE 1 TABLET(5 MG) BY MOUTH DAILY  sertraline (ZOLOFT) 100 MG tablet TAKE 2 TABLETS BY MOUTH DAILY   torsemide (DEMADEX) 20 MG tablet Take 1 tablet (20 mg total) by mouth daily as needed (fluid or swelling).   No facility-administered encounter medications on file as of 12/13/2022.    Allergies (verified) Patient has no known allergies.   History: Past Medical History:  Diagnosis Date   Anxiety    Chronic pain    Depression    Diabetes mellitus without complication (HCC)    GERD (gastroesophageal reflux disease)    Hypertension    Morbid obesity (HCC) 03/20/2018    Neuropathy    Peripheral neuropathy    Peripheral neuropathy    Seizures (HCC)    last one 1992- Increased BP with delivery and is on meds still.   Sleep apnea    CPAP x 15 years   Past Surgical History:  Procedure Laterality Date   ABDOMINAL HYSTERECTOMY     ovaries present, no cancer,approx 2001   BACK SURGERY     BIOPSY  05/23/2020   Procedure: BIOPSY;  Surgeon: Lanelle Bal, DO;  Location: [facility];  Service: Endoscopy;;   CARPAL TUNNEL RELEASE     Bilateral   CATARACT EXTRACTION W/PHACO Right 06/20/2015   Procedure: CATARACT EXTRACTION PHACO AND INTRAOCULAR LENS PLACEMENT (IOC);  Surgeon: Jethro Bolus, MD;  Location: [facility];  Service: Ophthalmology;  Laterality: Right;  CDE:6.25   CATARACT EXTRACTION W/PHACO Left 08/29/2015   Procedure: CATARACT EXTRACTION PHACO AND INTRAOCULAR LENS PLACEMENT (IOC);  Surgeon: Jethro Bolus, MD;  Location: [facility];  Service: Ophthalmology;  Laterality: Left;  CDE: 4.11   CESAREAN SECTION     x2   CHOLECYSTECTOMY     COLONOSCOPY  10/14/2002   [id number] involving descending colon, splenic flexure and distal transverse colon with most extensive inflammation in the splenic flexure area.  These changes are typical of ischemic colitis.  Biopsy taken.   COLONOSCOPY N/A 08/04/2012   SLF: POLYPOID LESION IN AT THE APPENDICEAL ORIFICE/Polyp  in the descending colon/Moderate melanosis throughout the entire examined colon/ The colon IS redundant. small tubular adenoma.    COLONOSCOPY WITH PROPOFOL N/A 11/17/2018   3 tubular adenomas removed, 3 hyperplastic polyps, next colonoscopy in 11/2021.    ESOPHAGOGASTRODUODENOSCOPY (EGD) WITH PROPOFOL N/A 05/23/2020   Procedure: ESOPHAGOGASTRODUODENOSCOPY (EGD) WITH PROPOFOL;  Surgeon: Lanelle Bal, DO;  [facility]: [facility];  Service: Endoscopy;  Laterality: N/A;  11:45am   FOOT SURGERY Bilateral 2004   hammer toe repairs   PARS PLANA VITRECTOMY W/ REPAIR OF MACULAR HOLE     POLYPECTOMY  11/17/2018    Procedure: POLYPECTOMY;  Surgeon: West Bali, MD;  Location: [facility];  Service: Endoscopy;;   POLYPECTOMY  05/23/2020   Procedure: POLYPECTOMY;  Surgeon: Lanelle Bal, DO;  Location: [facility];  Service: Endoscopy;;   Family History  Problem Relation Age of Onset   Hypertension Maternal Grandmother    Heart attack Maternal Grandfather    Emphysema Father    Asthma Father    Colon polyps Mother 69       benign    Colon cancer Neg Hx    Liver disease Neg Hx    Social History   Socioeconomic History   Marital status: Married    Spouse name: Not on file   Number of children: 2   Years of education: 12   Highest education level: 12th grade  Occupational History   Occupation: disability  Tobacco Use  Smoking status: Never   Smokeless tobacco: Never   Tobacco comments:    smoked x 4 years many years ago  Vaping Use   Vaping status: Never Used  Substance and Sexual Activity   Alcohol use: Not Currently   Drug use: No   Sexual activity: Yes    Birth control/protection: Surgical    Comment: hyst  Other Topics Concern   Not on file  Social History Narrative   lives with husband   Social Determinants of Health   Financial Resource Strain: Low Risk  (12/13/2022)   Overall Financial Resource Strain (CARDIA)    Difficulty of Paying Living Expenses: Not hard at all  Food Insecurity: No Food Insecurity (12/13/2022)   Hunger Vital Sign    Worried About Running Out of Food in the Last Year: Never true    Ran Out of Food in the Last Year: Never true  Transportation Needs: No Transportation Needs (12/13/2022)   PRAPARE - Administrator, Civil Service (Medical): No    Lack of Transportation (Non-Medical): No  Physical Activity: Insufficiently Active (12/13/2022)   Exercise Vital Sign    Days of Exercise per Week: 3 days    Minutes of Exercise per Session: 20 min  Stress: Stress Concern Present (12/13/2022)   [employer] of Occupational Health -  Occupational Stress Questionnaire    Feeling of Stress : To some extent  Social Connections: Moderately Integrated (12/13/2022)   Social Connection and Isolation Panel [NHANES]    Frequency of Communication with Friends and Family: More than three times a week    Frequency of Social Gatherings with Friends and Family: More than three times a week    Attends Religious Services: More than 4 times per year    Active Member of [employer] or Organizations: No    Attends Engineer, structural: Never    Marital Status: Married    Tobacco Counseling Counseling given: Yes Tobacco comments: smoked x 4 years many years ago   Clinical Intake:  Pre-visit preparation completed: Yes  Pain : 0-10 Pain Score: 5  Pain Type: Chronic pain Pain Location: Back Pain Orientation: Lower Pain Descriptors / Indicators: Aching, Sharp, Throbbing Pain Onset: More than a month ago Pain Frequency: Constant     BMI - recorded: 41.15 Nutritional Status: BMI > 30  Obese Nutritional Risks: None Diabetes: Yes CBG done?: No Did pt. bring in CBG monitor from home?: No  How often do you need to have someone help you when you read instructions, pamphlets, or other written materials from your doctor or pharmacy?: 1 - Never  Interpreter Needed?: No  Information entered by :: Abby Samhitha Rosen, CMA   Activities of Daily Living    12/13/2022    1:44 PM  In your present state of health, do you have any difficulty performing the following activities:  Hearing? 1  Comment wears hearing aids  Vision? 0  Difficulty concentrating or making decisions? 0  Walking or climbing stairs? 1  Comment sometimes hard to do due to chronic low back pain  Dressing or bathing? 0  Doing errands, shopping? 0  Preparing Food and eating ? N  Using the Toilet? N  In the past six months, have you accidently leaked urine? N  Do you have problems with loss of bowel control? N  Managing your Medications? N  Managing your Finances? N   Housekeeping or managing your Housekeeping? Y  Comment husband helps with certain chores    Patient  Care Team: Babs Sciara, MD as PCP - General (Family Medicine) Meriam Sprague, MD (Inactive) as PCP - Cardiology (Cardiology) West Bali, MD (Inactive) as Attending Physician (Gastroenterology)  Indicate any recent Medical Services you may have received from other than [facility] providers in the past year (date may be approximate).     Assessment:   This is a routine wellness examination for [facility].  Hearing/Vision screen Hearing Screening - Comments:: Wears hearing aids and has no difficulty hearing if she is wearing the aids  Vision Screening - Comments:: Sees Dr. Daisy Lazar w/ [facility] in [place]. Time to schedule yearly exam.    Goals Addressed             This Visit's Progress    Patient Stated       Increase activity       Depression Screen    12/13/2022    1:36 PM 10/30/2022    9:27 AM 07/30/2022    3:44 PM 04/23/2022   11:27 AM 03/05/2022    4:37 PM 12/04/2021    3:12 PM 07/10/2021   11:25 AM  PHQ 2/9 Scores  PHQ - 2 Score 0 2 2 2 2 1 2   PHQ- 9 Score 1 7 8 12 6 1 9     Fall Risk    12/13/2022    1:44 PM 10/30/2022    9:27 AM 07/30/2022    3:44 PM 04/23/2022   11:26 AM 03/05/2022    4:37 PM  Fall Risk   Falls in the past year? 0 0 0 0 0  Number falls in past yr: 0 0  0 0  Injury with Fall? 0 0  0 0  Risk for fall due to : Impaired mobility  No Fall Risks  No Fall Risks  Follow up Falls prevention discussed;Education provided  Falls evaluation completed  Falls evaluation completed    MEDICARE RISK AT HOME: Medicare Risk at Home Any stairs in or around the home?: Yes If so, are there any without handrails?: No Home free of loose throw rugs in walkways, pet beds, electrical cords, etc?: Yes Adequate lighting in your home to reduce risk of falls?: Yes Life alert?: No Use of a cane, walker or w/c?: Yes Shower chair or bench in shower?:  Yes Elevated toilet seat or a handicapped toilet?: Yes  TIMED UP AND GO:  Was the test performed?  No    Cognitive Function:        12/13/2022    1:36 PM 12/04/2021    3:17 PM  6CIT Screen  What Year? 0 points 0 points  What month? 0 points 0 points  What time? 0 points 0 points  Count back from 20 0 points 0 points  Months in reverse 0 points 0 points  Repeat phrase 0 points 0 points  Total Score 0 points 0 points    Immunizations Immunization History  Administered Date(s) Administered   Influenza Split 02/22/2013   Influenza,inj,Quad PF,6+ Mos 02/15/2014, 03/20/2018   Influenza-Unspecified 01/22/2012   PNEUMOCOCCAL CONJUGATE-20 01/21/2022   Pneumococcal Polysaccharide-23 08/07/2020    TDAP status: Due, Education has been provided regarding the importance of this vaccine. Advised may receive this vaccine at local pharmacy or Health Dept. Aware to provide a copy of the vaccination record if obtained from local pharmacy or Health Dept. Verbalized acceptance and understanding.  Flu Vaccine status: Due, Education has been provided regarding the importance of this vaccine. Advised may receive this vaccine at  local pharmacy or Health Dept. Aware to provide a copy of the vaccination record if obtained from local pharmacy or Health Dept. Verbalized acceptance and understanding.  Pneumococcal vaccine status: Up to date  Covid-19 vaccine status: Information provided on how to obtain vaccines.   Qualifies for Shingles Vaccine? Yes   Zostavax completed No   Shingrix Completed?: No.    Education has been provided regarding the importance of this vaccine. Patient has been advised to call insurance company to determine out of pocket expense if they have not yet received this vaccine. Advised may also receive vaccine at local pharmacy or Health Dept. Verbalized acceptance and understanding.  Screening Tests Health Maintenance  Topic Date Due   DTaP/Tdap/Td (1 - Tdap) Never done    Zoster Vaccines- Shingrix (1 of 2) Never done   OPHTHALMOLOGY EXAM  01/07/2019   Colonoscopy  11/16/2021   MAMMOGRAM  04/18/2022   Diabetic kidney evaluation - Urine ACR  04/19/2022   FOOT EXAM  07/19/2022   HEMOGLOBIN A1C  10/22/2022   INFLUENZA VACCINE  11/07/2022   Medicare Annual Wellness (AWV)  12/05/2022   Diabetic kidney evaluation - eGFR measurement  04/24/2023   Pneumonia Vaccine 84+ Years old  Completed   DEXA SCAN  Completed   Hepatitis C Screening  Completed   HPV VACCINES  Aged Out   COVID-19 Vaccine  Discontinued    Health Maintenance  Health Maintenance Due  Topic Date Due   DTaP/Tdap/Td (1 - Tdap) Never done   Zoster Vaccines- Shingrix (1 of 2) Never done   OPHTHALMOLOGY EXAM  01/07/2019   Colonoscopy  11/16/2021   MAMMOGRAM  04/18/2022   Diabetic kidney evaluation - Urine ACR  04/19/2022   FOOT EXAM  07/19/2022   HEMOGLOBIN A1C  10/22/2022   INFLUENZA VACCINE  11/07/2022   Medicare Annual Wellness (AWV)  12/05/2022    Colorectal cancer screening: Referral to GI placed 12/13/2022. Pt aware the office will call re: appt.  Mammogram status: Ordered 06/10/2022. Pt provided with contact info and advised to call to schedule appt.   Bone Density status: Completed 12/26/21. Results reflect: Bone density results: NORMAL. Repeat every 5 years.  Lung Cancer Screening: (Low Dose CT Chest recommended if Age 18-80 years, 20 pack-year currently smoking OR have quit w/in 15years.) does not qualify.   Lung Cancer Screening Referral: na  Additional Screening:  Hepatitis C Screening: does not qualify; Completed 10/24/2016  Vision Screening: Recommended annual ophthalmology exams for early detection of glaucoma and other disorders of the eye. Is the patient up to date with their annual eye exam?  No  Who is the provider or what is the name of the office in which the patient attends annual eye exams? [facility] If pt is not established with a provider, would they like to be  referred to a provider to establish care? No   Dental Screening: Recommended annual dental exams for proper oral hygiene  Diabetic Foot Exam: Diabetic Foot Exam: Overdue, Pt has been advised about the importance in completing this exam. Pt is scheduled for diabetic foot exam on provider made aware.  Community Resource Referral / Chronic Care Management: CRR required this visit?  No   CCM required this visit?  No     Plan:     I have personally reviewed and noted the following in the patient's chart:   Medical and social history Use of alcohol, tobacco or illicit drugs  Current medications and supplements including opioid prescriptions. Patient  is currently taking opioid prescriptions. Information provided to patient regarding non-opioid alternatives. Patient advised to discuss non-opioid treatment plan with their provider. Functional ability and status Nutritional status Physical activity Advanced directives List of other physicians Hospitalizations, surgeries, and ER visits in previous 12 months Vitals Screenings to include cognitive, depression, and falls Referrals and appointments  In addition, I have reviewed and discussed with patient certain preventive protocols, quality metrics, and best practice recommendations. A written personalized care plan for preventive services as well as general preventive health recommendations were provided to patient.     Jordan Hawks Oluwademilade Kellett, CMA   12/13/2022   After Visit Summary: (MyChart) Due to this being a telephonic visit, the after visit summary with patients personalized plan was offered to patient via MyChart   Nurse Notes: Patient is due for a diabetic foot exam to close HM gaps.

## 2022-12-16 ENCOUNTER — Encounter: Payer: Self-pay | Admitting: *Deleted

## 2022-12-17 ENCOUNTER — Encounter: Payer: Self-pay | Admitting: Family Medicine

## 2022-12-25 DIAGNOSIS — M5124 Other intervertebral disc displacement, thoracic region: Secondary | ICD-10-CM | POA: Insufficient documentation

## 2023-01-03 ENCOUNTER — Encounter: Payer: Self-pay | Admitting: Family Medicine

## 2023-01-13 ENCOUNTER — Ambulatory Visit (HOSPITAL_COMMUNITY): Payer: PPO

## 2023-01-21 ENCOUNTER — Other Ambulatory Visit: Payer: Self-pay | Admitting: Family Medicine

## 2023-01-23 ENCOUNTER — Ambulatory Visit (HOSPITAL_COMMUNITY): Payer: PPO

## 2023-01-30 ENCOUNTER — Ambulatory Visit (HOSPITAL_COMMUNITY)
Admission: RE | Admit: 2023-01-30 | Discharge: 2023-01-30 | Disposition: A | Discharge status: Home / Self Care | Payer: PPO | Source: Ambulatory Visit | Attending: Family Medicine | Admitting: Family Medicine

## 2023-01-30 ENCOUNTER — Encounter (HOSPITAL_COMMUNITY): Payer: Self-pay

## 2023-01-30 DIAGNOSIS — Z1231 Encounter for screening mammogram for malignant neoplasm of breast: Secondary | ICD-10-CM | POA: Diagnosis not present

## 2023-01-30 DIAGNOSIS — G4733 Obstructive sleep apnea (adult) (pediatric): Secondary | ICD-10-CM | POA: Diagnosis not present

## 2023-02-03 ENCOUNTER — Other Ambulatory Visit: Payer: Self-pay | Admitting: Family Medicine

## 2023-02-06 ENCOUNTER — Other Ambulatory Visit: Payer: Self-pay

## 2023-02-06 ENCOUNTER — Ambulatory Visit: Payer: PPO | Admitting: Family Medicine

## 2023-02-06 NOTE — Telephone Encounter (Signed)
May have 6 months on these medicines

## 2023-02-08 ENCOUNTER — Other Ambulatory Visit: Payer: Self-pay | Admitting: Family Medicine

## 2023-02-19 ENCOUNTER — Other Ambulatory Visit: Payer: Self-pay | Admitting: Family Medicine

## 2023-02-24 ENCOUNTER — Other Ambulatory Visit: Payer: Self-pay | Admitting: Family Medicine

## 2023-02-24 MED ORDER — PREGABALIN 25 MG PO CAPS: 25.0000 mg | ORAL_CAPSULE | Freq: Two times a day (BID) | ORAL | 0 refills | Status: DC

## 2023-02-24 NOTE — Progress Notes (Deleted)
GI Office Note    Referring Provider: Babs Sciara, MD Primary Care Physician:  Babs Sciara, MD Primary Gastroenterologist: Hennie Duos. Marletta Lor, DO  Date:  02/24/2023  ID:  Deborah, Carroll [birth year], MRN [id number]   Chief Complaint   No chief complaint on file.   History of Present Illness  Deborah Carroll is a 67 y.o. female with a history of *** presenting today with complaint of   Colonoscopy August 2020: -9 polyps removed, 8 were hyperplastic  EGD February 2022: - Gastritis. Biopsied.  - Multiple gastric polyps. Resected and retrieved.  - Normal duodenal bulb, first portion of the duodenum and second portion of the duodenum. Biopsied. 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   Last office visit 07/12/20.  Advised to proceed with genetic testing for STK 11 given gastric polyps.  Advise possible IBS with diarrhea predominant symptoms as etiology of chronic diarrhea, also bile acid component of potential differential given history of cholecystectomy.  Advised to also consider microscopic colitis.  Will increase dicyclomine to twice daily and she was counseled on more aggressive use of Imodium as needed and advised to follow-up in 6 weeks.  Consider repeat colonoscopy to rule out microscopic colitis and can also consider trial of colestipol.Marland Kitchen  STK 11 testing for Peutz-Jeghers syndrome was negative.  Today:    Wt Readings from Last 3 Encounters:  12/13/22 225 lb (102.1 kg)  10/30/22 236 lb 3.2 oz (107.1 kg)  07/30/22 235 lb 6.4 oz (106.8 kg)    Current Outpatient Medications  Medication Sig Dispense Refill   dicyclomine (BENTYL) 10 MG capsule Take 1 capsule by mouth three times daily as needed 90 capsule 0   hydrochlorothiazide (HYDRODIURIL) 25 MG tablet 1 qam 90 tablet 3   HYDROcodone-acetaminophen (NORCO) 10-325 MG tablet Take 1 tab four times daily as needed for pain 120 tablet 0    HYDROcodone-acetaminophen (NORCO) 10-325 MG tablet 1 taken 4 times daily as needed for pain 120 tablet 0   HYDROcodone-acetaminophen (NORCO) 10-325 MG tablet 1 taken 4 times daily maximum per day as needed for pain 120 tablet 0   metFORMIN (GLUCOPHAGE-XR) 500 MG 24 hr tablet Take 1 tablet by mouth once daily 90 tablet 2   pantoprazole (PROTONIX) 40 MG tablet Take 1 tablet (40 mg total) by mouth daily. 90 tablet 3   phenytoin (DILANTIN) 100 MG ER capsule TAKE 2 CAPSULES BY MOUTH ONCE DAILY IN THE MORNING AND IN THE EVENING 360 capsule 0   potassium chloride SA (KLOR-CON M) 20 MEQ tablet Take 1 tablet (20 mEq total) by mouth 2 (two) times daily. 180 tablet 3   pregabalin (LYRICA) 25 MG capsule Take 1 capsule (25 mg total) by mouth 2 (two) times daily. 60 capsule 0   propranolol (INDERAL) 10 MG tablet Take 1 tablet by mouth twice daily 60 tablet 0   rosuvastatin (CRESTOR) 5 MG tablet TAKE 1 TABLET(5 MG) BY MOUTH DAILY 90 tablet 3   sertraline (ZOLOFT) 100 MG tablet TAKE 2 TABLETS BY MOUTH DAILY 180 tablet 3   torsemide (DEMADEX) 20 MG tablet Take 1 tablet (20 mg total) by mouth daily as needed (fluid or swelling). 30 tablet 2   No current facility-administered medications for this visit.    Past Medical History:  Diagnosis Date   Anxiety    Chronic pain    Depression    Diabetes mellitus without complication (HCC)  GERD (gastroesophageal reflux disease)    Hypertension    Morbid obesity (HCC) 03/20/2018   Neuropathy    Peripheral neuropathy    Peripheral neuropathy    Seizures (HCC)    last one 1992- Increased BP with delivery and is on meds still.   Sleep apnea    CPAP x 15 years    Past Surgical History:  Procedure Laterality Date   ABDOMINAL HYSTERECTOMY     ovaries present, no cancer,approx 2001   BACK SURGERY     BIOPSY  05/23/2020   Procedure: BIOPSY;  Surgeon: Lanelle Bal, DO;  Location: [facility];  Service: Endoscopy;;   BREAST BIOPSY Left 08/19/2006   Benign  calcifications and sclerotic lobules   CARPAL TUNNEL RELEASE     Bilateral   CATARACT EXTRACTION W/PHACO Right 06/20/2015   Procedure: CATARACT EXTRACTION PHACO AND INTRAOCULAR LENS PLACEMENT (IOC);  Surgeon: Jethro Bolus, MD;  Location: [facility];  Service: Ophthalmology;  Laterality: Right;  CDE:6.25   CATARACT EXTRACTION W/PHACO Left 08/29/2015   Procedure: CATARACT EXTRACTION PHACO AND INTRAOCULAR LENS PLACEMENT (IOC);  Surgeon: Jethro Bolus, MD;  Location: [facility];  Service: Ophthalmology;  Laterality: Left;  CDE: 4.11   CESAREAN SECTION     x2   CHOLECYSTECTOMY     COLONOSCOPY  10/14/2002   WJX:[id number] involving descending colon, splenic flexure and distal transverse colon with most extensive inflammation in the splenic flexure area.  These changes are typical of ischemic colitis.  Biopsy taken.   COLONOSCOPY N/A 08/04/2012   SLF: POLYPOID LESION IN AT THE APPENDICEAL ORIFICE/Polyp  in the descending colon/Moderate melanosis throughout the entire examined colon/ The colon IS redundant. small tubular adenoma.    COLONOSCOPY WITH PROPOFOL N/A 11/17/2018   3 tubular adenomas removed, 3 hyperplastic polyps, next colonoscopy in 11/2021.    ESOPHAGOGASTRODUODENOSCOPY (EGD) WITH PROPOFOL N/A 05/23/2020   Procedure: ESOPHAGOGASTRODUODENOSCOPY (EGD) WITH PROPOFOL;  Surgeon: Lanelle Bal, DO;  Location: [facility];  Service: Endoscopy;  Laterality: N/A;  11:45am   FOOT SURGERY Bilateral 2004   hammer toe repairs   PARS PLANA VITRECTOMY W/ REPAIR OF MACULAR HOLE     POLYPECTOMY  11/17/2018   Procedure: POLYPECTOMY;  Surgeon: West Bali, MD;  Location: [facility];  Service: Endoscopy;;   POLYPECTOMY  05/23/2020   Procedure: POLYPECTOMY;  Surgeon: Lanelle Bal, DO;  Location: [facility];  Service: Endoscopy;;    Family History  Problem Relation Age of Onset   Hypertension Maternal Grandmother    Heart attack Maternal Grandfather    Emphysema Father    Asthma Father     Colon polyps Mother 38       benign    Colon cancer Neg Hx    Liver disease Neg Hx     Allergies as of 02/25/2023   (No Known Allergies)    Social History   Socioeconomic History   Marital status: Married    Spouse name: Not on file   Number of children: 2   Years of education: 12   Highest education level: 12th grade  Occupational History   Occupation: disability  Tobacco Use   Smoking status: Never   Smokeless tobacco: Never   Tobacco comments:    smoked x 4 years many years ago  Vaping Use   Vaping status: Never Used  Substance and Sexual Activity   Alcohol use: Not Currently   Drug use: No   Sexual activity: Yes    Birth control/protection:  Surgical    Comment: hyst  Other Topics Concern   Not on file  Social History Narrative   lives with husband   Social Determinants of Health   Financial Resource Strain: Low Risk  (12/13/2022)   Overall Financial Resource Strain (CARDIA)    Difficulty of Paying Living Expenses: Not hard at all  Food Insecurity: No Food Insecurity (12/13/2022)   Hunger Vital Sign    Worried About Running Out of Food in the Last Year: Never true    Ran Out of Food in the Last Year: Never true  Transportation Needs: No Transportation Needs (12/13/2022)   PRAPARE - Administrator, Civil Service (Medical): No    Lack of Transportation (Non-Medical): No  Physical Activity: Insufficiently Active (12/13/2022)   Exercise Vital Sign    Days of Exercise per Week: 3 days    Minutes of Exercise per Session: 20 min  Stress: Stress Concern Present (12/13/2022)   [employer] of Occupational Health - Occupational Stress Questionnaire    Feeling of Stress : To some extent  Social Connections: Moderately Integrated (12/13/2022)   Social Connection and Isolation Panel [NHANES]    Frequency of Communication with Friends and Family: More than three times a week    Frequency of Social Gatherings with Friends and Family: More than three times a week     Attends Religious Services: More than 4 times per year    Active Member of [employer] or Organizations: No    Attends Banker Meetings: Never    Marital Status: Married     Review of Systems   Gen: Denies fever, chills, anorexia. Denies fatigue, weakness, weight loss.  CV: Denies chest pain, palpitations, syncope, peripheral edema, and claudication. Resp: Denies dyspnea at rest, cough, wheezing, coughing up blood, and pleurisy. GI: See HPI Derm: Denies rash, itching, dry skin Psych: Denies depression, anxiety, memory loss, confusion. No homicidal or suicidal ideation.  Heme: Denies bruising, bleeding, and enlarged lymph nodes.  Physical Exam   There were no vitals taken for this visit.  General:   Alert and oriented. No distress noted. Pleasant and cooperative.  Head:  Normocephalic and atraumatic. Eyes:  Conjuctiva clear without scleral icterus. Mouth:  Oral mucosa pink and moist. Good dentition. No lesions. Lungs:  Clear to auscultation bilaterally. No wheezes, rales, or rhonchi. No distress.  Heart:  S1, S2 present without murmurs appreciated.  Abdomen:  +BS, soft, non-tender and non-distended. No rebound or guarding. No HSM or masses noted. Rectal: *** Msk:  Symmetrical without gross deformities. Normal posture. Extremities:  Without edema. Neurologic:  Alert and  oriented x4 Psych:  Alert and cooperative. Normal mood and affect.  Assessment  Deborah Carroll is a 67 y.o. female with a history of *** presenting today with   Diarrhea:   History of colon polyps:   PLAN   ***     Brooke Bonito, MSN, FNP-BC, AGACNP-BC [facility]

## 2023-02-25 ENCOUNTER — Ambulatory Visit: Payer: PPO | Admitting: Gastroenterology

## 2023-02-26 ENCOUNTER — Ambulatory Visit (INDEPENDENT_AMBULATORY_CARE_PROVIDER_SITE_OTHER): Payer: PPO | Admitting: Family Medicine

## 2023-02-26 VITALS — BP 127/84 | HR 65 | Temp 97.5°F | Ht 62.0 in | Wt 236.6 lb

## 2023-02-26 DIAGNOSIS — M545 Low back pain, unspecified: Secondary | ICD-10-CM | POA: Diagnosis not present

## 2023-02-26 DIAGNOSIS — E785 Hyperlipidemia, unspecified: Secondary | ICD-10-CM | POA: Diagnosis not present

## 2023-02-26 DIAGNOSIS — R768 Other specified abnormal immunological findings in serum: Secondary | ICD-10-CM | POA: Diagnosis not present

## 2023-02-26 DIAGNOSIS — E1169 Type 2 diabetes mellitus with other specified complication: Secondary | ICD-10-CM

## 2023-02-26 DIAGNOSIS — E119 Type 2 diabetes mellitus without complications: Secondary | ICD-10-CM | POA: Diagnosis not present

## 2023-02-26 DIAGNOSIS — R748 Abnormal levels of other serum enzymes: Secondary | ICD-10-CM | POA: Diagnosis not present

## 2023-02-26 DIAGNOSIS — G8929 Other chronic pain: Secondary | ICD-10-CM | POA: Diagnosis not present

## 2023-02-26 DIAGNOSIS — M25552 Pain in left hip: Secondary | ICD-10-CM

## 2023-02-26 DIAGNOSIS — M25551 Pain in right hip: Secondary | ICD-10-CM

## 2023-02-27 NOTE — Progress Notes (Signed)
Subjective:    Patient ID: Deborah Carroll, female    DOB: Jun 04, 1955, 67 y.o.   MRN: [id number]  HPI This patient was seen today for chronic pain  The medication list was reviewed and updated.   Location of Pain for which the patient has been treated with regarding narcotics: Chronic low back pain bilateral hip pain  Onset of this pain: Present for years   -Compliance with medication: Good compliance  - Number patient states they take daily: Anywhere between 2-1/2 to 4/day  -Reason for ongoing use of opioids chronic pain unrelieved by other measures  What other measures have been tried outside of opioids she has tried Tylenol Lyrica she is also tried visits with specialists she has had previous back surgery  In the ongoing specialists regarding this condition she has seen back specialist even in the past 6 months  -when was the last dose patient took?  Earlier today  The patient was advised the importance of maintaining medication and not using illegal substances with these.  Here for refills and follow up  The patient was educated that we can provide 3 monthly scripts for their medication, it is their responsibility to follow the instructions.  Side effects or complications from medications: Denies side effects  Patient is aware that pain medications are meant to minimize the severity of the pain to allow their pain levels to improve to allow for better function. They are aware of that pain medications cannot totally remove their pain.  Due for UDT ( at least once per year) (pain management contract is also completed at the time of the UDT): She will be due early next year  Scale of 1 to 10 ( 1 is least 10 is most) Your pain level without the medicine: 9 Your pain level with medication 5  Scale 1 to 10 ( 1-helps very little, 10 helps very well) How well does your pain medication reduce your pain so you can function better through out the day? 8  Quality of the pain:  Throbbing aching  Persistence of the pain: Persistent all the time  Modifying factors: Worse with activity  She relates that her pain is a lot worse than what it used to be she states is in her lower back but also goes into her hips she states that when she walks a limited range of motion sometimes not more than 30 yards she has to stop because of bilateral hip pain it goes away after a few minutes and she is able to walk some more she states there has been no injuries her hydrocodone does help with her pain but is not helping with this issue  She does still relate the neuropathy in her feet states the hydrocodone helps with that.  She is on a low-dose of Lyrica but she states he does get drowsy with her medicines about an hour and a half after taking her medicine she feels a little sleepy she does not feel drugged.  We did discuss how she could stop the Lyrica for a week or 2 and see if that makes a big difference and she needs to let us know based on that  She also deals with morbid obesity it is difficult for her to lose weight she is trying to eat healthy and stay active.  She is on metformin.  She would benefit from a GLP-1 but she needs to do some up-to-date lab work before we can decide  Hyperlipidemia she does take  her statin on a regular basis  Blood pressure takes her medicine on a regular basis  Stress related issues denies being depressed currently takes her Zoloft does well with that      Review of Systems     Objective:   Physical Exam  General-in no acute distress Eyes-no discharge Lungs-respiratory rate normal, CTA CV-no murmurs,RRR Extremities skin warm dry trace ankle edema Neuro grossly normal Behavior normal, alert No hip tenderness fair ROM negative straight leg raise       Assessment & Plan:  1. Diabetes mellitus without complication (HCC) The patient was seen today as part of a comprehensive visit for diabetes. The importance of keeping her A1c at or  below 7 range was discussed.  Discussed diet, activity, and medication compliance Emphasized healthy eating primarily with vegetables fruits and if utilizing meats lean meats such as chicken or fish grilled baked broiled Avoid sugary drinks Minimize and avoid processed foods Fit in regular physical activity preferably 25 to 30 minutes 4 times per week Standard follow-up visit recommended.  Patient aware lack of control and follow-up increases risk of diabetic complications. Regular follow-up visits Yearly ophthalmology Yearly foot exam Needs to do up-to-date A1c would be a good candidate for GLP-1 because of underlying health issues already on metformin  2. Hyperlipidemia associated with type 2 diabetes mellitus (HCC) Continue statin check lab work  3. Morbid obesity (HCC) Portion control regular physical activity  4. Chronic bilateral low back pain without sciatica The patient was seen in followup for chronic pain. A review over at their current pain status was discussed. Drug registry was checked. Prescriptions were given.  Regular follow-up recommended. Discussion was held regarding the importance of compliance with medication as well as pain medication contract.  Patient was informed that medication may cause drowsiness and should not be combined  with other medications/alcohol or street drugs. If the patient feels medication is causing altered alertness then do not drive or operate dangerous equipment.  Should be noted that the patient appears to be meeting appropriate use of opioids and response.  Evidenced by improved function and decent pain control without significant side effects and no evidence of overt aberrancy issues.  Upon discussion with the patient today they understand that opioid therapy is optional and they feel that the pain has been refractory to reasonable conservative measures and is significant and affecting quality of life enough to warrant ongoing therapy and  wishes to continue opioids.  Refills were provided.  [facility] medical Board guidelines regarding the pain medicine has been reviewed.  CDC guidelines most updated 2022 has been reviewed by the prescriber.  PDMP is checked on a regular basis yearly urine drug screen and pain management contract   5. Bilateral hip pain If osteoarthritis consider orthopedist If no osteoarthritis on x-ray her situation fits with lumbar stenosis and consideration for MRI would be reasonable or consideration of bumping up the dose of Lyrica and doing some physical therapy await the results of the x-ray - DG Pelvis 1-2 Views

## 2023-02-28 LAB — LIPID PANEL
Chol/HDL Ratio: 3 {ratio} (ref 0.0–4.4)
Cholesterol, Total: 157 mg/dL (ref 100–199)
HDL: 53 mg/dL (ref >39)
LDL Chol Calc (NIH): 70 mg/dL (ref 0–99)
Triglycerides: 204 mg/dL — ABNORMAL HIGH (ref 0–149)
VLDL Cholesterol Cal: 34 mg/dL (ref 5–40)

## 2023-02-28 LAB — KAPPA/LAMBDA LIGHT CHAINS
Ig Kappa Free Light Chain: 25 mg/L — ABNORMAL HIGH (ref 3.3–19.4)
Ig Lambda Free Light Chain: 25.1 mg/L (ref 5.7–26.3)
KAPPA/LAMBDA RATIO: 1 (ref 0.26–1.65)

## 2023-02-28 LAB — MICROALBUMIN / CREATININE URINE RATIO
Creatinine, Urine: 207.5 mg/dL
Microalb/Creat Ratio: 8 mg/g{creat} (ref 0–29)
Microalbumin, Urine: 16 ug/mL

## 2023-02-28 LAB — CBC WITH DIFFERENTIAL/PLATELET
Basophils Absolute: 0 10*3/uL (ref 0.0–0.2)
Basos: 0 %
EOS (ABSOLUTE): 0.3 10*3/uL (ref 0.0–0.4)
Eos: 3 %
Hematocrit: 39.8 % (ref 34.0–46.6)
Hemoglobin: 13.3 g/dL (ref 11.1–15.9)
Immature Grans (Abs): 0.1 10*3/uL (ref 0.0–0.1)
Immature Granulocytes: 1 %
Lymphocytes Absolute: 4 10*3/uL — ABNORMAL HIGH (ref 0.7–3.1)
Lymphs: 37 %
MCH: 29.4 pg (ref 26.6–33.0)
MCHC: 33.4 g/dL (ref 31.5–35.7)
MCV: 88 fL (ref 79–97)
Monocytes Absolute: 0.7 10*3/uL (ref 0.1–0.9)
Monocytes: 6 %
Neutrophils Absolute: 5.8 10*3/uL (ref 1.4–7.0)
Neutrophils: 53 %
Platelets: 184 10*3/uL (ref 150–450)
RBC: 4.53 x10E6/uL (ref 3.77–5.28)
RDW: 12.8 % (ref 11.7–15.4)
WBC: 10.8 10*3/uL (ref 3.4–10.8)

## 2023-02-28 LAB — BASIC METABOLIC PANEL
BUN/Creatinine Ratio: 16 (ref 12–28)
BUN: 11 mg/dL (ref 8–27)
CO2: 25 mmol/L (ref 20–29)
Calcium: 9.5 mg/dL (ref 8.7–10.3)
Chloride: 101 mmol/L (ref 96–106)
Creatinine, Ser: 0.68 mg/dL (ref 0.57–1.00)
Glucose: 129 mg/dL — ABNORMAL HIGH (ref 70–99)
Potassium: 4 mmol/L (ref 3.5–5.2)
Sodium: 143 mmol/L (ref 134–144)
eGFR: 96 mL/min/{1.73_m2} (ref >59)

## 2023-02-28 LAB — HEMOGLOBIN A1C
Est. average glucose Bld gHb Est-mCnc: 151 mg/dL
Hgb A1c MFr Bld: 6.9 % — ABNORMAL HIGH (ref 4.8–5.6)

## 2023-02-28 LAB — HEPATIC FUNCTION PANEL
ALT: 18 [IU]/L (ref 0–32)
AST: 20 [IU]/L (ref 0–40)
Albumin: 4.2 g/dL (ref 3.9–4.9)
Alkaline Phosphatase: 149 [IU]/L — ABNORMAL HIGH (ref 44–121)
Bilirubin Total: <0.2 mg/dL (ref 0.0–1.2)
Bilirubin, Direct: 0.11 mg/dL (ref 0.00–0.40)
Total Protein: 7.2 g/dL (ref 6.0–8.5)

## 2023-03-02 ENCOUNTER — Other Ambulatory Visit: Payer: Self-pay | Admitting: Family Medicine

## 2023-03-02 ENCOUNTER — Telehealth: Payer: Self-pay | Admitting: Family Medicine

## 2023-03-02 DIAGNOSIS — R899 Unspecified abnormal finding in specimens from other organs, systems and tissues: Secondary | ICD-10-CM

## 2023-03-02 NOTE — Telephone Encounter (Signed)
Nurses Patient had elevated kappa lambda free chain protein on her blood work  Please let patient know that I did communicate with hematology oncology they feel like this is most likely a normal variant-in other words normal for her  They do recommend the following testing to be thorough so therefore please go ahead and order this  24-hour urine with UPEP/  - Serum immunofixation  - Serum protein electrophoresis   She can do those at her convenience somewhere in the next couple weeks when we get the results back if they all come back looking normal no additional testing would be necessary although we would consider redoing these again in 1 years time  If abnormal we would consult with hematology oncology

## 2023-03-03 ENCOUNTER — Telehealth: Payer: Self-pay

## 2023-03-03 NOTE — Patient Outreach (Signed)
Attempted to contact patient regarding care gaps. Left voicemail for patient to return my call at [phone].  Nicholes Rough, CMA Care Guide [employer]

## 2023-03-04 ENCOUNTER — Other Ambulatory Visit: Payer: Self-pay | Admitting: Family Medicine

## 2023-03-04 ENCOUNTER — Ambulatory Visit (HOSPITAL_COMMUNITY)
Admission: RE | Admit: 2023-03-04 | Discharge: 2023-03-04 | Disposition: A | Discharge status: Home / Self Care | Payer: PPO | Source: Ambulatory Visit | Attending: Family Medicine | Admitting: Family Medicine

## 2023-03-04 ENCOUNTER — Ambulatory Visit: Payer: PPO | Admitting: Gastroenterology

## 2023-03-04 ENCOUNTER — Encounter: Payer: Self-pay | Admitting: Gastroenterology

## 2023-03-04 ENCOUNTER — Encounter: Payer: Self-pay | Admitting: Family Medicine

## 2023-03-04 VITALS — BP 138/72 | HR 66 | Temp 97.9°F | Ht 62.0 in | Wt 235.2 lb

## 2023-03-04 DIAGNOSIS — Z860100 Personal history of colon polyps, unspecified: Secondary | ICD-10-CM | POA: Diagnosis not present

## 2023-03-04 DIAGNOSIS — K219 Gastro-esophageal reflux disease without esophagitis: Secondary | ICD-10-CM

## 2023-03-04 DIAGNOSIS — K5900 Constipation, unspecified: Secondary | ICD-10-CM | POA: Diagnosis not present

## 2023-03-04 DIAGNOSIS — K589 Irritable bowel syndrome without diarrhea: Secondary | ICD-10-CM | POA: Insufficient documentation

## 2023-03-04 DIAGNOSIS — Z79891 Long term (current) use of opiate analgesic: Secondary | ICD-10-CM

## 2023-03-04 DIAGNOSIS — M25552 Pain in left hip: Secondary | ICD-10-CM | POA: Insufficient documentation

## 2023-03-04 DIAGNOSIS — M25551 Pain in right hip: Secondary | ICD-10-CM | POA: Insufficient documentation

## 2023-03-04 DIAGNOSIS — R109 Unspecified abdominal pain: Secondary | ICD-10-CM | POA: Diagnosis not present

## 2023-03-04 MED ORDER — HYDROCODONE-ACETAMINOPHEN 10-325 MG PO TABS: ORAL_TABLET | ORAL | 0 refills | Status: DC

## 2023-03-04 NOTE — Telephone Encounter (Signed)
May leave off the urine test

## 2023-03-04 NOTE — Progress Notes (Signed)
GI Office Note    Referring Provider: Babs Sciara, MD Primary Care Physician:  Babs Sciara, MD Primary Gastroenterologist: Hennie Duos. Marletta Lor, DO Date:  03/04/2023  ID:  Deborah Carroll, Deborah Carroll 12-22-1955, MRN [id number]   Chief Complaint   Chief Complaint  Patient presents with   Colonoscopy    Colonoscopy screening. Change in bowel habits.     History of Present Illness  Deborah Carroll is a 67 y.o. female with a history of seizures, OSA on CPAP, neuropathy, type 2 diabetes, HTN, obesity, anxiety/depression, and chronic pain presenting today to discuss her bowel habits and evaluation prior to colonoscopy.  Colonoscopy August 2020: -Two 10-11 mm polyps in the proximal transverse colon and proximal ascending colon -Three 3-4 mm polyps in the rectum and in the proximal ascending colon -External and internal hemorrhoids -Tortuous left colon -Advise high-fiber diet and weight loss -Pathology revealed 3 simple adenomas and 3 hyperplastic polyps -Repeat colonoscopy in 3 years  CT scan July 2021 with contrast: -Moderate colonic stool burden -Fluid distended stomach but otherwise unremarkable  EGD February 2022: -Gastritis s/p biopsy -Multiple gastric polyps s/p resection -Normal duodenum s/p biopsy -PPI twice daily -Advised to avoid NSAIDs -Biopsies benign -Biopsies negative for H. 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 Marletta Lor.  Patient reported ongoing diarrhea for 5 to 6 months.  She was taking dicyclomine which helps some as well as Imodium for relief every 1-2 weeks and it does stop her diarrhea.  Denied any melena or hematochezia.  Also noted to be s/p cholecystectomy therefore bile acid component also on differential.  Also advised given her age and gender microscopic colitis is also in differential.  Advised to start by increasing her dicyclomine to twice daily and more aggressive  use of Imodium and follow-up in 6 weeks.  If no improvement advised to consider repeat colonoscopy to rule out microscopic colitis and consider trial of colestipol.  STK 11 testing noted to be negative   Today: GERD: Pantoprazole does control her symptoms. Can tell if she misses it. Not regular nausea and vomiting. Has some stomach discomfort at times and some abdominal distention at times. Has not been able to determine if specific foods.   IBS: Was not going initially and then stools were hard. Dr. Gerda Diss put her on a medication for IBS then she went to having muddy type stools and then turn to water. Anytime before she does now she can feel the pain in her left abdomen goes down the middle of her abdomen. Not straining. Has a BM every other day and then goes a couple times prior. No melena or brbpr.  No right sided pain. Has been taking the bentyl twice daily, does not feel like it helps. Pain does improve after a BM.   Appetite is normal and good. Since she has been on Lyrica she has had weight gain.  No unintentional weight loss.  Does take pepto at times and has dark stools. Prior to all of this starting a few years ago she went weeks without a BM.   Had her gallbladder removed about 7-8 years ago.   No family history of colon cancer and polyps.   Uses torsemide as need for peripheral swelling.    Wt Readings from Last 3 Encounters:  03/04/23 235 lb 3.2 oz (106.7 kg)  02/26/23 236 lb 9.6 oz (107.3 kg)  12/13/22 225 lb (102.1  kg)    Current Outpatient Medications  Medication Sig Dispense Refill   dicyclomine (BENTYL) 10 MG capsule Take 1 capsule by mouth three times daily as needed 90 capsule 0   hydrochlorothiazide (HYDRODIURIL) 25 MG tablet 1 qam 90 tablet 3   HYDROcodone-acetaminophen (NORCO) 10-325 MG tablet Take 1 tab four times daily as needed for pain 120 tablet 0   HYDROcodone-acetaminophen (NORCO) 10-325 MG tablet 1 taken 4 times daily as needed for pain 120 tablet 0    HYDROcodone-acetaminophen (NORCO) 10-325 MG tablet 1 taken 4 times daily maximum per day as needed for pain 120 tablet 0   metFORMIN (GLUCOPHAGE-XR) 500 MG 24 hr tablet Take 1 tablet by mouth once daily 90 tablet 2   pantoprazole (PROTONIX) 40 MG tablet Take 1 tablet (40 mg total) by mouth daily. 90 tablet 3   phenytoin (DILANTIN) 100 MG ER capsule TAKE 2 CAPSULES BY MOUTH ONCE DAILY IN THE MORNING AND IN THE EVENING 360 capsule 0   potassium chloride SA (KLOR-CON M) 20 MEQ tablet Take 1 tablet (20 mEq total) by mouth 2 (two) times daily. 180 tablet 3   pregabalin (LYRICA) 25 MG capsule Take 1 capsule (25 mg total) by mouth 2 (two) times daily. 60 capsule 0   propranolol (INDERAL) 10 MG tablet Take 1 tablet by mouth twice daily 60 tablet 0   rosuvastatin (CRESTOR) 5 MG tablet TAKE 1 TABLET(5 MG) BY MOUTH DAILY 90 tablet 3   sertraline (ZOLOFT) 100 MG tablet TAKE 2 TABLETS BY MOUTH DAILY 180 tablet 3   torsemide (DEMADEX) 20 MG tablet Take 1 tablet (20 mg total) by mouth daily as needed (fluid or swelling). 30 tablet 2   No current facility-administered medications for this visit.    Past Medical History:  Diagnosis Date   Anxiety    Chronic pain    Depression    Diabetes mellitus without complication (HCC)    GERD (gastroesophageal reflux disease)    Hypertension    Morbid obesity (HCC) 03/20/2018   Neuropathy    Peripheral neuropathy    Peripheral neuropathy    Seizures (HCC)    last one 1992- Increased BP with delivery and is on meds still.   Sleep apnea    CPAP x 15 years    Past Surgical History:  Procedure Laterality Date   ABDOMINAL HYSTERECTOMY     ovaries present, no cancer,approx 2001   BACK SURGERY     BIOPSY  05/23/2020   Procedure: BIOPSY;  Surgeon: Lanelle Bal, DO;  Location: [facility];  Service: Endoscopy;;   BREAST BIOPSY Left 08/19/2006   Benign calcifications and sclerotic lobules   CARPAL TUNNEL RELEASE     Bilateral   CATARACT EXTRACTION W/PHACO  Right 06/20/2015   Procedure: CATARACT EXTRACTION PHACO AND INTRAOCULAR LENS PLACEMENT (IOC);  Surgeon: Jethro Bolus, MD;  Location: [facility];  Service: Ophthalmology;  Laterality: Right;  CDE:6.25   CATARACT EXTRACTION W/PHACO Left 08/29/2015   Procedure: CATARACT EXTRACTION PHACO AND INTRAOCULAR LENS PLACEMENT (IOC);  Surgeon: Jethro Bolus, MD;  Location: [facility];  Service: Ophthalmology;  Laterality: Left;  CDE: 4.11   CESAREAN SECTION     x2   CHOLECYSTECTOMY     COLONOSCOPY  10/14/2002   RUE:[id number] involving descending colon, splenic flexure and distal transverse colon with most extensive inflammation in the splenic flexure area.  These changes are typical of ischemic colitis.  Biopsy taken.   COLONOSCOPY N/A 08/04/2012   SLF: POLYPOID LESION IN AT  THE APPENDICEAL ORIFICE/Polyp  in the descending colon/Moderate melanosis throughout the entire examined colon/ The colon IS redundant. small tubular adenoma.    COLONOSCOPY WITH PROPOFOL N/A 11/17/2018   3 tubular adenomas removed, 3 hyperplastic polyps, next colonoscopy in 11/2021.    ESOPHAGOGASTRODUODENOSCOPY (EGD) WITH PROPOFOL N/A 05/23/2020   Procedure: ESOPHAGOGASTRODUODENOSCOPY (EGD) WITH PROPOFOL;  Surgeon: Lanelle Bal, DO;  Location: [facility];  Service: Endoscopy;  Laterality: N/A;  11:45am   FOOT SURGERY Bilateral 2004   hammer toe repairs   PARS PLANA VITRECTOMY W/ REPAIR OF MACULAR HOLE     POLYPECTOMY  11/17/2018   Procedure: POLYPECTOMY;  Surgeon: West Bali, MD;  Location: [facility];  Service: Endoscopy;;   POLYPECTOMY  05/23/2020   Procedure: POLYPECTOMY;  Surgeon: Lanelle Bal, DO;  Location: [facility];  Service: Endoscopy;;    Family History  Problem Relation Age of Onset   Hypertension Maternal Grandmother    Heart attack Maternal Grandfather    Emphysema Father    Asthma Father    Colon polyps Mother 47       benign    Colon cancer Neg Hx    Liver disease Neg Hx     Allergies as of  03/04/2023   (No Known Allergies)    Social History   Socioeconomic History   Marital status: Married    Spouse name: Not on file   Number of children: 2   Years of education: 12   Highest education level: 12th grade  Occupational History   Occupation: disability  Tobacco Use   Smoking status: Never   Smokeless tobacco: Never   Tobacco comments:    smoked x 4 years many years ago  Vaping Use   Vaping status: Never Used  Substance and Sexual Activity   Alcohol use: Not Currently   Drug use: No   Sexual activity: Yes    Birth control/protection: Surgical    Comment: hyst  Other Topics Concern   Not on file  Social History Narrative   lives with husband   Social Determinants of Health   Financial Resource Strain: Low Risk  (02/26/2023)   Overall Financial Resource Strain (CARDIA)    Difficulty of Paying Living Expenses: Not hard at all  Food Insecurity: No Food Insecurity (02/26/2023)   Hunger Vital Sign    Worried About Running Out of Food in the Last Year: Never true    Ran Out of Food in the Last Year: Never true  Transportation Needs: No Transportation Needs (02/26/2023)   PRAPARE - Administrator, Civil Service (Medical): No    Lack of Transportation (Non-Medical): No  Physical Activity: Insufficiently Active (02/26/2023)   Exercise Vital Sign    Days of Exercise per Week: 1 day    Minutes of Exercise per Session: 10 min  Stress: No Stress Concern Present (02/26/2023)   [employer] of Occupational Health - Occupational Stress Questionnaire    Feeling of Stress : Only a little  Recent Concern: Stress - Stress Concern Present (12/13/2022)   [employer] of Occupational Health - Occupational Stress Questionnaire    Feeling of Stress : To some extent  Social Connections: Socially Integrated (02/26/2023)   Social Connection and Isolation Panel [NHANES]    Frequency of Communication with Friends and Family: More than three times a week     Frequency of Social Gatherings with Friends and Family: Three times a week    Attends Religious Services: 1 to  4 times per year    Active Member of Clubs or Organizations: Yes    Attends Banker Meetings: 1 to 4 times per year    Marital Status: Married     Review of Systems   Gen: Denies fever, chills, anorexia. Denies fatigue, weakness, weight loss.  CV: Denies chest pain, palpitations, syncope, peripheral edema, and claudication. Resp: Denies dyspnea at rest, cough, wheezing, coughing up blood, and pleurisy. GI: See HPI Derm: Denies rash, itching, dry skin Psych: Denies depression, anxiety, memory loss, confusion. No homicidal or suicidal ideation.  Heme: Denies bruising, bleeding, and enlarged lymph nodes.  Physical Exam   BP 138/72 (BP Location: Left Arm, Patient Position: Sitting, Cuff Size: Large)   Pulse 66   Temp 97.9 F (36.6 C) (Temporal)   Ht 5\' 2"  (1.575 m)   Wt 235 lb 3.2 oz (106.7 kg)   BMI 43.02 kg/m   General:   Alert and oriented. No distress noted. Pleasant and cooperative.  Head:  Normocephalic and atraumatic. Eyes:  Conjuctiva clear without scleral icterus. Mouth:  Oral mucosa pink and moist. Good dentition. No lesions. Lungs:  Clear to auscultation bilaterally. No wheezes, rales, or rhonchi. No distress.  Heart:  S1, S2 present without murmurs appreciated.  Abdomen:  +BS, soft, non-tender and non-distended. No rebound or guarding. No HSM or masses noted. Rectal: deferred Msk:  Symmetrical without gross deformities. Normal posture. Extremities:  Without edema. Neurologic:  Alert and  oriented x4 Psych:  Alert and cooperative. Normal mood and affect.  Assessment  Deborah Carroll is a 67 y.o. female with a history of seizures, OSA on CPAP, neuropathy, type 2 diabetes, HTN, obesity, anxiety/depression, and chronic pain presenting today to discuss her bowel habits and evaluation prior to colonoscopy.  IBS: Previously suspected to have  primarily diarrhea predominant IBS however in the past she has had incidence of no bowel movement in 3 weeks and it was only after all of this that she began having looser stools.  Typical bowel movement every other day and will have multiple that day.  Denies any straining.  Does have left lower quadrant abdominal pain that occurs prior to bowel movements and improves afterwards.  I suspect she may have more constipation than diarrhea therefore I will obtain an abdominal x-ray and if it shows a stool burden I will treat with likely Linzess or Trulance to help with abdominal pain.  For now we will continue dicyclomine twice daily.  She is due for surveillance of colon polyps therefore we are proceeding with a colonoscopy.  If no evidence of constipation on KUB, we could consider random biopsies for evaluation of microscopic colitis.  GERD: Well-controlled on pantoprazole 40 mg once daily.  Denies any nausea, vomiting, or dysphagia.  History of colon polyps: History of simple adenomas and hyperplastic polyps removed in August 2020.  Currently overdue for surveillance given she was recommended to have repeat in 3 years.  PLAN   KUB Proceed with colonoscopy (possible random biopsies) with propofol by Dr. Marletta Lor  in near future: the risks, benefits, and alternatives have been discussed with the patient in detail. The patient states understanding and desires to proceed. ASA 3 Dulcolax daily for 3 days prior Trilte prep Hold metformin morning of procedure. Continue pantoprazole 40 mg once daily Will determine treatment for constipation versus diarrhea after receiving results of abdominal x-ray For now we will continue dicyclomine twice a day to help with abdominal discomfort Advise high-fiber diet, low FODMAP diet.  Separate written education provided. Encouraged to food/symptom log. Follow up post colonoscopy  Brooke Bonito, MSN, FNP-BC, AGACNP-BC [facility]

## 2023-03-04 NOTE — Patient Instructions (Signed)
We will get you scheduled for a colonoscopy in the near future with Dr. Marletta Lor.  For now continue taking pantoprazole 40 mg once daily for your reflux.  I have ordered an abdominal x-ray for you to complete at the hospital at your earliest convenience.  This will help me better discern if we are treating constipation or if we need to better treat diarrhea.  You can continue to take dicyclomine twice daily for now.  I do encourage you to complete a food and symptom log to better discern if any certain types of foods cause more urgency or bloating/distention of your stomach.   I hope you have a wonderful Thanksgiving and Christmas!  It was a pleasure to see you today. I want to create trusting relationships with patients. If you receive a survey regarding your visit,  I greatly appreciate you taking time to fill this out on paper or through your MyChart. I value your feedback.  Brooke Bonito, MSN, FNP-BC, AGACNP-BC [facility]

## 2023-03-05 ENCOUNTER — Other Ambulatory Visit: Payer: Self-pay | Admitting: Family Medicine

## 2023-03-05 ENCOUNTER — Other Ambulatory Visit: Payer: Self-pay

## 2023-03-10 ENCOUNTER — Encounter: Payer: Self-pay | Admitting: *Deleted

## 2023-03-10 NOTE — Telephone Encounter (Signed)
Lab work ordered in [employer]. Patient notified of provider's recommendations via my chart

## 2023-03-17 ENCOUNTER — Other Ambulatory Visit: Payer: Self-pay | Admitting: Family Medicine

## 2023-03-17 ENCOUNTER — Telehealth: Payer: Self-pay | Admitting: *Deleted

## 2023-03-17 MED ORDER — PEG 3350-KCL-NA BICARB-NACL 420 G PO SOLR: 4000.0000 mL | Freq: Once | ORAL | 0 refills | Status: AC

## 2023-03-17 NOTE — Telephone Encounter (Signed)
Called pt. She has been scheduled for TCS with Dr. Marletta Lor ASA 3 on 1/31 at 7:30am. Aware will send instructions to her. Will call back with pre-op appt. Rx for prep to be sent to pharmacy. Insurance will be same for 2025

## 2023-03-18 ENCOUNTER — Telehealth: Payer: Self-pay

## 2023-03-18 NOTE — Telephone Encounter (Addendum)
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  It would be reasonable to have a consultation with orthopedics if she is open to doing so.  May go ahead with referral to orthopedics of her choice.   --- message left for a return call for details---

## 2023-03-20 ENCOUNTER — Other Ambulatory Visit: Payer: Self-pay

## 2023-03-20 ENCOUNTER — Other Ambulatory Visit: Payer: Self-pay | Admitting: Family Medicine

## 2023-03-20 DIAGNOSIS — M25552 Pain in left hip: Secondary | ICD-10-CM

## 2023-04-04 ENCOUNTER — Ambulatory Visit: Payer: PPO | Admitting: Orthopedic Surgery

## 2023-04-04 ENCOUNTER — Other Ambulatory Visit: Payer: Self-pay | Admitting: Family Medicine

## 2023-04-15 ENCOUNTER — Other Ambulatory Visit: Payer: Self-pay | Admitting: Family Medicine

## 2023-04-20 ENCOUNTER — Other Ambulatory Visit: Payer: Self-pay | Admitting: Family Medicine

## 2023-05-02 NOTE — Patient Instructions (Signed)
SAFAA STINGLEY  05/02/2023     @PREFPERIOPPHARMACY @   Your procedure is scheduled on  05/09/2023.   Report to [facility] at  0600  A.M.   Call this number if you have problems the morning of surgery:  [phone]  If you experience any cold or flu symptoms such as cough, fever, chills, shortness of breath, etc. between now and your scheduled surgery, please notify us at the above number.   Remember:        DO NOT take any medications for diabetes the morning of your procedure.    Follow the diet and prep instructions given to you by the office.   You may drink clear liquids until 0330 am on 05/09/2023.    Clear liquids allowed are:                    Water, Juice (No red color; non-citric and without pulp; diabetics please choose diet or no sugar options), Carbonated beverages (diabetics please choose diet or no sugar options), Clear Tea (No creamer, milk, or cream, including half & half and powdered creamer), Black Coffee Only (No creamer, milk or cream, including half & half and powdered creamer), and Clear Sports drink (No red color; diabetics please choose diet or no sugar options)    Take these medicines the morning of surgery with A SIP OF WATER         hydrocodone(if needed), pantoprazole, phenytoin, pregabalin, propranolol, sertraline.     Do not wear jewelry, make-up or nail polish, including gel polish,  artificial nails, or any other type of covering on natural nails (fingers and  toes).  Do not wear lotions, powders, or perfumes, or deodorant.  Do not shave 48 hours prior to surgery.  Men may shave face and neck.  Do not bring valuables to the hospital.  [facility] is not responsible for any belongings or valuables.  Contacts, dentures or bridgework may not be worn into surgery.  Leave your suitcase in the car.  After surgery it may be brought to your room.  For patients admitted to the hospital, discharge time will be determined by your treatment  team.  Patients discharged the day of surgery will not be allowed to drive home and must have someone with them for 24 hours.    Special instructions:   DO NOT smoke tobacco or vape for 24 hours before your procedure.  Please read over the following fact sheets that you were given. Anesthesia Post-op Instructions and Care and Recovery After Surgery      Colonoscopy, Adult, Care After The following information offers guidance on how to care for yourself after your procedure. Your health care provider may also give you more specific instructions. If you have problems or questions, contact your health care provider. What can I expect after the procedure? After the procedure, it is common to have: A small amount of blood in your stool for 24 hours after the procedure. Some gas. Mild cramping or bloating of your abdomen. Follow these instructions at home: Eating and drinking  Drink enough fluid to keep your urine pale yellow. Follow instructions from your health care provider about eating or drinking restrictions. Resume your normal diet as told by your health care provider. Avoid heavy or fried foods that are hard to digest. Activity Rest as told by your health care provider. Avoid sitting for a long time without moving. Get up to take short walks every  1-2 hours. This is important to improve blood flow and breathing. Ask for help if you feel weak or unsteady. Return to your normal activities as told by your health care provider. Ask your health care provider what activities are safe for you. Managing cramping and bloating  Try walking around when you have cramps or feel bloated. If directed, apply heat to your abdomen as told by your health care provider. Use the heat source that your health care provider recommends, such as a moist heat pack or a heating pad. Place a towel between your skin and the heat source. Leave the heat on for 20-30 minutes. Remove the heat if your skin turns  bright red. This is especially important if you are unable to feel pain, heat, or cold. You have a greater risk of getting burned. General instructions If you were given a sedative during the procedure, it can affect you for several hours. Do not drive or operate machinery until your health care provider says that it is safe. For the first 24 hours after the procedure: Do not sign important documents. Do not drink alcohol. Do your regular daily activities at a slower pace than normal. Eat soft foods that are easy to digest. Take over-the-counter and prescription medicines only as told by your health care provider. Keep all follow-up visits. This is important. Contact a health care provider if: You have blood in your stool 2-3 days after the procedure. Get help right away if: You have more than a small spotting of blood in your stool. You have large blood clots in your stool. You have swelling of your abdomen. You have nausea or vomiting. You have a fever. You have increasing pain in your abdomen that is not relieved with medicine. These symptoms may be an emergency. Get help right away. Call 911. Do not wait to see if the symptoms will go away. Do not drive yourself to the hospital. Summary After the procedure, it is common to have a small amount of blood in your stool. You may also have mild cramping and bloating of your abdomen. If you were given a sedative during the procedure, it can affect you for several hours. Do not drive or operate machinery until your health care provider says that it is safe. Get help right away if you have a lot of blood in your stool, nausea or vomiting, a fever, or increased pain in your abdomen. This information is not intended to replace advice given to you by your health care provider. Make sure you discuss any questions you have with your health care provider. Document Revised: 05/07/2022 Document Reviewed: 11/15/2020 Elsevier Patient Education  2024  [employer]vier [employer]Monitored Anesthesia Care, Care After The following information offers guidance on how to care for yourself after your procedure. Your health care provider may also give you more specific instructions. If you have problems or questions, contact your health care provider. What can I expect after the procedure? After the procedure, it is common to have: Tiredness. Little or no memory about what happened during or after the procedure. Impaired judgment when it comes to making decisions. Nausea or vomiting. Some trouble with balance. Follow these instructions at home: For the time period you were told by your health care provider:  Rest. Do not participate in activities where you could fall or become injured. Do not drive or use machinery. Do not drink alcohol. Do not take sleeping pills or medicines that cause drowsiness. Do not make important decisions or sign  legal documents. Do not take care of children on your own. Medicines Take over-the-counter and prescription medicines only as told by your health care provider. If you were prescribed antibiotics, take them as told by your health care provider. Do not stop using the antibiotic even if you start to feel better. Eating and drinking Follow instructions from your health care provider about what you may eat and drink. Drink enough fluid to keep your urine pale yellow. If you vomit: Drink clear fluids slowly and in small amounts as you are able. Clear fluids include water, ice chips, low-calorie sports drinks, and fruit juice that has water added to it (diluted fruit juice). Eat light and bland foods in small amounts as you are able. These foods include bananas, applesauce, rice, lean meats, toast, and crackers. General instructions  Have a responsible adult stay with you for the time you are told. It is important to have someone help care for you until you are awake and alert. If you have sleep apnea, surgery and some  medicines can increase your risk for breathing problems. Follow instructions from your health care provider about wearing your sleep device: When you are sleeping. This includes during daytime naps. While taking prescription pain medicines, sleeping medicines, or medicines that make you drowsy. Do not use any products that contain nicotine or tobacco. These products include cigarettes, chewing tobacco, and vaping devices, such as e-cigarettes. If you need help quitting, ask your health care provider. Contact a health care provider if: You feel nauseous or vomit every time you eat or drink. You feel light-headed. You are still sleepy or having trouble with balance after 24 hours. You get a rash. You have a fever. You have redness or swelling around the IV site. Get help right away if: You have trouble breathing. You have new confusion after you get home. These symptoms may be an emergency. Get help right away. Call 911. Do not wait to see if the symptoms will go away. Do not drive yourself to the hospital. This information is not intended to replace advice given to you by your health care provider. Make sure you discuss any questions you have with your health care provider. Document Revised: 08/20/2021 Document Reviewed: 08/20/2021 Elsevier Patient Education  2024 [employer].

## 2023-05-03 ENCOUNTER — Other Ambulatory Visit: Payer: Self-pay | Admitting: Family Medicine

## 2023-05-04 ENCOUNTER — Other Ambulatory Visit: Payer: Self-pay | Admitting: Family Medicine

## 2023-05-04 DIAGNOSIS — E1169 Type 2 diabetes mellitus with other specified complication: Secondary | ICD-10-CM

## 2023-05-05 ENCOUNTER — Encounter (HOSPITAL_COMMUNITY): Payer: Self-pay

## 2023-05-05 ENCOUNTER — Encounter (HOSPITAL_COMMUNITY)
Admission: RE | Admit: 2023-05-05 | Discharge: 2023-05-05 | Disposition: A | Discharge status: Home / Self Care | Payer: PPO | Source: Ambulatory Visit | Attending: Internal Medicine | Admitting: Internal Medicine

## 2023-05-05 ENCOUNTER — Other Ambulatory Visit: Payer: Self-pay

## 2023-05-05 DIAGNOSIS — Z01810 Encounter for preprocedural cardiovascular examination: Secondary | ICD-10-CM | POA: Diagnosis not present

## 2023-05-05 DIAGNOSIS — I440 Atrioventricular block, first degree: Secondary | ICD-10-CM | POA: Diagnosis not present

## 2023-05-05 DIAGNOSIS — E119 Type 2 diabetes mellitus without complications: Secondary | ICD-10-CM | POA: Diagnosis not present

## 2023-05-05 MED ORDER — DICYCLOMINE HCL 10 MG PO CAPS: ORAL_CAPSULE | ORAL | 0 refills | Status: DC

## 2023-05-09 ENCOUNTER — Ambulatory Visit (HOSPITAL_COMMUNITY): Payer: PPO | Admitting: Certified Registered Nurse Anesthetist

## 2023-05-09 ENCOUNTER — Encounter (HOSPITAL_COMMUNITY)
Admission: RE | Disposition: A | Discharge status: Home / Self Care | Payer: Self-pay | Source: Home / Self Care | Attending: Internal Medicine

## 2023-05-09 ENCOUNTER — Encounter (HOSPITAL_COMMUNITY): Payer: Self-pay | Admitting: Internal Medicine

## 2023-05-09 ENCOUNTER — Ambulatory Visit (HOSPITAL_COMMUNITY)
Admission: RE | Admit: 2023-05-09 | Discharge: 2023-05-09 | Disposition: A | Discharge status: Home / Self Care | Payer: PPO | Attending: Internal Medicine | Admitting: Internal Medicine

## 2023-05-09 DIAGNOSIS — D120 Benign neoplasm of cecum: Secondary | ICD-10-CM | POA: Insufficient documentation

## 2023-05-09 DIAGNOSIS — D122 Benign neoplasm of ascending colon: Secondary | ICD-10-CM

## 2023-05-09 DIAGNOSIS — D123 Benign neoplasm of transverse colon: Secondary | ICD-10-CM | POA: Diagnosis not present

## 2023-05-09 DIAGNOSIS — K5730 Diverticulosis of large intestine without perforation or abscess without bleeding: Secondary | ICD-10-CM | POA: Diagnosis not present

## 2023-05-09 DIAGNOSIS — Z860100 Personal history of colon polyps, unspecified: Secondary | ICD-10-CM | POA: Diagnosis not present

## 2023-05-09 DIAGNOSIS — I10 Essential (primary) hypertension: Secondary | ICD-10-CM | POA: Diagnosis not present

## 2023-05-09 DIAGNOSIS — Z1211 Encounter for screening for malignant neoplasm of colon: Secondary | ICD-10-CM | POA: Insufficient documentation

## 2023-05-09 DIAGNOSIS — E119 Type 2 diabetes mellitus without complications: Secondary | ICD-10-CM | POA: Diagnosis not present

## 2023-05-09 DIAGNOSIS — K648 Other hemorrhoids: Secondary | ICD-10-CM | POA: Diagnosis not present

## 2023-05-09 DIAGNOSIS — G4730 Sleep apnea, unspecified: Secondary | ICD-10-CM | POA: Diagnosis not present

## 2023-05-09 DIAGNOSIS — Z7984 Long term (current) use of oral hypoglycemic drugs: Secondary | ICD-10-CM | POA: Insufficient documentation

## 2023-05-09 DIAGNOSIS — K635 Polyp of colon: Secondary | ICD-10-CM | POA: Diagnosis not present

## 2023-05-09 HISTORY — PX: COLONOSCOPY WITH PROPOFOL: SHX5780

## 2023-05-09 HISTORY — PX: POLYPECTOMY: SHX149

## 2023-05-09 LAB — GLUCOSE, CAPILLARY: Glucose-Capillary: 126 mg/dL — ABNORMAL HIGH (ref 70–99)

## 2023-05-09 SURGERY — COLONOSCOPY WITH PROPOFOL
Anesthesia: General

## 2023-05-09 MED ORDER — LACTATED RINGERS IV SOLN: INTRAVENOUS | Status: DC | PRN

## 2023-05-09 MED ORDER — PROPOFOL 500 MG/50ML IV EMUL
INTRAVENOUS | Status: DC | PRN
  Administered 2023-05-09: 150 ug/kg/min via INTRAVENOUS
  Administered 2023-05-09: 20 mg via INTRAVENOUS
  Administered 2023-05-09: 80 mg via INTRAVENOUS

## 2023-05-09 NOTE — H&P (Signed)
Primary Care Physician:  Babs Sciara, MD Primary Gastroenterologist:  Dr. Marletta Lor  Pre-Procedure History & Physical: HPI:  Deborah Carroll is a 68 y.o. female is here for a colonoscopy to be performed for surveillance purposes, personal history of adenomatous colon polyps in 2020  Past Medical History:  Diagnosis Date   Anxiety    Chronic pain    Depression    Diabetes mellitus without complication (HCC)    GERD (gastroesophageal reflux disease)    Hypertension    Morbid obesity (HCC) 03/20/2018   Neuropathy    Peripheral neuropathy    Peripheral neuropathy    Seizures (HCC)    last one 1992- Increased BP with delivery and is on meds still.   Sleep apnea    CPAP x 15 years    Past Surgical History:  Procedure Laterality Date   ABDOMINAL HYSTERECTOMY     ovaries present, no cancer,approx 2001   BACK SURGERY     BIOPSY  05/23/2020   Procedure: BIOPSY;  Surgeon: Lanelle Bal, DO;  Location: [facility];  Service: Endoscopy;;   BREAST BIOPSY Left 08/19/2006   Benign calcifications and sclerotic lobules   CARPAL TUNNEL RELEASE     Bilateral   CATARACT EXTRACTION W/PHACO Right 06/20/2015   Procedure: CATARACT EXTRACTION PHACO AND INTRAOCULAR LENS PLACEMENT (IOC);  Surgeon: Jethro Bolus, MD;  Location: [facility];  Service: Ophthalmology;  Laterality: Right;  CDE:6.25   CATARACT EXTRACTION W/PHACO Left 08/29/2015   Procedure: CATARACT EXTRACTION PHACO AND INTRAOCULAR LENS PLACEMENT (IOC);  Surgeon: Jethro Bolus, MD;  Location: [facility];  Service: Ophthalmology;  Laterality: Left;  CDE: 4.11   CESAREAN SECTION     x2   CHOLECYSTECTOMY     COLONOSCOPY  10/14/2002   [id number] involving descending colon, splenic flexure and distal transverse colon with most extensive inflammation in the splenic flexure area.  These changes are typical of ischemic colitis.  Biopsy taken.   COLONOSCOPY N/A 08/04/2012   SLF: POLYPOID LESION IN AT THE APPENDICEAL ORIFICE/Polyp  in the descending  colon/Moderate melanosis throughout the entire examined colon/ The colon IS redundant. small tubular adenoma.    COLONOSCOPY WITH PROPOFOL N/A 11/17/2018   3 tubular adenomas removed, 3 hyperplastic polyps, next colonoscopy in 11/2021.    ESOPHAGOGASTRODUODENOSCOPY (EGD) WITH PROPOFOL N/A 05/23/2020   Procedure: ESOPHAGOGASTRODUODENOSCOPY (EGD) WITH PROPOFOL;  Surgeon: Lanelle Bal, DO;  Location: [facility];  Service: Endoscopy;  Laterality: N/A;  11:45am   FOOT SURGERY Bilateral 2004   hammer toe repairs   PARS PLANA VITRECTOMY W/ REPAIR OF MACULAR HOLE     POLYPECTOMY  11/17/2018   Procedure: POLYPECTOMY;  Surgeon: West Bali, MD;  Location: [facility];  Service: Endoscopy;;   POLYPECTOMY  05/23/2020   Procedure: POLYPECTOMY;  Surgeon: Lanelle Bal, DO;  Location: [facility];  Service: Endoscopy;;    Prior to Admission medications   Medication Sig Start Date End Date Taking? Authorizing Provider  dicyclomine (BENTYL) 10 MG capsule Take 1 capsule by mouth three times daily as needed 05/05/23  Yes Luking, Jonna Coup, MD  hydrochlorothiazide (HYDRODIURIL) 25 MG tablet Take 1 tablet by mouth once daily 04/04/23  Yes Luking, Jonna Coup, MD  HYDROcodone-acetaminophen (NORCO) 10-325 MG tablet 1 taken 4 times daily as needed for pain 03/04/23  Yes Luking, Scott A, MD  metFORMIN (GLUCOPHAGE-XR) 500 MG 24 hr tablet Take 1 tablet by mouth once daily 10/21/22  Yes Luking, Jonna Coup, MD  pantoprazole (PROTONIX)  40 MG tablet Take 1 tablet by mouth once daily 03/17/23  Yes Luking, Scott A, MD  phenytoin (DILANTIN) 100 MG ER capsule TAKE 2 CAPSULES BY MOUTH TWICE DAILY - IN THE MORNING AND IN THE EVENING 03/05/23  Yes Luking, Scott A, MD  potassium chloride SA (KLOR-CON M) 20 MEQ tablet Take 1 tablet (20 mEq total) by mouth 2 (two) times daily. 04/23/22  Yes Babs Sciara, MD  pregabalin (LYRICA) 25 MG capsule Take 1 capsule (25 mg total) by mouth 2 (two) times daily. 02/24/23  Yes Babs Sciara, MD  propranolol (INDERAL) 10 MG tablet Take 1 tablet by mouth twice daily 04/21/23  Yes Luking, Jonna Coup, MD  rosuvastatin (CRESTOR) 5 MG tablet Take 1 tablet by mouth once daily 05/05/23  Yes Luking, Jonna Coup, MD  sertraline (ZOLOFT) 100 MG tablet Take 2 tablets by mouth once daily 04/15/23  Yes Luking, Jonna Coup, MD  torsemide (DEMADEX) 20 MG tablet Take 1 tablet (20 mg total) by mouth daily as needed (fluid or swelling). 04/23/22  Yes Babs Sciara, MD  HYDROcodone-acetaminophen (NORCO) 10-325 MG tablet 1 taken 4 times daily maximum per day as needed for pain 03/04/23   Babs Sciara, MD  HYDROcodone-acetaminophen [facility]) 10-325 MG tablet Take 1 tab four times daily as needed for pain 03/04/23   Babs Sciara, MD  Potassium Chloride ER 20 MEQ TBCR Take 1 tablet by mouth 2 (two) times daily.    [provider]    Allergies as of 03/17/2023   (No Known Allergies)    Family History  Problem Relation Age of Onset   Hypertension Maternal Grandmother    Heart attack Maternal Grandfather    Emphysema Father    Asthma Father    Colon polyps Mother 97       benign    Colon cancer Neg Hx    Liver disease Neg Hx     Social History   Socioeconomic History   Marital status: Married    Spouse name: Not on file   Number of children: 2   Years of education: 12   Highest education level: 12th grade  Occupational History   Occupation: disability  Tobacco Use   Smoking status: Never   Smokeless tobacco: Never   Tobacco comments:    smoked x 4 years many years ago  Vaping Use   Vaping status: Never Used  Substance and Sexual Activity   Alcohol use: Not Currently   Drug use: No   Sexual activity: Yes    Birth control/protection: Surgical    Comment: hyst  Other Topics Concern   Not on file  Social History Narrative   lives with husband   Social Drivers of Health   Financial Resource Strain: Low Risk  (02/26/2023)   Overall Financial Resource Strain (CARDIA)     Difficulty of Paying Living Expenses: Not hard at all  Food Insecurity: No Food Insecurity (02/26/2023)   Hunger Vital Sign    Worried About Running Out of Food in the Last Year: Never true    Ran Out of Food in the Last Year: Never true  Transportation Needs: No Transportation Needs (02/26/2023)   PRAPARE - Administrator, Civil Service (Medical): No    Lack of Transportation (Non-Medical): No  Physical Activity: Insufficiently Active (02/26/2023)   Exercise Vital Sign    Days of Exercise per Week: 1 day    Minutes of Exercise per Session: 10 min  Stress: No Stress Concern Present (02/26/2023)   [employer] of Occupational Health - Occupational Stress Questionnaire    Feeling of Stress : Only a little  Recent Concern: Stress - Stress Concern Present (12/13/2022)   [employer] of Occupational Health - Occupational Stress Questionnaire    Feeling of Stress : To some extent  Social Connections: Socially Integrated (02/26/2023)   Social Connection and Isolation Panel [NHANES]    Frequency of Communication with Friends and Family: More than three times a week    Frequency of Social Gatherings with Friends and Family: Three times a week    Attends Religious Services: 1 to 4 times per year    Active Member of Clubs or Organizations: Yes    Attends Banker Meetings: 1 to 4 times per year    Marital Status: Married  Catering manager Violence: Not At Risk (12/13/2022)   Humiliation, Afraid, Rape, and Kick questionnaire    Fear of Current or Ex-Partner: No    Emotionally Abused: No    Physically Abused: No    Sexually Abused: No    Review of Systems: See HPI, otherwise negative ROS  Physical Exam: Vital signs in last 24 hours: Temp:  [98.2 F (36.8 C)] 98.2 F (36.8 C) (01/31 0639) Pulse Rate:  [58] 58 (01/31 0639) Resp:  [18] 18 (01/31 0639) BP: (136)/(72) 136/72 (01/31 0639) SpO2:  [96 %] 96 % (01/31 0639)   General:   Alert,  Well-developed,  well-nourished, pleasant and cooperative in NAD Head:  Normocephalic and atraumatic. Eyes:  Sclera clear, no icterus.   Conjunctiva pink. Ears:  Normal auditory acuity. Nose:  No deformity, discharge,  or lesions. Msk:  Symmetrical without gross deformities. Normal posture. Extremities:  Without clubbing or edema. Neurologic:  Alert and  oriented x4;  grossly normal neurologically. Skin:  Intact without significant lesions or rashes. Psych:  Alert and cooperative. Normal mood and affect.  Impression/Plan: Deborah Carroll is here for a colonoscopy to be performed for surveillance purposes, personal history of adenomatous colon polyps in 2020  The risks of the procedure including infection, bleed, or perforation as well as benefits, limitations, alternatives and imponderables have been reviewed with the patient. Questions have been answered. All parties agreeable.

## 2023-05-09 NOTE — Anesthesia Postprocedure Evaluation (Signed)
Anesthesia Post Note  Patient: Deborah Carroll  Procedure(s) Performed: COLONOSCOPY WITH PROPOFOL POLYPECTOMY INTESTINAL  Patient location during evaluation: Phase II Anesthesia Type: General Level of consciousness: awake Pain management: pain level controlled Vital Signs Assessment: post-procedure vital signs reviewed and stable Respiratory status: spontaneous breathing and respiratory function stable Cardiovascular status: blood pressure returned to baseline and stable Postop Assessment: no headache and no apparent nausea or vomiting Anesthetic complications: no Comments: Late entry   No notable events documented.   Last Vitals:  Vitals:   05/09/23 0639 05/09/23 0756  BP: 136/72 (!) 109/57  Pulse: (!) 58 (!) 57  Resp: 18 15  Temp: 36.8 C 36.5 C  SpO2: 96% 97%    Last Pain:  Vitals:   05/09/23 0756  TempSrc: Oral  PainSc: 0-No pain                 Windell Norfolk

## 2023-05-09 NOTE — Op Note (Signed)
[facility] Patient Name: Deborah Carroll Procedure Date: 05/09/2023 7:02 AM MRN: [id number] Date of Birth: Aug 12, 1955 Attending MD: Hennie Duos. Marletta Lor , [place], [id number] CSN: [id number] Age: 68 Admit Type: Outpatient Procedure:                Colonoscopy Indications:              Surveillance: Personal history of adenomatous                            polyps on last colonoscopy 5 years ago Providers:                Hennie Duos. Marletta Lor, DO, Crystal Page, Dyann Ruddle Referring MD:              Medicines:                See the Anesthesia note for documentation of the                            administered medications Complications:            No immediate complications. Estimated Blood Loss:     Estimated blood loss was minimal. Procedure:                Pre-Anesthesia Assessment:                           - The anesthesia plan was to use monitored                            anesthesia care (MAC).                           After obtaining informed consent, the colonoscope                            was passed under direct vision. Throughout the                            procedure, the patient's blood pressure, pulse, and                            oxygen saturations were monitored continuously. The                            [id number]-[id number] ([id number]) scope was introduced through                            the anus and advanced to the the cecum, identified                            by appendiceal orifice and ileocecal valve. The                            colonoscopy was performed without difficulty. The  patient tolerated the procedure well. The quality                            of the bowel preparation was evaluated using the                            BBPS [facility] Bowel Preparation Scale) with scores                            of: Right Colon = 3, Transverse Colon = 3 and Left                            Colon = 3 (entire mucosa seen well with no residual                             staining, small fragments of stool or opaque                            liquid). The total BBPS score equals 9. Scope In: 7:34:46 AM Scope Out: 7:53:48 AM Scope Withdrawal Time: 0 hours 15 minutes 34 seconds  Total Procedure Duration: 0 hours 19 minutes 2 seconds  Findings:      Non-bleeding internal hemorrhoids were found during endoscopy.      Multiple medium-mouthed and small-mouthed diverticula were found in the       sigmoid colon and descending colon.      Three sessile polyps were found in the ascending colon and cecum. The       polyps were 4 to 6 mm in size. These polyps were removed with a cold       snare. Resection and retrieval were complete.      Two sessile polyps were found in the transverse colon. The polyps were 7       to 9 mm in size. These polyps were removed with a cold snare. Resection       and retrieval were complete.      The exam was otherwise without abnormality. Impression:               - Non-bleeding internal hemorrhoids.                           - Diverticulosis in the sigmoid colon and in the                            descending colon.                           - Three 4 to 6 mm polyps in the ascending colon and                            in the cecum, removed with a cold snare. Resected                            and retrieved.                           -  Two 7 to 9 mm polyps in the transverse colon,                            removed with a cold snare. Resected and retrieved.                           - The examination was otherwise normal. Moderate Sedation:      Per Anesthesia Care Recommendation:           - Patient has a contact number available for                            emergencies. The signs and symptoms of potential                            delayed complications were discussed with the                            patient. Return to normal activities tomorrow.                            Written discharge instructions  were provided to the                            patient.                           - Resume previous diet.                           - Continue present medications.                           - Await pathology results.                           - Repeat colonoscopy in 3 years for surveillance.                           - Return to GI clinic in 8 weeks. Procedure Code(s):        --- Professional ---                           [phone], Colonoscopy, flexible; with removal of                            tumor(s), polyp(s), or other lesion(s) by snare                            technique Diagnosis Code(s):        --- Professional ---                           Z86.010, Personal history of colonic polyps  K64.8, Other hemorrhoids                           D12.2, Benign neoplasm of ascending colon                           D12.0, Benign neoplasm of cecum                           D12.3, Benign neoplasm of transverse colon (hepatic                            flexure or splenic flexure)                           K57.30, Diverticulosis of large intestine without                            perforation or abscess without bleeding CPT copyright 2022 American Medical Association. All rights reserved. The codes documented in this report are preliminary and upon coder review may  be revised to meet current compliance requirements. Hennie Duos. Marletta Lor, DO Hennie Duos. Marletta Lor, DO 05/09/2023 7:58:34 AM This report has been signed electronically. Number of Addenda: 0

## 2023-05-09 NOTE — Discharge Instructions (Signed)
  Colonoscopy Discharge Instructions  Read the instructions outlined below and refer to this sheet in the next few weeks. These discharge instructions provide you with general information on caring for yourself after you leave the hospital. Your doctor may also give you specific instructions. While your treatment has been planned according to the most current medical practices available, unavoidable complications occasionally occur.   ACTIVITY You may resume your regular activity, but move at a slower pace for the next 24 hours.  Take frequent rest periods for the next 24 hours.  Walking will help get rid of the air and reduce the bloated feeling in your belly (abdomen).  No driving for 24 hours (because of the medicine (anesthesia) used during the test).   Do not sign any important legal documents or operate any machinery for 24 hours (because of the anesthesia used during the test).  NUTRITION Drink plenty of fluids.  You may resume your normal diet as instructed by your doctor.  Begin with a light meal and progress to your normal diet. Heavy or fried foods are harder to digest and may make you feel sick to your stomach (nauseated).  Avoid alcoholic beverages for 24 hours or as instructed.  MEDICATIONS You may resume your normal medications unless your doctor tells you otherwise.  WHAT YOU CAN EXPECT TODAY Some feelings of bloating in the abdomen.  Passage of more gas than usual.  Spotting of blood in your stool or on the toilet paper.  IF YOU HAD POLYPS REMOVED DURING THE COLONOSCOPY: No aspirin products for 7 days or as instructed.  No alcohol for 7 days or as instructed.  Eat a soft diet for the next 24 hours.  FINDING OUT THE RESULTS OF YOUR TEST Not all test results are available during your visit. If your test results are not back during the visit, make an appointment with your caregiver to find out the results. Do not assume everything is normal if you have not heard from your  caregiver or the medical facility. It is important for you to follow up on all of your test results.  SEEK IMMEDIATE MEDICAL ATTENTION IF: You have more than a spotting of blood in your stool.  Your belly is swollen (abdominal distention).  You are nauseated or vomiting.  You have a temperature over 101.  You have abdominal pain or discomfort that is severe or gets worse throughout the day.   Your colonoscopy revealed 5 polyp(s) which I removed successfully. Await pathology results, my office will contact you. I recommend repeating colonoscopy in 3 years for surveillance purposes.   You also have diverticulosis and internal hemorrhoids. I would recommend increasing fiber in your diet or adding OTC Benefiber/Metamucil. Be sure to drink at least 4 to 6 glasses of water daily. Follow-up with GI 8 weeks   I hope you have a great rest of your week!  Hennie Duos. Marletta Lor, D.O. Gastroenterology and Hepatology [facility]

## 2023-05-09 NOTE — Anesthesia Preprocedure Evaluation (Addendum)
Anesthesia Evaluation  Patient identified by MRN, date of birth, ID band Patient awake    Reviewed: Allergy & Precautions, H&P , NPO status , Patient's Chart, lab work & pertinent test results, reviewed documented beta blocker date and time   Airway Mallampati: II  TM Distance: >3 FB Neck ROM: full    Dental no notable dental hx.    Pulmonary sleep apnea    Pulmonary exam normal breath sounds clear to auscultation       Cardiovascular Exercise Tolerance: Good hypertension,  Rhythm:regular Rate:Normal     Neuro/Psych Seizures -,  PSYCHIATRIC DISORDERS Anxiety Depression     Neuromuscular disease    GI/Hepatic Neg liver ROS,GERD  ,,  Endo/Other  diabetes  Class 3 obesity  Renal/GU negative Renal ROS  negative genitourinary   Musculoskeletal   Abdominal   Peds  Hematology negative hematology ROS (+)   Anesthesia Other Findings   Reproductive/Obstetrics negative OB ROS                             Anesthesia Physical Anesthesia Plan  ASA: 3  Anesthesia Plan: General   Post-op Pain Management:    Induction:   PONV Risk Score and Plan: Propofol infusion  Airway Management Planned:   Additional Equipment:   Intra-op Plan:   Post-operative Plan:   Informed Consent: I have reviewed the patients History and Physical, chart, labs and discussed the procedure including the risks, benefits and alternatives for the proposed anesthesia with the patient or authorized representative who has indicated his/her understanding and acceptance.     Dental Advisory Given  Plan Discussed with: CRNA  Anesthesia Plan Comments:        Anesthesia Quick Evaluation

## 2023-05-09 NOTE — Transfer of Care (Signed)
Immediate Anesthesia Transfer of Care Note  Patient: Deborah Carroll  Procedure(s) Performed: COLONOSCOPY WITH PROPOFOL POLYPECTOMY INTESTINAL  Patient Location: Short Stay  Anesthesia Type:General  Level of Consciousness: awake  Airway & Oxygen Therapy: Patient Spontanous Breathing  Post-op Assessment: Report given to RN and Post -op Vital signs reviewed and stable  Post vital signs: Reviewed and stable  Last Vitals:  Vitals Value Taken Time  BP 109/57 05/09/23 0756  Temp 36.5 C 05/09/23 0756  Pulse 57 05/09/23 0756  Resp 15 05/09/23 0756  SpO2 97 % 05/09/23 0756    Last Pain:  Vitals:   05/09/23 0756  TempSrc: Oral  PainSc: 0-No pain      Patients Stated Pain Goal: 5 (05/09/23 1610)  Complications: No notable events documented.

## 2023-05-12 ENCOUNTER — Encounter (HOSPITAL_COMMUNITY): Payer: Self-pay | Admitting: Internal Medicine

## 2023-05-12 LAB — SURGICAL PATHOLOGY

## 2023-05-18 ENCOUNTER — Other Ambulatory Visit: Payer: Self-pay | Admitting: Family Medicine

## 2023-05-19 ENCOUNTER — Other Ambulatory Visit: Payer: Self-pay

## 2023-05-19 DIAGNOSIS — R569 Unspecified convulsions: Secondary | ICD-10-CM

## 2023-05-19 MED ORDER — PHENYTOIN SODIUM EXTENDED 100 MG PO CAPS: ORAL_CAPSULE | ORAL | 0 refills | Status: DC

## 2023-05-22 ENCOUNTER — Other Ambulatory Visit: Payer: Self-pay | Admitting: Family Medicine

## 2023-05-28 ENCOUNTER — Other Ambulatory Visit: Payer: Self-pay | Admitting: Family Medicine

## 2023-06-01 ENCOUNTER — Other Ambulatory Visit: Payer: Self-pay | Admitting: Family Medicine

## 2023-06-03 ENCOUNTER — Encounter: Payer: Self-pay | Admitting: Family Medicine

## 2023-06-03 ENCOUNTER — Ambulatory Visit (INDEPENDENT_AMBULATORY_CARE_PROVIDER_SITE_OTHER): Payer: PPO | Admitting: Family Medicine

## 2023-06-03 VITALS — BP 135/78 | HR 66 | Temp 99.7°F | Ht 62.0 in

## 2023-06-03 DIAGNOSIS — E1169 Type 2 diabetes mellitus with other specified complication: Secondary | ICD-10-CM

## 2023-06-03 DIAGNOSIS — Z79899 Other long term (current) drug therapy: Secondary | ICD-10-CM

## 2023-06-03 DIAGNOSIS — Z7984 Long term (current) use of oral hypoglycemic drugs: Secondary | ICD-10-CM

## 2023-06-03 DIAGNOSIS — E119 Type 2 diabetes mellitus without complications: Secondary | ICD-10-CM | POA: Diagnosis not present

## 2023-06-03 DIAGNOSIS — G40909 Epilepsy, unspecified, not intractable, without status epilepticus: Secondary | ICD-10-CM | POA: Diagnosis not present

## 2023-06-03 DIAGNOSIS — Z79891 Long term (current) use of opiate analgesic: Secondary | ICD-10-CM

## 2023-06-03 DIAGNOSIS — E785 Hyperlipidemia, unspecified: Secondary | ICD-10-CM

## 2023-06-03 DIAGNOSIS — R6889 Other general symptoms and signs: Secondary | ICD-10-CM

## 2023-06-03 MED ORDER — METFORMIN HCL ER 500 MG PO TB24: ORAL_TABLET | ORAL | 2 refills | Status: AC

## 2023-06-03 MED ORDER — HYDROCODONE-ACETAMINOPHEN 10-325 MG PO TABS: ORAL_TABLET | ORAL | 0 refills | Status: DC

## 2023-06-03 MED ORDER — OSELTAMIVIR PHOSPHATE 75 MG PO CAPS: 75.0000 mg | ORAL_CAPSULE | Freq: Two times a day (BID) | ORAL | 0 refills | Status: DC

## 2023-06-03 MED ORDER — PANTOPRAZOLE SODIUM 40 MG PO TBEC: 40.0000 mg | DELAYED_RELEASE_TABLET | Freq: Every day | ORAL | 0 refills | Status: DC

## 2023-06-03 NOTE — Progress Notes (Unsigned)
 Subjective:    Patient ID: Deborah Carroll, female    DOB: [birth year], 68 y.o.   MRN: [id number]  HPI This patient was seen today for chronic pain  The medication list was reviewed and updated.   Location of Pain for which the patient has been treated with regarding narcotics: Patient has painful neuropathy in her legs as well as her hips and knees  Onset of this pain: This is been present for years   -Compliance with medication: Good compliance with the medication  - Number patient states they take daily: She takes a maximum of 4/day  -Reason for ongoing use of opioids burning pain discomfort neuropathy despite medications, have tried Lyrica in the past without success.  Patient did not get adequate response to standard measures  What other measures have been tried outside of opioids patient has tried NSAIDs, Tylenol, pregabalin without success  In the ongoing specialists regarding this condition none currently  -when was the last dose patient took?  Within the past 24 hours  The patient was advised the importance of maintaining medication and not using illegal substances with these.  Here for refills and follow up  The patient was educated that we can provide 3 monthly scripts for their medication, it is their responsibility to follow the instructions.  Side effects or complications from medications: No side effects recently  Patient is aware that pain medications are meant to minimize the severity of the pain to allow their pain levels to improve to allow for better function. They are aware of that pain medications cannot totally remove their pain.  Due for UDT ( at least once per year) (pain management contract is also completed at the time of the UDT): Today  Scale of 1 to 10 ( 1 is least 10 is most) Your pain level without the medicine: 8 Your pain level with medication 2  Scale 1 to 10 ( 1-helps very little, 10 helps very well) How well does your pain medication  reduce your pain so you can function better through out the day?  10  Quality of the pain: Burning aching  Persistence of the pain: Present all the time  Modifying factors: Some days worse than others some days worse with activity      Patient presents today with respiratory illness Number of days present-less than 2 days  Symptoms include-burning throat congestion coughing body aches headache  Presence of worrisome signs (severe shortness of breath, lethargy, etc.) -no wheezing or shortness of breath  Recent/current visit to urgent care or ER-none  Recent direct exposure to Covid-none, has been exposed to the flu  Any current Covid testing-none  Patient does have diabetes.  She is taking metformin.  Trying to watch her diet.  Her weight goes up.  She relates a lot of fatigue tiredness.  She is interested with trying something in addition to the metformin to get her diabetes under better control She does have high risk factors for heart disease she also has morbid obesity    Review of Systems     Objective:   Physical Exam Gen-NAD not toxic TMS-normal bilateral T- normal no redness Chest-CTA respiratory rate normal no crackles CV RRR no murmur Skin-warm dry Neuro-grossly normal        Assessment & Plan:  1. High risk medication use (Primary) Lab work ordered - Economist 13 (MW), Urine - Basic metabolic panel  2. Diabetes mellitus without complication (HCC) Continue metformin watch diet stay active patient  with morbid obesity  - Basic metabolic panel - Hemoglobin A1c  3. Hyperlipidemia associated with type 2 diabetes mellitus (HCC) Continue statin continue healthy diet try to keep LDL below 70 labs in November  4. Flu-like symptoms Tamiflu twice daily for 5 days Triple swab Warning signs discussed Follow-up if problems - oseltamivir (TAMIFLU) 75 MG capsule; Take 1 capsule (75 mg total) by mouth 2 (two) times daily.  Dispense: 10 capsule; Refill:  0 - COVID-19, Flu A+B and RSV  5. Seizure disorder (HCC) Continue antiseizure medicines.  6. Encounter for long-term opiate analgesic use The patient was seen in followup for chronic pain. A review over at their current pain status was discussed. Drug registry was checked. Prescriptions were given.  Regular follow-up recommended. Discussion was held regarding the importance of compliance with medication as well as pain medication contract.  Patient was informed that medication may cause drowsiness and should not be combined  with other medications/alcohol or street drugs. If the patient feels medication is causing altered alertness then do not drive or operate dangerous equipment.  Should be noted that the patient appears to be meeting appropriate use of opioids and response.  Evidenced by improved function and decent pain control without significant side effects and no evidence of overt aberrancy issues.  Upon discussion with the patient today they understand that opioid therapy is optional and they feel that the pain has been refractory to reasonable conservative measures and is significant and affecting quality of life enough to warrant ongoing therapy and wishes to continue opioids.  Refills were provided.  [facility] medical Board guidelines regarding the pain medicine has been reviewed.  CDC guidelines most updated 2022 has been reviewed by the prescriber.  PDMP is checked on a regular basis yearly urine drug screen and pain management contract  Treatment plan for this patient includes #1-gentle stretching exercises as shown daily basis 2.  Mild strength exercises 3 times per week #3 continue pain medications #4 notify us if any digression  Prescriptions were sent in.  Warning signs regarding flu complications including pneumonia was discussed with the the patient in detail

## 2023-06-05 ENCOUNTER — Encounter: Payer: Self-pay | Admitting: Family Medicine

## 2023-06-05 LAB — COVID-19, FLU A+B AND RSV

## 2023-06-05 LAB — SPECIMEN STATUS REPORT

## 2023-06-05 LAB — TOXASSURE SELECT 13 (MW), URINE

## 2023-06-05 LAB — HEMOGLOBIN A1C

## 2023-06-12 ENCOUNTER — Encounter: Payer: Self-pay | Admitting: Family Medicine

## 2023-06-13 ENCOUNTER — Other Ambulatory Visit: Payer: Self-pay | Admitting: Family Medicine

## 2023-06-21 ENCOUNTER — Other Ambulatory Visit: Payer: Self-pay | Admitting: Family Medicine

## 2023-06-23 ENCOUNTER — Other Ambulatory Visit: Payer: Self-pay | Admitting: Family Medicine

## 2023-06-23 ENCOUNTER — Other Ambulatory Visit: Payer: Self-pay

## 2023-06-23 MED ORDER — PROPRANOLOL HCL 10 MG PO TABS: 10.0000 mg | ORAL_TABLET | Freq: Two times a day (BID) | ORAL | 0 refills | Status: DC

## 2023-07-01 ENCOUNTER — Other Ambulatory Visit: Payer: Self-pay | Admitting: Family Medicine

## 2023-07-02 NOTE — Progress Notes (Deleted)
 GI Office Note    Referring Provider: Babs Sciara, MD Primary Care Physician:  Babs Sciara, MD Primary Gastroenterologist: Hennie Duos. Marletta Lor, DO  Date:  07/02/2023  ID:  Deborah Carroll, Deborah Carroll [birth year], MRN [id number]   Chief Complaint   No chief complaint on file.   History of Present Illness  Deborah Carroll is a 68 y.o. female with a history of seizures, OSA on CPAP, neuropathy, type 2 diabetes, HTN, obesity, chronic pain, anxiety/depression, and IBS presenting today for follow-up.  Colonoscopy August 2020: -Two 10-11 mm polyps in the proximal transverse colon and proximal ascending colon -Three 3-4 mm polyps in the rectum and in the proximal ascending colon -External and internal hemorrhoids -Tortuous left colon -Advise high-fiber diet and weight loss -Pathology revealed 3 simple adenomas and 3 hyperplastic polyps -Repeat colonoscopy in 3 years   CT scan July 2021 with contrast: -Moderate colonic stool burden -Fluid distended stomach but otherwise unremarkable   EGD February 2022: -Gastritis s/p biopsy -Multiple gastric polyps s/p resection -Normal duodenum s/p biopsy -PPI twice daily -Advised to avoid NSAIDs -Biopsies benign -Biopsies negative for H. 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 Marletta Lor.  Patient reported ongoing diarrhea for 5 to 6 months.  She was taking dicyclomine which helps some as well as Imodium for relief every 1-2 weeks and it does stop her diarrhea.  Denied any melena or hematochezia.  Also noted to be s/p cholecystectomy therefore bile acid component also on differential.  Also advised given her age and gender microscopic colitis is also in differential.  Advised to start by increasing her dicyclomine to twice daily and more aggressive use of Imodium and follow-up in 6 weeks.  If no improvement advised to consider repeat colonoscopy to rule out microscopic  colitis and consider trial of colestipol.   STK 11 testing noted to be negative  Last office visit 03/04/23.  Pantoprazole controlling GERD.  Did note some occasional abdominal discomfort with distention.  Initially was not going to the bathroom and then having hard stools.  Stated relief improved on the medication for IBS but reports her stools were muddy and then will turn the water.  Can feel pain in her left abdomen that goes down the middle of her abdomen.  Denies straining.  Having a bowel movement every other day and then they go couple times.  Taking Bentyl twice daily and does not like it helps.  Pain does improve after bowel movement.  Normal good appetite.  Noted to be on Lyrica.  Has taken Pepto at times and will have dark stools.  Advised KUB.  Scheduled for colonoscopy to rule out microscopic colitis.  Continue pantoprazole once daily and we will determine if treatment for constipation is needed versus diarrhea after obtaining x-ray.  Discussed low FODMAP diet and advised high-fiber.  KUB 03/04/2023: -Increase stool noted consistent with constipation -Normal bowel gas pattern -Degenerative hip changes bilaterally -Advised to start MiraLAX once daily.  Colonoscopy 05/09/2023: -Nonbleeding internal hemorrhoids -Sigmoid and descending diverticulosis -Three 4-6 mm polyps in the ascending colon and cecum -Two 7-9 mm polyps in the transverse colon -Path: Tubular adenomas without dysplasia -Repeat colonoscopy in 3 years  Today:    Wt Readings from Last 3 Encounters:  05/05/23 220 lb (99.8 kg)  03/04/23 235 lb 3.2 oz (106.7 kg)  02/26/23 236 lb 9.6 oz (107.3 kg)    Current Outpatient Medications  Medication Sig Dispense Refill   dicyclomine (BENTYL) 10 MG capsule Take 1 capsule by mouth three times daily as needed 90 capsule 0   hydrochlorothiazide (HYDRODIURIL) 25 MG tablet Take 1 tablet by mouth once daily 360 tablet 0   HYDROcodone-acetaminophen (NORCO) 10-325 MG tablet 1  taken 4 times daily maximum per day as needed for pain 120 tablet 0   HYDROcodone-acetaminophen (NORCO) 10-325 MG tablet 1 taken 4 times daily as needed for pain 120 tablet 0   HYDROcodone-acetaminophen (NORCO) 10-325 MG tablet 1 taken up to 4 times daily as needed for pain 120 tablet 0   metFORMIN (GLUCOPHAGE-XR) 500 MG 24 hr tablet Take 1 tablet by mouth once daily 90 tablet 2   oseltamivir (TAMIFLU) 75 MG capsule Take 1 capsule (75 mg total) by mouth 2 (two) times daily. 10 capsule 0   pantoprazole (PROTONIX) 40 MG tablet Take 1 tablet (40 mg total) by mouth daily. 90 tablet 0   phenytoin (DILANTIN) 100 MG ER capsule TAKE 2 CAPSULES BY MOUTH TWICE DAILY - IN THE MORNING AND IN THE EVENING 360 capsule 0   Potassium Chloride ER 20 MEQ TBCR Take 1 tablet by mouth twice daily 180 tablet 1   propranolol (INDERAL) 10 MG tablet Take 1 tablet by mouth twice daily 60 tablet 0   rosuvastatin (CRESTOR) 5 MG tablet Take 1 tablet by mouth once daily 90 tablet 0   sertraline (ZOLOFT) 100 MG tablet Take 2 tablets by mouth once daily 180 tablet 1   torsemide (DEMADEX) 20 MG tablet Take 1 tablet (20 mg total) by mouth daily as needed (fluid or swelling). 30 tablet 2   No current facility-administered medications for this visit.    Past Medical History:  Diagnosis Date   Anxiety    Chronic pain    Depression    Diabetes mellitus without complication (HCC)    GERD (gastroesophageal reflux disease)    Hypertension    Morbid obesity (HCC) 03/20/2018   Neuropathy    Peripheral neuropathy    Peripheral neuropathy    Seizures (HCC)    last one 1992- Increased BP with delivery and is on meds still.   Sleep apnea    CPAP x 15 years    Past Surgical History:  Procedure Laterality Date   ABDOMINAL HYSTERECTOMY     ovaries present, no cancer,approx 2001   BACK SURGERY     BIOPSY  05/23/2020   Procedure: BIOPSY;  Surgeon: Lanelle Bal, DO;  Location: [facility];  Service: Endoscopy;;   BREAST  BIOPSY Left 08/19/2006   Benign calcifications and sclerotic lobules   CARPAL TUNNEL RELEASE     Bilateral   CATARACT EXTRACTION W/PHACO Right 06/20/2015   Procedure: CATARACT EXTRACTION PHACO AND INTRAOCULAR LENS PLACEMENT (IOC);  Surgeon: Jethro Bolus, MD;  Location: [facility];  Service: Ophthalmology;  Laterality: Right;  CDE:6.25   CATARACT EXTRACTION W/PHACO Left 08/29/2015   Procedure: CATARACT EXTRACTION PHACO AND INTRAOCULAR LENS PLACEMENT (IOC);  Surgeon: Jethro Bolus, MD;  Location: [facility];  Service: Ophthalmology;  Laterality: Left;  CDE: 4.11   CESAREAN SECTION     x2   CHOLECYSTECTOMY     COLONOSCOPY  10/14/2002   VHQ:[id number] involving descending colon, splenic flexure and distal transverse colon with most extensive inflammation in the splenic flexure area.  These changes are typical of ischemic colitis.  Biopsy taken.   COLONOSCOPY N/A 08/04/2012   SLF: POLYPOID LESION IN AT THE APPENDICEAL ORIFICE/Polyp  in the descending  colon/Moderate melanosis throughout the entire examined colon/ The colon IS redundant. small tubular adenoma.    COLONOSCOPY WITH PROPOFOL N/A 11/17/2018   3 tubular adenomas removed, 3 hyperplastic polyps, next colonoscopy in 11/2021.    COLONOSCOPY WITH PROPOFOL N/A 05/09/2023   Procedure: COLONOSCOPY WITH PROPOFOL;  Surgeon: Lanelle Bal, DO;  [facility]: [facility];  Service: Endoscopy;  Laterality: N/A;  730am, asa 3   ESOPHAGOGASTRODUODENOSCOPY (EGD) WITH PROPOFOL N/A 05/23/2020   Procedure: ESOPHAGOGASTRODUODENOSCOPY (EGD) WITH PROPOFOL;  Surgeon: Lanelle Bal, DO;  [facility]: [facility];  Service: Endoscopy;  Laterality: N/A;  11:45am   FOOT SURGERY Bilateral 2004   hammer toe repairs   PARS PLANA VITRECTOMY W/ REPAIR OF MACULAR HOLE     POLYPECTOMY  11/17/2018   Procedure: POLYPECTOMY;  Surgeon: West Bali, MD;  Location: [facility];  Service: Endoscopy;;   POLYPECTOMY  05/23/2020   Procedure: POLYPECTOMY;  Surgeon: Lanelle Bal, DO;  [facility];  Service: Endoscopy;;   POLYPECTOMY  05/09/2023   Procedure: POLYPECTOMY INTESTINAL;  Surgeon: Lanelle Bal, DO;  [facility];  Service: Endoscopy;;    Family History  Problem Relation Age of Onset   Hypertension Maternal Grandmother    Heart attack Maternal Grandfather    Emphysema Father    Asthma Father    Colon polyps Mother 41       benign    Colon cancer Neg Hx    Liver disease Neg Hx     Allergies as of 07/03/2023   (No Known Allergies)    Social History   Socioeconomic History   Marital status: Married    Spouse name: Not on file   Number of children: 2   Years of education: 12   Highest education level: 12th grade  Occupational History   Occupation: disability  Tobacco Use   Smoking status: Never   Smokeless tobacco: Never   Tobacco comments:    smoked x 4 years many years ago  Vaping Use   Vaping status: Never Used  Substance and Sexual Activity   Alcohol use: Not Currently   Drug use: No   Sexual activity: Yes    Birth control/protection: Surgical    Comment: hyst  Other Topics Concern   Not on file  Social History Narrative   lives with husband   Social Drivers of Health   Financial Resource Strain: Low Risk  (05/31/2023)   Overall Financial Resource Strain (CARDIA)    Difficulty of Paying Living Expenses: Not hard at all  Food Insecurity: No Food Insecurity (05/31/2023)   Hunger Vital Sign    Worried About Running Out of Food in the Last Year: Never true    Ran Out of Food in the Last Year: Never true  Transportation Needs: No Transportation Needs (05/31/2023)   PRAPARE - Transportation    Lack of Transportation (Medical): No    Lack of Transportation (Non-Medical): No  Physical Activity: Insufficiently Active (05/31/2023)   Exercise Vital Sign    Days of Exercise per Week: 1 day    Minutes of Exercise per Session: 20 min  Stress: Stress Concern Present (05/31/2023)   [employer]  of Occupational Health - Occupational Stress Questionnaire    Feeling of Stress : To some extent  Social Connections: Socially Integrated (05/31/2023)   Social Connection and Isolation Panel [NHANES]    Frequency of Communication with Friends and Family: More than three times a week  Frequency of Social Gatherings with Friends and Family: More than three times a week    Attends Religious Services: More than 4 times per year    Active Member of [employer] or Organizations: Yes    Attends Engineer, structural: More than 4 times per year    Marital Status: Married     Review of Systems   Gen: Denies fever, chills, anorexia. Denies fatigue, weakness, weight loss.  CV: Denies chest pain, palpitations, syncope, peripheral edema, and claudication. Resp: Denies dyspnea at rest, cough, wheezing, coughing up blood, and pleurisy. GI: See HPI Derm: Denies rash, itching, dry skin Psych: Denies depression, anxiety, memory loss, confusion. No homicidal or suicidal ideation.  Heme: Denies bruising, bleeding, and enlarged lymph nodes.  Physical Exam   There were no vitals taken for this visit.  General:   Alert and oriented. No distress noted. Pleasant and cooperative.  Head:  Normocephalic and atraumatic. Eyes:  Conjuctiva clear without scleral icterus. Mouth:  Oral mucosa pink and moist. Good dentition. No lesions. Lungs:  Clear to auscultation bilaterally. No wheezes, rales, or rhonchi. No distress.  Heart:  S1, S2 present without murmurs appreciated.  Abdomen:  +BS, soft, non-tender and non-distended. No rebound or guarding. No HSM or masses noted. Rectal: *** Msk:  Symmetrical without gross deformities. Normal posture. Extremities:  Without edema. Neurologic:  Alert and  oriented x4 Psych:  Alert and cooperative. Normal mood and affect.  Assessment  KATALENA MALVEAUX is a 68 y.o. female with a history of seizures, OSA on CPAP, neuropathy, type 2 diabetes, HTN, obesity, chronic pain,  anxiety/depression, and IBS presenting today for follow-up.  History of colon polyps:  IBS with constipation: Previous diarrhea likely secondary to medication or possible overflow.  PLAN   ***     Deborah Bonito, Deborah Carroll, Deborah Carroll, Deborah Carroll [facility]

## 2023-07-03 ENCOUNTER — Ambulatory Visit: Payer: PPO | Admitting: Gastroenterology

## 2023-07-08 DIAGNOSIS — G4733 Obstructive sleep apnea (adult) (pediatric): Secondary | ICD-10-CM | POA: Diagnosis not present

## 2023-07-21 ENCOUNTER — Other Ambulatory Visit: Payer: Self-pay | Admitting: Family Medicine

## 2023-07-31 ENCOUNTER — Other Ambulatory Visit: Payer: Self-pay | Admitting: Family Medicine

## 2023-07-31 DIAGNOSIS — E1169 Type 2 diabetes mellitus with other specified complication: Secondary | ICD-10-CM

## 2023-08-18 ENCOUNTER — Other Ambulatory Visit: Payer: Self-pay | Admitting: Family Medicine

## 2023-08-18 DIAGNOSIS — R569 Unspecified convulsions: Secondary | ICD-10-CM

## 2023-08-25 ENCOUNTER — Other Ambulatory Visit: Payer: Self-pay | Admitting: Family Medicine

## 2023-08-25 DIAGNOSIS — R569 Unspecified convulsions: Secondary | ICD-10-CM

## 2023-08-26 ENCOUNTER — Other Ambulatory Visit: Payer: Self-pay

## 2023-08-26 DIAGNOSIS — R569 Unspecified convulsions: Secondary | ICD-10-CM

## 2023-08-26 MED ORDER — PHENYTOIN SODIUM EXTENDED 100 MG PO CAPS: ORAL_CAPSULE | ORAL | 0 refills | Status: DC

## 2023-08-27 ENCOUNTER — Encounter: Payer: Self-pay | Admitting: Family Medicine

## 2023-08-27 ENCOUNTER — Other Ambulatory Visit: Payer: Self-pay

## 2023-08-27 ENCOUNTER — Ambulatory Visit (INDEPENDENT_AMBULATORY_CARE_PROVIDER_SITE_OTHER): Payer: PPO | Admitting: Family Medicine

## 2023-08-27 VITALS — BP 113/66 | HR 56 | Temp 97.9°F | Ht 62.0 in | Wt 232.0 lb

## 2023-08-27 DIAGNOSIS — G40909 Epilepsy, unspecified, not intractable, without status epilepticus: Secondary | ICD-10-CM

## 2023-08-27 DIAGNOSIS — R251 Tremor, unspecified: Secondary | ICD-10-CM

## 2023-08-27 DIAGNOSIS — R768 Other specified abnormal immunological findings in serum: Secondary | ICD-10-CM

## 2023-08-27 DIAGNOSIS — E119 Type 2 diabetes mellitus without complications: Secondary | ICD-10-CM

## 2023-08-27 DIAGNOSIS — Z79899 Other long term (current) drug therapy: Secondary | ICD-10-CM

## 2023-08-27 DIAGNOSIS — G609 Hereditary and idiopathic neuropathy, unspecified: Secondary | ICD-10-CM

## 2023-08-27 DIAGNOSIS — E538 Deficiency of other specified B group vitamins: Secondary | ICD-10-CM

## 2023-08-27 DIAGNOSIS — E1169 Type 2 diabetes mellitus with other specified complication: Secondary | ICD-10-CM

## 2023-08-27 DIAGNOSIS — G629 Polyneuropathy, unspecified: Secondary | ICD-10-CM

## 2023-08-27 DIAGNOSIS — R5383 Other fatigue: Secondary | ICD-10-CM

## 2023-08-27 DIAGNOSIS — R569 Unspecified convulsions: Secondary | ICD-10-CM

## 2023-08-27 DIAGNOSIS — E785 Hyperlipidemia, unspecified: Secondary | ICD-10-CM

## 2023-08-27 MED ORDER — PREGABALIN 25 MG PO CAPS
25.0000 mg | ORAL_CAPSULE | Freq: Two times a day (BID) | ORAL | 3 refills | Status: DC
Start: 2023-08-27 — End: 2023-11-20

## 2023-08-27 MED ORDER — PHENYTOIN SODIUM EXTENDED 100 MG PO CAPS
ORAL_CAPSULE | ORAL | 2 refills | Status: AC
Start: 2023-08-27 — End: ?

## 2023-08-27 MED ORDER — HYDROCODONE-ACETAMINOPHEN 10-325 MG PO TABS: ORAL_TABLET | ORAL | 0 refills | Status: DC

## 2023-08-27 MED ORDER — PANTOPRAZOLE SODIUM 40 MG PO TBEC: 40.0000 mg | DELAYED_RELEASE_TABLET | Freq: Every day | ORAL | 2 refills | Status: AC

## 2023-08-27 MED ORDER — SERTRALINE HCL 100 MG PO TABS: 200.0000 mg | ORAL_TABLET | Freq: Every day | ORAL | 1 refills | Status: DC

## 2023-08-27 MED ORDER — HYDROCHLOROTHIAZIDE 25 MG PO TABS
25.0000 mg | ORAL_TABLET | Freq: Every day | ORAL | 0 refills | Status: AC
Start: 2023-08-27 — End: ?

## 2023-08-27 NOTE — Progress Notes (Signed)
 Subjective:    Patient ID: Deborah Carroll, female    DOB: [birth year], 68 y.o.   MRN: [id number]  HPI  3 month follow up Pain medication as well as all other medications need refills  Hand tremors Foot numbness worse  Discussed the use of AI scribe software for clinical note transcription with the patient, who gave verbal consent to proceed.  History of Present Illness   Deborah Carroll is a 68 year old female with neuropathy who presents with worsening symptoms in her hands and legs.  She experiences worsening neuropathy symptoms, particularly in her hands and legs. Her hands feel numb with tingling sensations, and she often drops objects. She has difficulty with tasks requiring fine motor skills, such as holding a fork or a small glass, and notes that her hands tremble, especially as the day progresses. The tremor affects both hands and is more pronounced when she is tired. No significant weakness in grip strength is reported.  In her legs, the neuropathy has progressed up to her knees. She has difficulty sensing pressure when walking, which affects her balance, particularly on unfamiliar or uneven surfaces. She relies on holding onto rails when using stairs to prevent stumbling. She walked out of her flip flops without noticing, indicating a lack of sensation in her feet.  She is currently taking hydrocodone  for discomfort, which provides relief most of the time, but it takes about an hour and a half to two hours to take effect. She also takes sertraline , propranolol , phenytoin , hydrochlorothiazide , potassium, and torsemide . She uses torsemide  infrequently, about once a month. She recalls trying medications like Cymbalta  and Lyrica  in the past, noting weight gain as a side effect.  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      Review of Systems     Objective:   Physical  Exam  General-in no acute distress Eyes-no discharge Lungs-respiratory rate normal, CTA CV-no murmurs,RRR Extremities skin warm dry no edema Neuro grossly normal Behavior normal, alert Small amount of tremors noted in both hands no sign of cogwheeling Diabetic foot exam reveals neuropathy fairly severe in both feet and ankles and lower legs Also on examination she has slight tremor but she is able to stack items and feel coins okay     Assessment & Plan:  1. Diabetes mellitus without complication (HCC) (Primary) Check A1c Await the findings May need additional medicines GLP-1's discussed  2. Hyperlipidemia associated with type 2 diabetes mellitus (HCC) Continue medication check lab work  3. Seizure disorder (HCC) On phenytoin  continue medication check lab  4. Morbid obesity (HCC) Portion control regular physical activity  5. Tremor Consider primidone, propranolol  not really helping Neurology consult as well patient would like a different opinion from a different neurologist  6. Elevated serum immunoglobulin free light chain level Will do follow-up lab work regarding this  7. Idiopathic peripheral neuropathy Pain medication refills will be given The patient was seen in followup for chronic pain. A review over at their current pain status was discussed. Drug registry was checked. Prescriptions were given.  Regular follow-up recommended. Discussion was held regarding the importance of compliance with medication as well as pain medication contract.  Patient was informed that medication may cause drowsiness and should not be combined  with other medications/alcohol or street drugs. If the patient feels medication is causing altered alertness then do not drive or operate  dangerous equipment.  Should be noted that the patient appears to be meeting appropriate use of opioids and response.  Evidenced by improved function and decent pain control without significant side effects  and no evidence of overt aberrancy issues.  Upon discussion with the patient today they understand that opioid therapy is optional and they feel that the pain has been refractory to reasonable conservative measures and is significant and affecting quality of life enough to warrant ongoing therapy and wishes to continue opioids.  Refills were provided.  [place]  medical Board guidelines regarding the pain medicine has been reviewed.  CDC guidelines most updated 2022 has been reviewed by the prescriber.  PDMP is checked on a regular basis yearly urine drug screen and pain management contract  Treatment plan for this patient includes #1-gentle stretching exercises as shown daily basis 2.  Mild strength exercises 3 times per week #3 continue pain medications #4 notify us  if any digression  Also will start low-dose Lyrica  to see if it will help with some of the discomfort from her neuropathy and adjust dose patient wonders if she had weight gain associated with Lyrica  previously but that probability of that is considered low whereas the benefit of the medicine outweighs the negatives patient agrees she will send us  a follow-up within several weeks how that is doing  We will check B12 level given metformin  use as well as given progressive neuropathy

## 2023-08-27 NOTE — Patient Instructions (Signed)
Discussion of GLP-1 medications Please remember these medications are to assist with weight reduction and diabetes control.  They do not take the place of healthy eating and regular physical activity. There are several GLP-1 medications that can help with weight reduction and diabetes control.  GLP-1 medications with indication for diabetes-Trulicity, Victoza, Bydureon , Mounjaro, Ozempic, and Rybelsus (although these are injected medicines except for Rybelsus which is a pill) GLP-1 medications with indications for weight loss-Wegovy, and Saxenda  Mechanism of action  These medications stimulate glucagon peptide receptors.  By doing so it does the following:  1-slows down stomach emptying-essentially food takes longer to go through the stomach and the intestines-this can lessen appetite  2-reduces glucagon secretion from the pancreas-this helps keep blood glucose levels stable between meals  3-increase his insulin release from the pancreas-with diabetics this helps keep blood glucose levels stable  4-promotes the feeling of being full in the brain-encouraged him receptors in the brain received the signal so the brain and body know that it is time to stop eating Benefits of the medication  1-reduced weight-reduction of weight is more significant at higher dosing.  Not as much weight reduction with Rybelsus   A- typically Wegovy at higher doses can assist with significant weight reduction.  2-improved blood glucose levels-with diabetics typically we see a significant drop with A1c when the medication is adjusted appropriately.  3-decrease risk of heart attacks and strokes-individuals with type 2 diabetes are at increased risk of heart attacks and strokes.  These GLP-1 medications can reduce the risk by 22%.  Risk of GLP-1 medications-these are some of the risks.  It is important to talk with your provider in a shared discussion before starting any medication.  Contraindications to GLP-1  medications-in other words who should not take these.  1-individuals with history of thyroid medullary cancer  2-individuals with family history of multiple endocrine neoplasm syndrome type II (MEN 2)  3-individuals with a history of pancreatitis  4-those with a history of severe hypersensitivity or allergy reactions to GLP-1 medications  5-should be avoided in individuals with a history of suicidal attempts or active suicidal ideation.  Cautions include- 1- risk of thyroid C-cell tumors were seen in rats during clinical testing in humans the relevancy of this information has not been determined 2-risk of pancreatitis including fatal and nonfatal hemorrhagic or necrotizing pancreatitis 3-gallbladder disease slightly increased risk of gallstones 4-hypoglycemia-more common with individuals who are on insulin or sulfonylurea such as glipizide 5-acute kidney injury or worsening of chronic renal failure often this is triggered by severe vomiting and diarrhea as side effects to GLP-1. 6-slightly increased risk of diabetic retinopathy complications in patients with type 2 diabetes 7-heart rate increase-slight increase of base heart rate with GLP-1 8-suicidal behavior and ideation has been reported in clinical trials with other weight loss medicines.  If depression occurs with medication or suicidal ideation stop medication immediately notify provider or seek help.  Common side effects include nausea, vomiting, diarrhea, constipation and increased heart rate.  Less common side effects severe abdominal pain-if this occurs notify provider stop medication get tested for pancreatitis. Typically weight loss with GLP-1 medicines can be anywhere between 10% weight loss all the way up to closer to 20% weight loss.  Greater weight loss occurs with the higher dosing.  Increase potential for side effects also increase with higher dosing including nausea.  Typically the weight loss occurs gradually over 1 years time.   In individuals who are no longer able to  take the medication or choose to stop the medication typically regain at least 90% of the weight that they have lost over the next 6 months.   In other words this medication should not be utilized as a as needed weight loss medication.  In other words something a person uses for 5 to 6 months to help him/her lose weight and then stop the medication and "maintain the loss by healthy eating alone".  This study showed that people regain your weight when they stop the medicine.  Currently the scientific view of this medication is becomes a lifelong medication.  That can be a big challenge for many people.  And for many individuals a economic challenge. Full discussion with your provider should occur before starting these medicines.  Should you desire additional detailed discussion we would recommend an office visit to sit down and discuss further.

## 2023-09-04 IMAGING — MG MM DIGITAL SCREENING BILAT W/ TOMO AND CAD
8 series · 9 of 24 positions shown · non-contrast
Comparison: Previous exam(s).

CLINICAL DATA: Screening.

EXAM:
DIGITAL SCREENING BILATERAL MAMMOGRAM WITH TOMOSYNTHESIS AND CAD
TECHNIQUE: Bilateral screening digital craniocaudal and mediolateral oblique
mammograms were obtained. Bilateral screening digital breast
tomosynthesis was performed. The images were evaluated with
computer-aided detection.

[R MLO synth-2D]
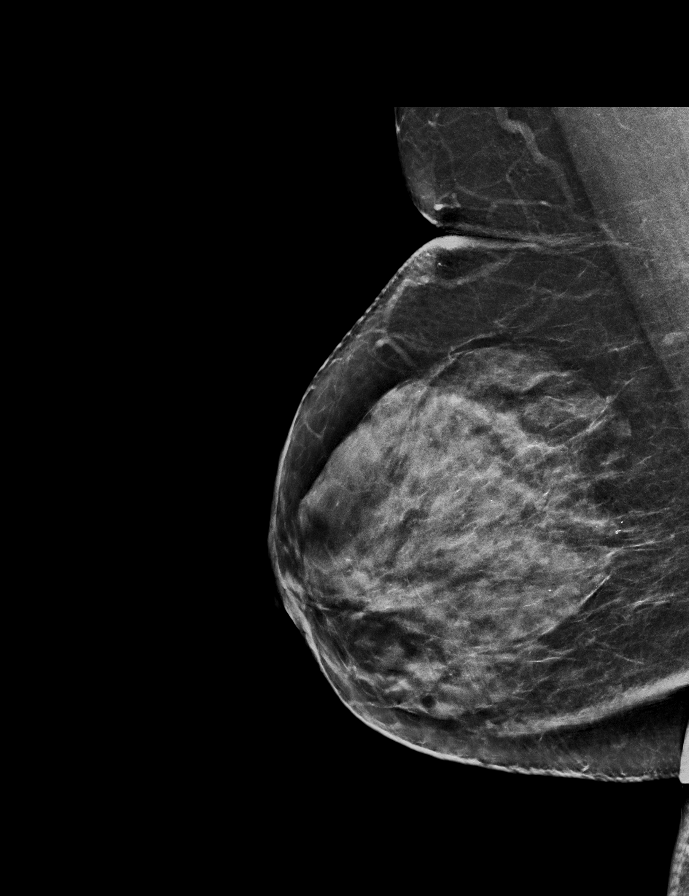

[R CC synth-2D]
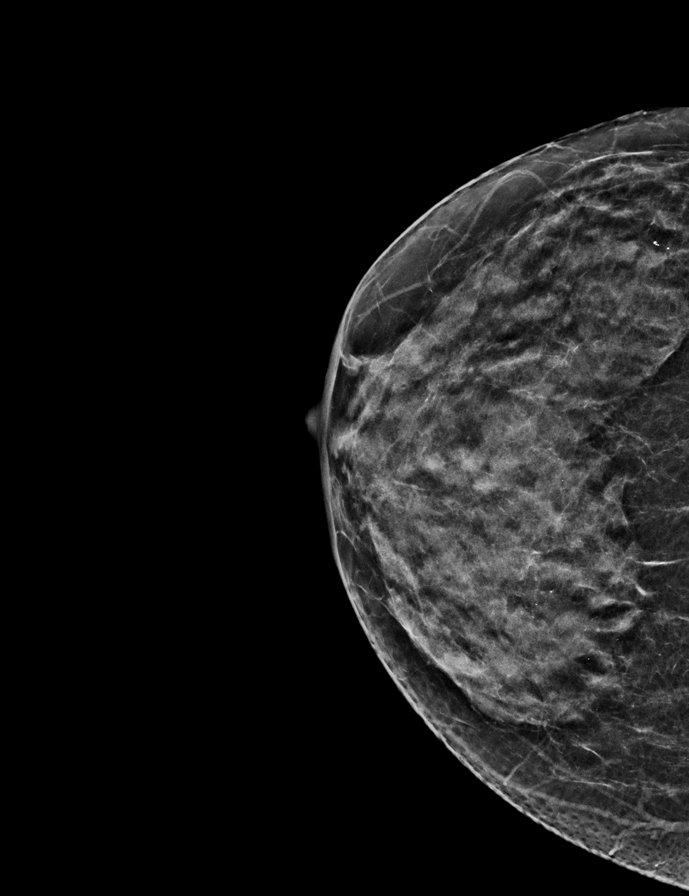

[L MLO synth-2D]
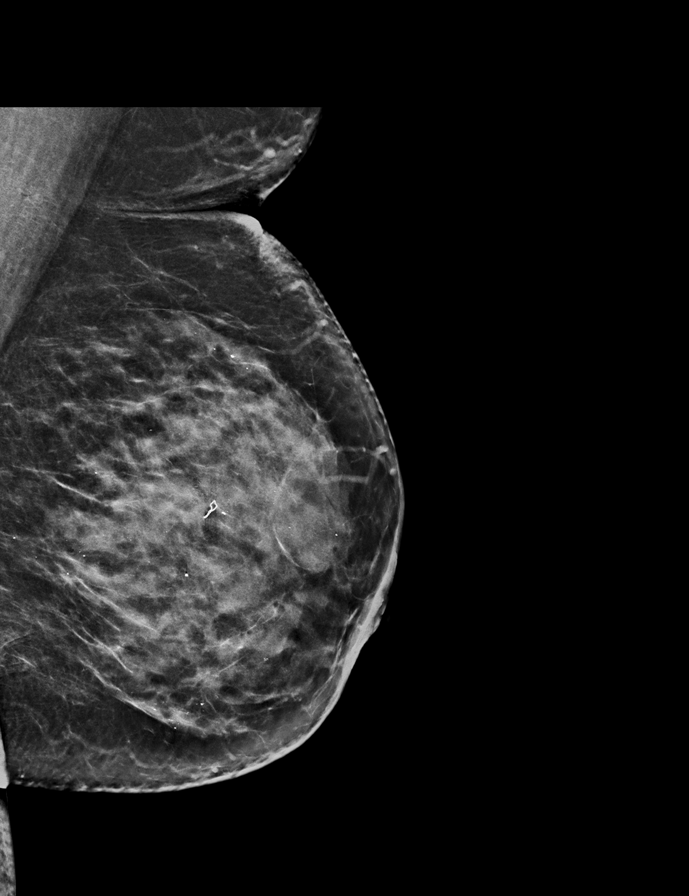

[L CC synth-2D]
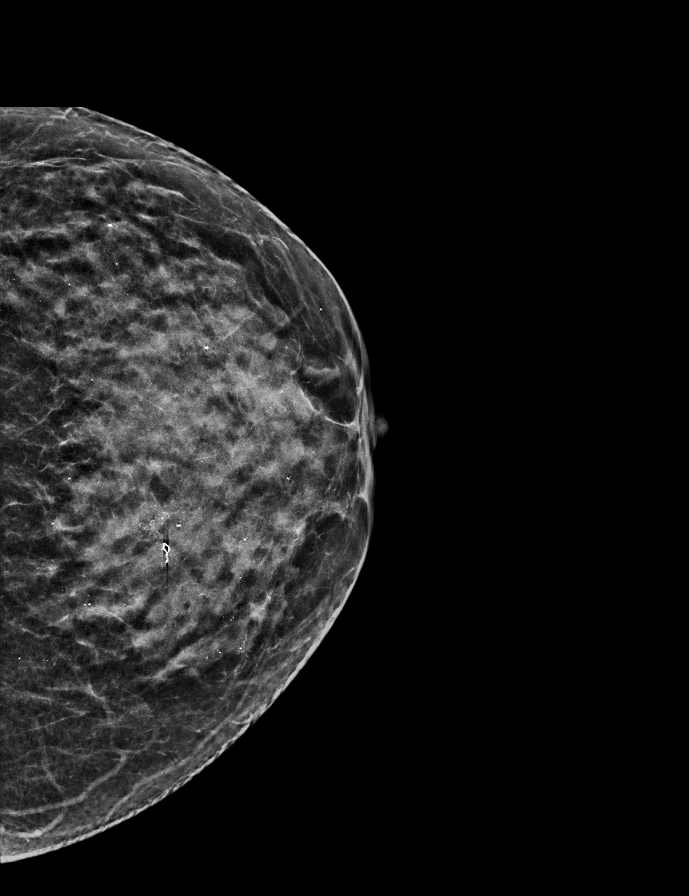

[L CC tomo · 2 of 50 frames shown]
[frame 17/50]
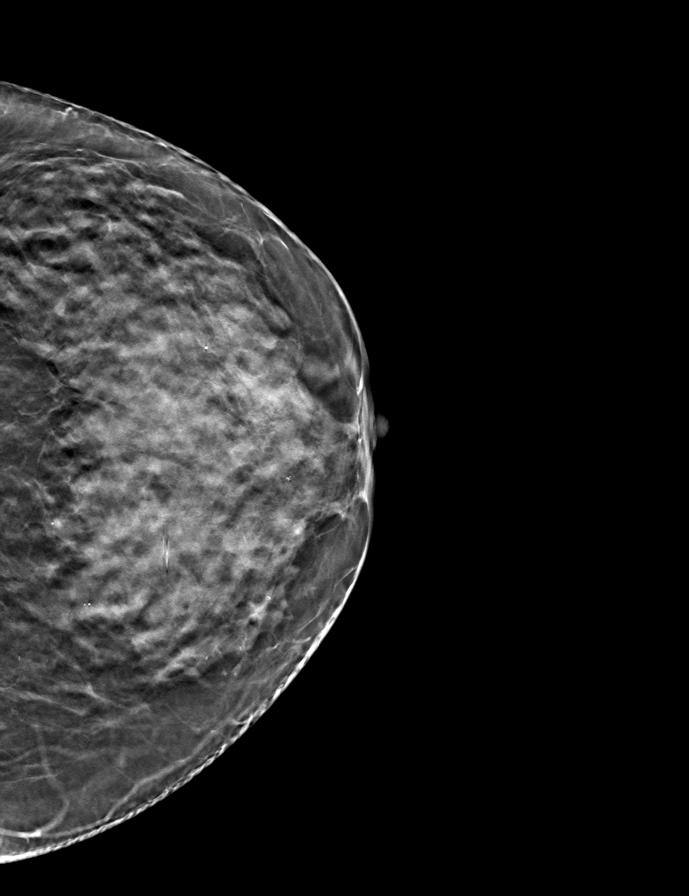
[frame 25/50]
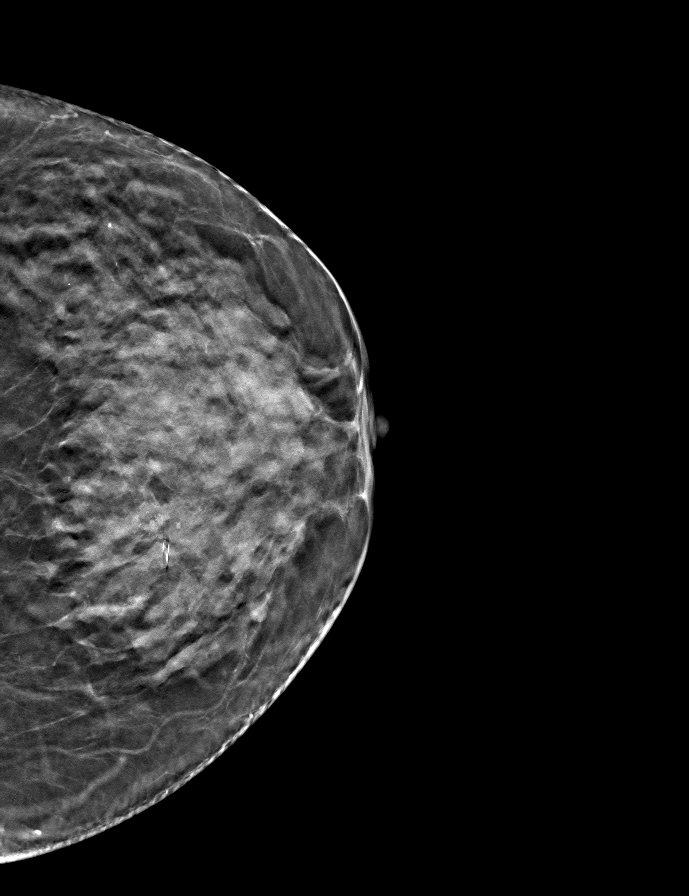

[R MLO tomo · tomo slice 33/65.0]
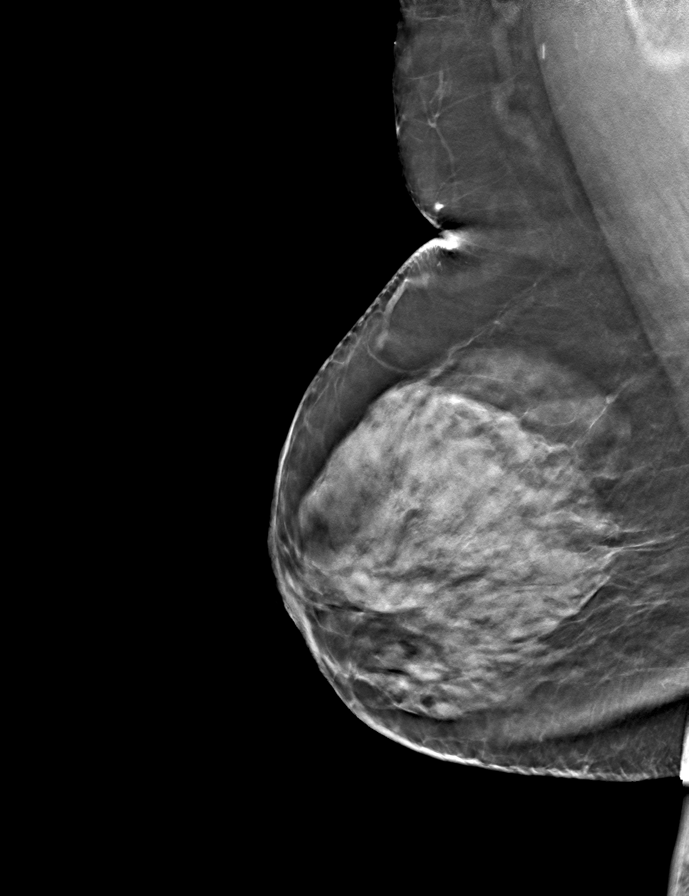

[R CC tomo · tomo slice 23/46.0]
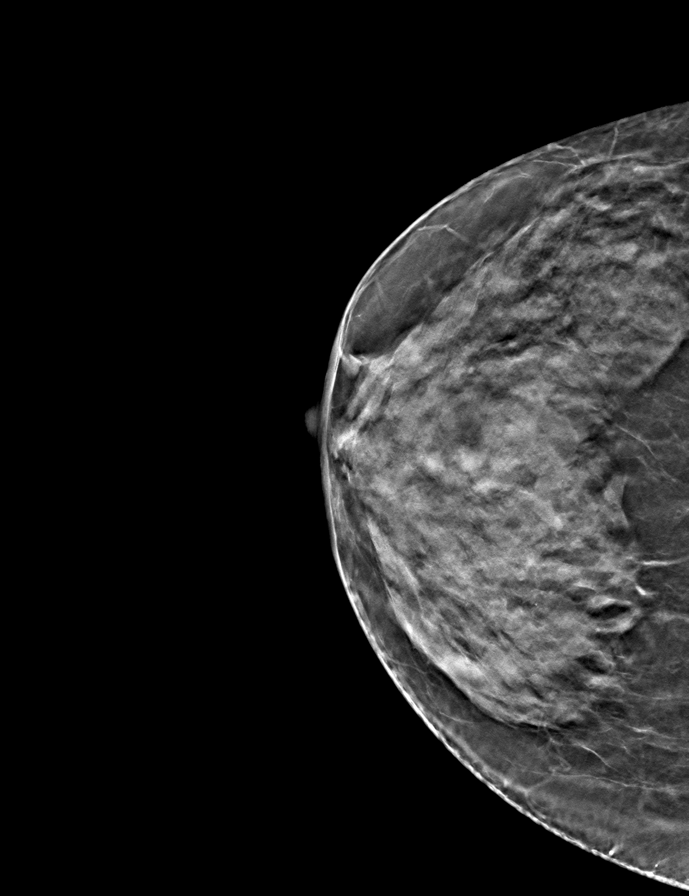

[L MLO tomo · tomo slice 33/66.0]
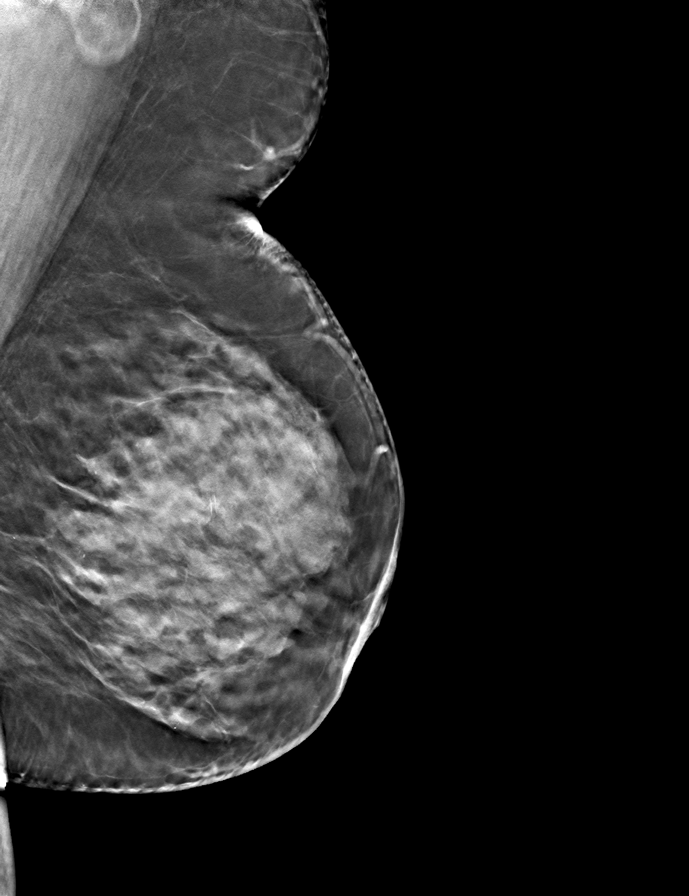

[9 of 24 positions shown; findings below may reference images not displayed]

ACR Breast Density Category d: The breast tissue is extremely dense,
which lowers the sensitivity of mammography
FINDINGS: There are no findings suspicious for malignancy.
IMPRESSION: No mammographic evidence of malignancy. A result letter of this
screening mammogram will be mailed directly to the patient.

RECOMMENDATION:
Screening mammogram in one year. (Code:TA-V-WV9)

BI-RADS CATEGORY  1: Negative.

## 2023-09-15 ENCOUNTER — Encounter: Payer: Self-pay | Admitting: Neurology

## 2023-09-23 NOTE — Progress Notes (Deleted)
 Assessment/Plan:   ***Chronic low back pain  - Patient used to follow with pain management.  Now following with primary care for pain management.  On hydrocodone .  On Lyrica .  Remote history of seizure  - Last seizure was 51.  - Patient on chronic Dilantin   - Last Dilantin  level was in 2022, but patient's primary care has ordered one along with numerous other labs that are pending for patient to have done.  Encouraged the patient to make sure that she gets those labs completed.  Subjective:   Deborah Carroll was seen today in the movement disorders clinic for neurologic consultation at the request of Luking, Jackelyn Marvel, MD.  The consultation is for the evaluation of ***.  Patient has been seen by [facility] neurology previously, although not in a few years.  Most of those notes pertaining to her history of neuropathy (felt possibly idiopathic, possibly due to Dilantin , possibly due to diabetes) and her remote history of seizure (last episode in 1992).  Tremor: {yes no:314532}   How long has it been going on? ***  At rest or with activation?  ***  When is it noted the most?  ***  Fam hx of tremor?  {yes RU:045409}  Located where?  ***  Affected by caffeine:  {yes no:314532}  Affected by alcohol:  {yes no:314532}  Affected by stress:  {yes no:314532}  Affected by fatigue:  {yes no:314532}  Spills soup if on spoon:  {yes no:314532}  Spills glass of liquid if full:  {yes no:314532}  Affects ADL's (tying shoes, brushing teeth, etc):  {yes no:314532}  Tremor inducing meds:  {yes no:314532}  Other Specific Symptoms:  Voice: *** Sleep: ***  Vivid Dreams:  {yes no:314532}  Acting out dreams:  {yes no:314532} Wet Pillows: {yes no:314532} Postural symptoms:  {yes no:314532}  Falls?  {yes no:314532} Bradykinesia symptoms: {parkinson brady:18041} Loss of smell:  {yes no:314532} Loss of taste:  {yes no:314532} Urinary Incontinence:  {yes no:314532} Difficulty Swallowing:  {yes  no:314532} Handwriting, micrographia: {yes no:314532} Trouble with ADL's:  {yes no:314532}  Trouble buttoning clothing: {yes no:314532} Depression:  {yes no:314532} Memory changes:  {yes no:314532} Hallucinations:  {yes no:314532}  visual distortions: {yes no:314532} N/V:  {yes no:314532} Lightheaded:  {yes no:314532}  Syncope: {yes no:314532} Diplopia:  {yes no:314532} Dyskinesia:  {yes no:314532}   Separately, patient does have remote history of seizure.  First convulsive seizure was in 1982, 3 to 6 months after her pregnancy.  She was started on Dilantin .  She noted some intermittent seizures when she would run out of medication or had missed dosages.  Her last event was in 1992.  Patient has been on Dilantin  ever since.  She has never wanted to trial getting off of the medication.  Patient is on Dilantin , 100 mg, 2 in the morning and 1 in the evening.  Last Dilantin  level was in 2022.  Neuroimaging of the brain has *** previously been performed.  It *** available for my review today.  PREVIOUS MEDICATIONS: {Parkinson's RX:18200}  ALLERGIES:  No Known Allergies  CURRENT MEDICATIONS:  Current Outpatient Medications  Medication Instructions   dicyclomine  (BENTYL ) 10 MG capsule Take 1 capsule by mouth three times daily as needed   hydrochlorothiazide  (HYDRODIURIL ) 25 mg, Oral, Daily   HYDROcodone -acetaminophen  (NORCO) 10-325 MG tablet 1 taken 4 times daily as needed for pain   HYDROcodone -acetaminophen  (NORCO) 10-325 MG tablet 1 taken up to 4 times daily as needed for pain   HYDROcodone -acetaminophen  (NORCO) 10-325 MG tablet  1 taken 4 times daily maximum per day as needed for pain   metFORMIN  (GLUCOPHAGE -XR) 500 MG 24 hr tablet Take 1 tablet by mouth once daily   pantoprazole  (PROTONIX ) 40 mg, Oral, Daily   phenytoin  (DILANTIN ) 100 MG ER capsule TAKE 2 CAPSULES BY MOUTH TWICE DAILY IN THE MORNING AND IN THE EVENING   Potassium Chloride  ER 20 MEQ TBCR 20 mEq, Oral, 2 times daily    pregabalin  (LYRICA ) 25 mg, Oral, 2 times daily   rosuvastatin  (CRESTOR ) 5 mg, Oral, Daily   sertraline  (ZOLOFT ) 200 mg, Oral, Daily   torsemide  (DEMADEX ) 20 mg, Oral, Daily PRN    Objective:   PHYSICAL EXAMINATION:    VITALS:  There were no vitals filed for this visit.  GEN:  The patient appears stated age and is in NAD. HEENT:  Normocephalic, atraumatic.  The mucous membranes are moist. The superficial temporal arteries are without ropiness or tenderness. CV:  RRR Lungs:  CTAB Neck/HEME:  There are no carotid bruits bilaterally.  Neurological examination:  Orientation: The patient is alert and oriented x3.  Cranial nerves: There is good facial symmetry.  Extraocular muscles are intact. The visual fields are full to confrontational testing. The speech is fluent and clear. Soft palate rises symmetrically and there is no tongue deviation. Hearing is intact to conversational tone. Sensation: Sensation is intact to light touch throughout (facial, trunk, extremities). Vibration is intact at the bilateral big toe. There is no extinction with double simultaneous stimulation.  Motor: Strength is 5/5 in the bilateral upper and lower extremities.   Shoulder shrug is equal and symmetric.  There is no pronator drift. Deep tendon reflexes: Deep tendon reflexes are 2/4 at the bilateral biceps, triceps, brachioradialis, patella and achilles. Plantar responses are downgoing bilaterally.  Movement examination: Tone: There is ***tone in the bilateral upper extremities.  The tone in the lower extremities is ***.  Abnormal movements: *** Coordination:  There is *** decremation with RAM's, *** Gait and Station: The patient has *** difficulty arising out of a deep-seated chair without the use of the hands. The patient's stride length is ***.  The patient has a *** pull test.     I have reviewed and interpreted the following labs independently   Chemistry      Component Value Date/Time   NA 143  02/26/2023 1637   K 4.0 02/26/2023 1637   CL 101 02/26/2023 1637   CO2 25 02/26/2023 1637   BUN 11 02/26/2023 1637   CREATININE 0.68 02/26/2023 1637   CREATININE 0.66 05/16/2014 1023      Component Value Date/Time   CALCIUM  9.5 02/26/2023 1637   ALKPHOS 149 (H) 02/26/2023 1637   AST 20 02/26/2023 1637   ALT 18 02/26/2023 1637   BILITOT <0.2 02/26/2023 1637      Lab Results  Component Value Date   TSH 1.760 10/17/2021   Lab Results  Component Value Date   WBC 10.8 02/26/2023   HGB 13.3 02/26/2023   HCT 39.8 02/26/2023   MCV 88 02/26/2023   PLT 184 02/26/2023   Lab Results  Component Value Date   PHENYTOIN  10.7 07/12/2020    Lab Results  Component Value Date   VITAMINB12 642 04/19/2021     Total time spent on today's visit was ***greater than 60 minutes, including both face-to-face time and nonface-to-face time.  Time included that spent on review of records (prior notes available to me/labs/imaging if pertinent), discussing treatment and goals, answering patient's questions and  coordinating care.  Cc:  Bennet Brasil, MD

## 2023-09-25 ENCOUNTER — Ambulatory Visit: Admitting: Neurology

## 2023-09-25 NOTE — Progress Notes (Unsigned)
 Assessment/Plan:   ***Chronic low back pain  - Patient used to follow with pain management.  Now following with primary care for pain management.  On hydrocodone .  On Lyrica .  Remote history of seizure  - Last seizure was 54.  - Patient on chronic Dilantin   - Last Dilantin  level was in 2022, but patient's primary care has ordered one along with numerous other labs that are pending for patient to have done.  Encouraged the patient to make sure that she gets those labs completed.  Subjective:   Deborah Carroll was seen today in the movement disorders clinic for neurologic consultation at the request of Luking, Jackelyn Marvel, MD.  The consultation is for the evaluation of ***.  Patient has been seen by [facility] neurology previously, although not in a few years.  Most of those notes pertaining to her history of neuropathy (felt possibly idiopathic, possibly due to Dilantin , possibly due to diabetes) and her remote history of seizure (last episode in 1992).  Tremor: {yes no:314532}   How long has it been going on? ***  At rest or with activation?  ***  When is it noted the most?  ***  Fam hx of tremor?  {yes RU:045409}  Located where?  ***  Affected by caffeine:  {yes no:314532}  Affected by alcohol:  {yes no:314532}  Affected by stress:  {yes no:314532}  Affected by fatigue:  {yes no:314532}  Spills soup if on spoon:  {yes no:314532}  Spills glass of liquid if full:  {yes no:314532}  Affects ADL's (tying shoes, brushing teeth, etc):  {yes no:314532}  Tremor inducing meds:  {yes no:314532}  Other Specific Symptoms:  Voice: *** Sleep: ***  Vivid Dreams:  {yes no:314532}  Acting out dreams:  {yes no:314532} Wet Pillows: {yes no:314532} Postural symptoms:  {yes no:314532}  Falls?  {yes no:314532} Bradykinesia symptoms: {parkinson brady:18041} Loss of smell:  {yes no:314532} Loss of taste:  {yes no:314532} Urinary Incontinence:  {yes no:314532} Difficulty Swallowing:  {yes  no:314532} Handwriting, micrographia: {yes no:314532} Trouble with ADL's:  {yes no:314532}  Trouble buttoning clothing: {yes no:314532} Depression:  {yes no:314532} Memory changes:  {yes no:314532} Hallucinations:  {yes no:314532}  visual distortions: {yes no:314532} N/V:  {yes no:314532} Lightheaded:  {yes no:314532}  Syncope: {yes no:314532} Diplopia:  {yes no:314532} Dyskinesia:  {yes no:314532}   Separately, patient does have remote history of seizure.  First convulsive seizure was in 1982, 3 to 6 months after her pregnancy.  She was started on Dilantin .  She noted some intermittent seizures when she would run out of medication or had missed dosages.  Her last event was in 1992.  Patient has been on Dilantin  ever since.  She has never wanted to trial getting off of the medication.  Patient is on Dilantin , 100 mg, 2 in the morning and 1 in the evening.  Last Dilantin  level was in 2022.  Neuroimaging of the brain has *** previously been performed.  It *** available for my review today.  PREVIOUS MEDICATIONS: {Parkinson's RX:18200}  ALLERGIES:  No Known Allergies  CURRENT MEDICATIONS:  Current Outpatient Medications  Medication Instructions   dicyclomine  (BENTYL ) 10 MG capsule Take 1 capsule by mouth three times daily as needed   hydrochlorothiazide  (HYDRODIURIL ) 25 mg, Oral, Daily   HYDROcodone -acetaminophen  (NORCO) 10-325 MG tablet 1 taken 4 times daily as needed for pain   HYDROcodone -acetaminophen  (NORCO) 10-325 MG tablet 1 taken up to 4 times daily as needed for pain   HYDROcodone -acetaminophen  (NORCO) 10-325 MG tablet  1 taken 4 times daily maximum per day as needed for pain   metFORMIN  (GLUCOPHAGE -XR) 500 MG 24 hr tablet Take 1 tablet by mouth once daily   pantoprazole  (PROTONIX ) 40 mg, Oral, Daily   phenytoin  (DILANTIN ) 100 MG ER capsule TAKE 2 CAPSULES BY MOUTH TWICE DAILY IN THE MORNING AND IN THE EVENING   Potassium Chloride  ER 20 MEQ TBCR 20 mEq, Oral, 2 times daily    pregabalin  (LYRICA ) 25 mg, Oral, 2 times daily   rosuvastatin  (CRESTOR ) 5 mg, Oral, Daily   sertraline  (ZOLOFT ) 200 mg, Oral, Daily   torsemide  (DEMADEX ) 20 mg, Oral, Daily PRN    Objective:   PHYSICAL EXAMINATION:    VITALS:  There were no vitals filed for this visit.  GEN:  The patient appears stated age and is in NAD. HEENT:  Normocephalic, atraumatic.  The mucous membranes are moist. The superficial temporal arteries are without ropiness or tenderness. CV:  RRR Lungs:  CTAB Neck/HEME:  There are no carotid bruits bilaterally.  Neurological examination:  Orientation: The patient is alert and oriented x3.  Cranial nerves: There is good facial symmetry.  Extraocular muscles are intact. The visual fields are full to confrontational testing. The speech is fluent and clear. Soft palate rises symmetrically and there is no tongue deviation. Hearing is intact to conversational tone. Sensation: Sensation is intact to light touch throughout (facial, trunk, extremities). Vibration is intact at the bilateral big toe. There is no extinction with double simultaneous stimulation.  Motor: Strength is 5/5 in the bilateral upper and lower extremities.   Shoulder shrug is equal and symmetric.  There is no pronator drift. Deep tendon reflexes: Deep tendon reflexes are 2/4 at the bilateral biceps, triceps, brachioradialis, patella and achilles. Plantar responses are downgoing bilaterally.  Movement examination: Tone: There is ***tone in the bilateral upper extremities.  The tone in the lower extremities is ***.  Abnormal movements: *** Coordination:  There is *** decremation with RAM's, *** Gait and Station: The patient has *** difficulty arising out of a deep-seated chair without the use of the hands. The patient's stride length is ***.  The patient has a *** pull test.     I have reviewed and interpreted the following labs independently   Chemistry      Component Value Date/Time   NA 143  02/26/2023 1637   K 4.0 02/26/2023 1637   CL 101 02/26/2023 1637   CO2 25 02/26/2023 1637   BUN 11 02/26/2023 1637   CREATININE 0.68 02/26/2023 1637   CREATININE 0.66 05/16/2014 1023      Component Value Date/Time   CALCIUM  9.5 02/26/2023 1637   ALKPHOS 149 (H) 02/26/2023 1637   AST 20 02/26/2023 1637   ALT 18 02/26/2023 1637   BILITOT <0.2 02/26/2023 1637      Lab Results  Component Value Date   TSH 1.760 10/17/2021   Lab Results  Component Value Date   WBC 10.8 02/26/2023   HGB 13.3 02/26/2023   HCT 39.8 02/26/2023   MCV 88 02/26/2023   PLT 184 02/26/2023   Lab Results  Component Value Date   PHENYTOIN  10.7 07/12/2020    Lab Results  Component Value Date   VITAMINB12 642 04/19/2021     Total time spent on today's visit was ***greater than 60 minutes, including both face-to-face time and nonface-to-face time.  Time included that spent on review of records (prior notes available to me/labs/imaging if pertinent), discussing treatment and goals, answering patient's questions and  coordinating care.  Cc:  Bennet Brasil, MD

## 2023-10-02 ENCOUNTER — Ambulatory Visit: Admitting: Neurology

## 2023-10-02 ENCOUNTER — Encounter: Payer: Self-pay | Admitting: Neurology

## 2023-10-02 VITALS — BP 142/70 | HR 63 | Ht 62.0 in | Wt 239.0 lb

## 2023-10-02 DIAGNOSIS — M545 Low back pain, unspecified: Secondary | ICD-10-CM

## 2023-10-02 DIAGNOSIS — G8929 Other chronic pain: Secondary | ICD-10-CM

## 2023-10-02 DIAGNOSIS — G250 Essential tremor: Secondary | ICD-10-CM

## 2023-10-02 DIAGNOSIS — G40909 Epilepsy, unspecified, not intractable, without status epilepticus: Secondary | ICD-10-CM | POA: Diagnosis not present

## 2023-10-02 NOTE — Patient Instructions (Addendum)
 Start primidone 50 mg - 1/2 tablet at bedtime for 1 week and then increase to 1 tablet at bedtime x 1 week and then 1 tablet twice per day thereafter.  Essential tremor is a common movement disorder that causes shaking, most often in both hands and arms, especially when using them for activities like writing, eating, or holding objects. It can also affect the head, voice, or other parts of the body. Essential tremor usually gets worse slowly over time and can run in families.[2-3][5][7] Symptoms:  Shaking (tremor) in both hands and arms when moving or holding them up  Sometimes tremor in the head, voice, or legs  Tremor that is not present when the body is at rest  Tremor that may get worse with stress, tiredness, or caffeine Diagnosis: Doctors diagnose essential tremor by asking about symptoms, family history, and doing a physical exam. Blood tests may be done to rule out other causes of tremor, such as thyroid  problems or medication side effects.[3] Treatment: Not everyone with essential tremor needs treatment. If the tremor is mild and does not interfere with daily life, no treatment may be needed. If the tremor makes daily activities difficult, treatment options include:  Medications:  Propranolol  (a beta-blocker) and primidone (an anti-seizure medicine) are the most commonly used and effective medicines. The American Academy of Neurology recommends these as first choices. Propranolol  is usually started at a low dose and increased as needed, up to 120-240 mg per day. Primidone is also started at a low dose and increased slowly, up to 250-750 mg per day, depending on how well it works and side effects.[1][3][6-8]  Other medicines that may help include topiramate, gabapentin, and some anti-anxiety medicines, but these are usually tried if the first options do not work or cause side effects.[1][4][6][9]  Procedures:  If medicines do not help and the tremor is severe, procedures like deep brain  stimulation (DBS) or focused ultrasound may be considered. DBS involves placing a small device in the brain to help control tremor. Focused ultrasound uses sound waves to target a small area in the brain without surgery. These options are usually for people with very disabling tremor who do not get enough relief from medicines.[2-9] Living with Essential Tremor:  Using heavier cups and utensils, wearing wrist weights, or using special pens can make daily tasks easier.  Avoiding caffeine and getting enough sleep may help reduce tremor.  Some people notice that a small amount of alcohol can temporarily reduce tremor, but this is not recommended as a regular treatment. Essential tremor is not dangerous, but it can affect quality of life. Talk to your doctor about the best treatment options for you and let them know if your symptoms change or get worse.[2-5][7]

## 2023-10-04 ENCOUNTER — Other Ambulatory Visit: Payer: Self-pay | Admitting: Family Medicine

## 2023-10-16 ENCOUNTER — Encounter: Payer: Self-pay | Admitting: Family Medicine

## 2023-11-17 ENCOUNTER — Other Ambulatory Visit: Payer: Self-pay | Admitting: Family Medicine

## 2023-11-20 ENCOUNTER — Other Ambulatory Visit: Payer: Self-pay | Admitting: Family Medicine

## 2023-11-20 ENCOUNTER — Encounter: Payer: Self-pay | Admitting: Family Medicine

## 2023-11-20 MED ORDER — PREGABALIN 50 MG PO CAPS: 50.0000 mg | ORAL_CAPSULE | Freq: Two times a day (BID) | ORAL | 5 refills | Status: DC

## 2023-11-27 ENCOUNTER — Other Ambulatory Visit: Payer: Self-pay

## 2023-11-27 ENCOUNTER — Ambulatory Visit (INDEPENDENT_AMBULATORY_CARE_PROVIDER_SITE_OTHER): Admitting: Family Medicine

## 2023-11-27 VITALS — BP 132/70 | HR 56 | Temp 97.0°F | Ht 62.0 in | Wt 232.2 lb

## 2023-11-27 DIAGNOSIS — E538 Deficiency of other specified B group vitamins: Secondary | ICD-10-CM | POA: Diagnosis not present

## 2023-11-27 DIAGNOSIS — R5383 Other fatigue: Secondary | ICD-10-CM

## 2023-11-27 DIAGNOSIS — G609 Hereditary and idiopathic neuropathy, unspecified: Secondary | ICD-10-CM

## 2023-11-27 DIAGNOSIS — Z7984 Long term (current) use of oral hypoglycemic drugs: Secondary | ICD-10-CM

## 2023-11-27 DIAGNOSIS — E785 Hyperlipidemia, unspecified: Secondary | ICD-10-CM | POA: Diagnosis not present

## 2023-11-27 DIAGNOSIS — R251 Tremor, unspecified: Secondary | ICD-10-CM

## 2023-11-27 DIAGNOSIS — Z79891 Long term (current) use of opiate analgesic: Secondary | ICD-10-CM | POA: Diagnosis not present

## 2023-11-27 DIAGNOSIS — E1169 Type 2 diabetes mellitus with other specified complication: Secondary | ICD-10-CM | POA: Diagnosis not present

## 2023-11-27 DIAGNOSIS — Z79899 Other long term (current) drug therapy: Secondary | ICD-10-CM | POA: Diagnosis not present

## 2023-11-27 DIAGNOSIS — G40909 Epilepsy, unspecified, not intractable, without status epilepticus: Secondary | ICD-10-CM | POA: Diagnosis not present

## 2023-11-27 DIAGNOSIS — R768 Other specified abnormal immunological findings in serum: Secondary | ICD-10-CM | POA: Diagnosis not present

## 2023-11-27 DIAGNOSIS — E119 Type 2 diabetes mellitus without complications: Secondary | ICD-10-CM

## 2023-11-27 NOTE — Progress Notes (Signed)
 Subjective:    Patient ID: Deborah Carroll, female    DOB: [birth year], 68 y.o.   MRN: [id number]  HPI  This patient was seen today for chronic pain  The medication list was reviewed and updated.   Location of Pain for which the patient has been treated with regarding narcotics: Lumbar pain sciatica neuropathy  Onset of this pain: Present for years   -Compliance with medication: Good compliance  - Number patient states they take daily: 4 tablets a day  -Reason for ongoing use of opioids cannot get adequate relief with Lyrica  Tylenol   What other measures have been tried outside of opioids has seen specialist  In the ongoing specialists regarding this condition none currently  -when was the last dose patient took?  Within the past 24 hours  The patient was advised the importance of maintaining medication and not using illegal substances with these.  Here for refills and follow up  The patient was educated that we can provide 3 monthly scripts for their medication, it is their responsibility to follow the instructions.  Side effects or complications from medications: Denies side effects  Patient is aware that pain medications are meant to minimize the severity of the pain to allow their pain levels to improve to allow for better function. They are aware of that pain medications cannot totally remove their pain.  Due for UDT ( at least once per year) (pain management contract is also completed at the time of the UDT): February 2025  Scale of 1 to 10 ( 1 is least 10 is most) Your pain level without the medicine: 8-10 Your pain level with medication 2-3  Scale 1 to 10 ( 1-helps very little, 10 helps very well) How well does your pain medication reduce your pain so you can function better through out the day?  3-4  Quality of the pain: Throbbing aching  Persistence of the pain: Present all time  Modifying factors: Worse with activity  Patient has underlying seizure  disorder she is doing a good job taking her medications.  Has not had any seizures  She has diabetes tries to watch diet takes medicine.  Weight is gone up.  Energy level lower.  At higher risk for heart disease and stroke associated with her problems  Hyperlipidemia takes her medicine regular basis tries stay healthy     Review of Systems     Objective:   Physical Exam  General-in no acute distress Eyes-no discharge Lungs-respiratory rate normal, CTA CV-no murmurs,RRR Extremities skin warm dry no edema Neuro grossly normal Behavior normal, alert       Assessment & Plan:  1. Seizure disorder (HCC) (Primary) Continue medication no seizures recently  2. Other fatigue Multifactorial between weight gain and activity due to chronic pain and neuropathy causing balance issues  3. Hyperlipidemia associated with type 2 diabetes mellitus (HCC) Try to keep LDL below 70 when possible  4. Tremor Primidone is not a reasonable medicine due to the fact she is on Lyrica  I have encouraged her to make sure she is taking propranolol  twice a day  5. Idiopathic peripheral neuropathy Severe the Lyrica  she is utilizing twice daily 50 mg should give us  feedback in a month's time on if that is doing enough if not we will need to bump up the dose  6. Diabetes mellitus without complication (HCC) She needs up-to-date lab work If A1c in a moderate or elevated range we did discuss GLP-1's and she would be a  good candidate She is already on medication try to watch her diet But she is at high risk for heart attack strokes and other issues because of morbid obesity along with diabetes  7. High risk medication use Labs pending  8. Morbid obesity (HCC) Portion control regular physical activity  9. Encounter for long-term opiate analgesic use The patient was seen in followup for chronic pain. A review over at their current pain status was discussed. Drug registry was checked. Prescriptions were  given.  Regular follow-up recommended. Discussion was held regarding the importance of compliance with medication as well as pain medication contract.  Patient was informed that medication may cause drowsiness and should not be combined  with other medications/alcohol or street drugs. If the patient feels medication is causing altered alertness then do not drive or operate dangerous equipment.  Should be noted that the patient appears to be meeting appropriate use of opioids and response.  Evidenced by improved function and decent pain control without significant side effects and no evidence of overt aberrancy issues.  Upon discussion with the patient today they understand that opioid therapy is optional and they feel that the pain has been refractory to reasonable conservative measures and is significant and affecting quality of life enough to warrant ongoing therapy and wishes to continue opioids.  Refills were provided.  [place]  medical Board guidelines regarding the pain medicine has been reviewed.  CDC guidelines most updated 2022 has been reviewed by the prescriber.  PDMP is checked on a regular basis yearly urine drug screen and pain management contract  Treatment plan for this patient includes #1-gentle stretching exercises as shown daily basis 2.  Mild strength exercises 3 times per week #3 continue pain medications #4 notify us  if any digression Continue pain medicine no more than 4/day  Follow-up 3 months

## 2023-11-28 ENCOUNTER — Other Ambulatory Visit: Payer: Self-pay | Admitting: Family Medicine

## 2023-11-30 MED ORDER — HYDROCODONE-ACETAMINOPHEN 10-325 MG PO TABS: ORAL_TABLET | ORAL | 0 refills | Status: DC

## 2023-12-02 ENCOUNTER — Ambulatory Visit: Payer: Self-pay | Admitting: Family Medicine

## 2023-12-02 ENCOUNTER — Other Ambulatory Visit: Payer: Self-pay | Admitting: Family Medicine

## 2023-12-02 LAB — IMMUNOFIXATION, SERUM
IgA/Immunoglobulin A, Serum: 429 mg/dL — ABNORMAL HIGH (ref 87–352)
IgG (Immunoglobin G), Serum: 1061 mg/dL (ref 586–1602)
IgM (Immunoglobulin M), Srm: 171 mg/dL (ref 26–217)

## 2023-12-02 LAB — HEPATIC FUNCTION PANEL
ALT: 31 IU/L (ref 0–32)
AST: 39 IU/L (ref 0–40)
Albumin: 4.3 g/dL (ref 3.9–4.9)
Alkaline Phosphatase: 129 IU/L — ABNORMAL HIGH (ref 44–121)
Bilirubin Total: 0.4 mg/dL (ref 0.0–1.2)
Bilirubin, Direct: 0.16 mg/dL (ref 0.00–0.40)

## 2023-12-02 LAB — CBC WITH DIFFERENTIAL/PLATELET
Basophils Absolute: 0 x10E3/uL (ref 0.0–0.2)
Basos: 0 %
EOS (ABSOLUTE): 0.2 x10E3/uL (ref 0.0–0.4)
Eos: 2 %
Hematocrit: 40.2 % (ref 34.0–46.6)
Hemoglobin: 13.1 g/dL (ref 11.1–15.9)
Immature Grans (Abs): 0.1 x10E3/uL (ref 0.0–0.1)
Immature Granulocytes: 1 %
Lymphocytes Absolute: 2.9 x10E3/uL (ref 0.7–3.1)
Lymphs: 31 %
MCH: 29.4 pg (ref 26.6–33.0)
MCHC: 32.6 g/dL (ref 31.5–35.7)
MCV: 90 fL (ref 79–97)
Monocytes Absolute: 0.6 x10E3/uL (ref 0.1–0.9)
Monocytes: 6 %
Neutrophils Absolute: 5.7 x10E3/uL (ref 1.4–7.0)
Neutrophils: 60 %
Platelets: 159 x10E3/uL (ref 150–450)
RBC: 4.45 x10E6/uL (ref 3.77–5.28)
RDW: 13.6 % (ref 11.7–15.4)
WBC: 9.5 x10E3/uL (ref 3.4–10.8)

## 2023-12-02 LAB — LIPID PANEL
Chol/HDL Ratio: 2.7 ratio (ref 0.0–4.4)
Cholesterol, Total: 143 mg/dL (ref 100–199)
HDL: 53 mg/dL (ref >39)
LDL Chol Calc (NIH): 68 mg/dL (ref 0–99)
Triglycerides: 125 mg/dL (ref 0–149)
VLDL Cholesterol Cal: 22 mg/dL (ref 5–40)

## 2023-12-02 LAB — KAPPA/LAMBDA LIGHT CHAINS
Ig Kappa Free Light Chain: 22.6 mg/L — ABNORMAL HIGH (ref 3.3–19.4)
Ig Lambda Free Light Chain: 26.8 mg/L — ABNORMAL HIGH (ref 5.7–26.3)
KAPPA/LAMBDA RATIO: 0.84 (ref 0.26–1.65)

## 2023-12-02 LAB — BASIC METABOLIC PANEL WITH GFR
BUN/Creatinine Ratio: 14 (ref 12–28)
BUN: 9 mg/dL (ref 8–27)
CO2: 25 mmol/L (ref 20–29)
Calcium: 9.3 mg/dL (ref 8.7–10.3)
Chloride: 102 mmol/L (ref 96–106)
Creatinine, Ser: 0.66 mg/dL (ref 0.57–1.00)
Glucose: 141 mg/dL — ABNORMAL HIGH (ref 70–99)
Potassium: 4.5 mmol/L (ref 3.5–5.2)
Sodium: 143 mmol/L (ref 134–144)
eGFR: 96 mL/min/1.73 (ref >59)

## 2023-12-02 LAB — HEMOGLOBIN A1C
Est. average glucose Bld gHb Est-mCnc: 143 mg/dL
Hgb A1c MFr Bld: 6.6 % — ABNORMAL HIGH (ref 4.8–5.6)

## 2023-12-02 LAB — PROTEIN ELECTROPHORESIS, SERUM
A/G Ratio: 1.1 (ref 0.7–1.7)
Albumin ELP: 3.8 g/dL (ref 2.9–4.4)
Alpha 1: 0.3 g/dL (ref 0.0–0.4)
Alpha 2: 0.9 g/dL (ref 0.4–1.0)
Beta: 1.2 g/dL (ref 0.7–1.3)
Gamma Globulin: 1.1 g/dL (ref 0.4–1.8)
Globulin, Total: 3.4 g/dL (ref 2.2–3.9)
Total Protein: 7.2 g/dL (ref 6.0–8.5)

## 2023-12-02 LAB — PHENYTOIN LEVEL, TOTAL: Phenytoin (Dilantin), Serum: 14.3 ug/mL (ref 10.0–20.0)

## 2023-12-02 LAB — VITAMIN B12: Vitamin B-12: 693 pg/mL (ref 232–1245)

## 2023-12-04 ENCOUNTER — Other Ambulatory Visit: Payer: Self-pay

## 2023-12-04 DIAGNOSIS — R768 Other specified abnormal immunological findings in serum: Secondary | ICD-10-CM

## 2023-12-04 DIAGNOSIS — E119 Type 2 diabetes mellitus without complications: Secondary | ICD-10-CM

## 2023-12-04 MED ORDER — TIRZEPATIDE 2.5 MG/0.5ML ~~LOC~~ SOAJ: 2.5000 mg | SUBCUTANEOUS | 4 refills | Status: DC

## 2023-12-09 ENCOUNTER — Telehealth: Payer: Self-pay | Admitting: Pharmacy Technician

## 2023-12-09 ENCOUNTER — Other Ambulatory Visit (HOSPITAL_COMMUNITY): Payer: Self-pay

## 2023-12-09 NOTE — Telephone Encounter (Signed)
 Pharmacy Patient Advocate Encounter   Received notification from CoverMyMeds that prior authorization for Mounjaro  2.5mg /0.42ml auto-injectors is required/requested.   Insurance verification completed.   The patient is insured through [facility] ADVANTAGE/RX ADVANCE .   Per test claim: PA required; PA submitted to above mentioned insurance via Latent Key/confirmation #/EOC [id number] Status is pending

## 2023-12-10 ENCOUNTER — Other Ambulatory Visit: Payer: Self-pay | Admitting: Family Medicine

## 2023-12-10 ENCOUNTER — Other Ambulatory Visit (HOSPITAL_COMMUNITY): Payer: Self-pay

## 2023-12-10 NOTE — Telephone Encounter (Signed)
 Pharmacy Patient Advocate Encounter  Received notification from [facility] ADVANTAGE/RX ADVANCE that Prior Authorization for Mounjaro  2.5mg /0.43ml auto-injectors has been APPROVED from 12/09/2023 to 12/08/2024. Ran test claim, Copay is $47.00. This test claim was processed through [facility]- copay amounts may vary at other pharmacies due to pharmacy/plan contracts, or as the patient moves through the different stages of their insurance plan.   PA #/Case ID/Reference #: [id number]

## 2023-12-12 ENCOUNTER — Other Ambulatory Visit

## 2023-12-12 ENCOUNTER — Encounter: Payer: Self-pay | Admitting: Oncology

## 2023-12-12 ENCOUNTER — Inpatient Hospital Stay: Attending: Oncology | Admitting: Oncology

## 2023-12-12 VITALS — BP 165/94 | HR 57 | Temp 98.2°F | Resp 16 | Ht 62.0 in | Wt 237.0 lb

## 2023-12-12 DIAGNOSIS — R5383 Other fatigue: Secondary | ICD-10-CM | POA: Insufficient documentation

## 2023-12-12 DIAGNOSIS — Z87891 Personal history of nicotine dependence: Secondary | ICD-10-CM | POA: Diagnosis not present

## 2023-12-12 DIAGNOSIS — R768 Other specified abnormal immunological findings in serum: Secondary | ICD-10-CM | POA: Diagnosis not present

## 2023-12-12 DIAGNOSIS — R61 Generalized hyperhidrosis: Secondary | ICD-10-CM | POA: Insufficient documentation

## 2023-12-12 DIAGNOSIS — I10 Essential (primary) hypertension: Secondary | ICD-10-CM | POA: Insufficient documentation

## 2023-12-12 DIAGNOSIS — R110 Nausea: Secondary | ICD-10-CM | POA: Diagnosis not present

## 2023-12-12 DIAGNOSIS — R634 Abnormal weight loss: Secondary | ICD-10-CM | POA: Diagnosis not present

## 2023-12-12 DIAGNOSIS — E1140 Type 2 diabetes mellitus with diabetic neuropathy, unspecified: Secondary | ICD-10-CM | POA: Insufficient documentation

## 2023-12-12 DIAGNOSIS — Z7984 Long term (current) use of oral hypoglycemic drugs: Secondary | ICD-10-CM | POA: Diagnosis not present

## 2023-12-12 NOTE — Progress Notes (Signed)
 [facility]  [phone] [phone]    INITIAL HEMATOLOGY CONSULTATION  Referring MD:  Dr. Glendia Fielding  Reason for Referral: Elevated serum light chains  HPI: Deborah Carroll is a 68 year old female who is seen today for initial evaluation for elevated serum light chains.  She underwent lab work on 11/27/2023 which showed a normal CBC including a WBC of 9.5, hemoglobin 13.1, MCV 90, platelets 159,000, creatinine 0.66, GFR 96, calcium  9.3, vitamin B12 693, serum protein electrophoresis shows showed no M spike and immunofixation showed a mildly elevated IgA level of 429 with a faint band noted in IgA and light chain specificity could not be verified.  Serum light chains showed a mildly elevated kappa free light chain of 22.6 and mildly elevated lambda free light chain of 26.8 with a normal kappa/lambda ratio of 0.84.  Light chains have been checked intermittently in the past and the patient has had a persistently elevated kappa free light chain dating back to April 2024.  The kappa/lambda ratio has always been persistently normal.  The patient presents to the office today accompanied by her husband who contributes to her history.  The patient reports that her energy level has been decreased.  Her appetite is overall okay and no unintentional weight loss.  She is not having any fevers, chills.  Reports occasional night sweats. Denies chest pain, shortness of breath, abdominal pain.  Has intermittent nausea that comes and goes.  She does have neuropathy in her feet up to her shins as well as the fingertips.  She is currently on Lyrica .  Denies bleeding.   Past Medical History:  Diagnosis Date   Anxiety    Chronic pain    Depression    Diabetes mellitus without complication (HCC)    GERD (gastroesophageal reflux disease)    Hypertension    Morbid obesity (HCC) 03/20/2018   Neuropathy    Peripheral neuropathy    Peripheral neuropathy    Seizures (HCC)    last  one 1992- Increased BP with delivery and is on meds still.   Sleep apnea    CPAP x 15 years  :   Past Surgical History:  Procedure Laterality Date   ABDOMINAL HYSTERECTOMY     ovaries present, no cancer,approx 2001   BACK SURGERY     BIOPSY  05/23/2020   Procedure: BIOPSY;  Surgeon: Cindie Carlin POUR, DO;  Location: [facility];  Service: Endoscopy;;   BREAST BIOPSY Left 08/19/2006   Benign calcifications and sclerotic lobules   CARPAL TUNNEL RELEASE     Bilateral   CATARACT EXTRACTION W/PHACO Right 06/20/2015   Procedure: CATARACT EXTRACTION PHACO AND INTRAOCULAR LENS PLACEMENT (IOC);  Surgeon: Oneil Platts, MD;  Location: [facility];  Service: Ophthalmology;  Laterality: Right;  CDE:6.25   CATARACT EXTRACTION W/PHACO Left 08/29/2015   Procedure: CATARACT EXTRACTION PHACO AND INTRAOCULAR LENS PLACEMENT (IOC);  Surgeon: Oneil Platts, MD;  Location: [facility];  Service: Ophthalmology;  Laterality: Left;  CDE: 4.11   CESAREAN SECTION     x2   CHOLECYSTECTOMY     COLONOSCOPY  10/14/2002   WLM:Lorzmjupnw involving descending colon, splenic flexure and distal transverse colon with most extensive inflammation in the splenic flexure area.  These changes are typical of ischemic colitis.  Biopsy taken.   COLONOSCOPY N/A 08/04/2012   SLF: POLYPOID LESION IN AT THE APPENDICEAL ORIFICE/Polyp  in the descending colon/Moderate melanosis throughout the entire examined colon/ The colon IS redundant. small tubular adenoma.  COLONOSCOPY WITH PROPOFOL  N/A 11/17/2018   3 tubular adenomas removed, 3 hyperplastic polyps, next colonoscopy in 11/2021.    COLONOSCOPY WITH PROPOFOL  N/A 05/09/2023   Procedure: COLONOSCOPY WITH PROPOFOL ;  Surgeon: Cindie Carlin POUR, DO;  [facility]: [facility];  Service: Endoscopy;  Laterality: N/A;  730am, asa 3   ESOPHAGOGASTRODUODENOSCOPY (EGD) WITH PROPOFOL  N/A 05/23/2020   Procedure: ESOPHAGOGASTRODUODENOSCOPY (EGD) WITH PROPOFOL ;  Surgeon: Cindie Carlin POUR, DO;  [facility];  Service: Endoscopy;  Laterality: N/A;  11:45am   FOOT SURGERY Bilateral 2004   hammer toe repairs   PARS PLANA VITRECTOMY W/ REPAIR OF MACULAR HOLE     POLYPECTOMY  11/17/2018   Procedure: POLYPECTOMY;  Surgeon: Harvey Margo CROME, MD;  [facility]: [facility];  Service: Endoscopy;;   POLYPECTOMY  05/23/2020   Procedure: POLYPECTOMY;  Surgeon: Cindie Carlin POUR, DO;  [facility];  Service: Endoscopy;;   POLYPECTOMY  05/09/2023   Procedure: POLYPECTOMY INTESTINAL;  Surgeon: Cindie Carlin POUR, DO;  [facility]: [facility];  Service: Endoscopy;;  :   CURRENT MEDS: Current Outpatient Medications  Medication Sig Dispense Refill   dicyclomine  (BENTYL ) 10 MG capsule Take 1 capsule by mouth three times daily as needed 90 capsule 0   hydrochlorothiazide  (HYDRODIURIL ) 25 MG tablet Take 1 tablet (25 mg total) by mouth daily. 360 tablet 0   HYDROcodone -acetaminophen  (NORCO) 10-325 MG tablet 1 taken up to 4 times daily as needed for pain 120 tablet 0   HYDROcodone -acetaminophen  (NORCO) 10-325 MG tablet 1 taken 4 times daily maximum per day as needed for pain 120 tablet 0   HYDROcodone -acetaminophen  (NORCO) 10-325 MG tablet 1 taken 4 times daily as needed for pain 120 tablet 0   metFORMIN  (GLUCOPHAGE -XR) 500 MG 24 hr tablet Take 1 tablet by mouth once daily 90 tablet 2   pantoprazole  (PROTONIX ) 40 MG tablet Take 1 tablet (40 mg total) by mouth daily. 90 tablet 2   phenytoin  (DILANTIN ) 100 MG ER capsule TAKE 2 CAPSULES BY MOUTH TWICE DAILY IN THE MORNING AND IN THE EVENING 360 capsule 2   Potassium Chloride  ER 20 MEQ TBCR Take 1 tablet by mouth twice daily 180 tablet 0   pregabalin  (LYRICA ) 50 MG capsule Take 1 capsule (50 mg total) by mouth 2 (two) times daily. 60 capsule 5   propranolol  (INDERAL ) 10 MG tablet Take 1 tablet by mouth twice daily 60 tablet 0   rosuvastatin  (CRESTOR ) 5 MG tablet Take 1 tablet by mouth once daily 90 tablet 3   sertraline  (ZOLOFT ) 100 MG tablet Take 2  tablets (200 mg total) by mouth daily. 180 tablet 1   torsemide  (DEMADEX ) 20 MG tablet Take 1 tablet (20 mg total) by mouth daily as needed (fluid or swelling). 30 tablet 2   tirzepatide  (MOUNJARO ) 2.5 MG/0.5ML Pen Inject 2.5 mg into the skin once a week. (Patient not taking: Reported on 12/12/2023) 2 mL 4   No current facility-administered medications for this visit.    No Known Allergies:   Family History  Problem Relation Age of Onset   Colon polyps Mother 53       benign    Tremor Mother    Emphysema Father    Asthma Father    Healthy Brother    Healthy Brother    Hypertension Maternal Grandmother    Heart attack Maternal Grandfather    Healthy Daughter    Healthy Daughter    Colon cancer Neg Hx    Liver disease  Neg Hx   :   Social History   Socioeconomic History   Marital status: Married    Spouse name: Not on file   Number of children: 2   Years of education: 12   Highest education level: 12th grade  Occupational History   Occupation: disability  Tobacco Use   Smoking status: Former    Types: Cigarettes    Start date: 1980    Quit date: 1976    Years since quitting: 49.7   Smokeless tobacco: Never   Tobacco comments:    smoked x 4 years many years ago  Vaping Use   Vaping status: Never Used  Substance and Sexual Activity   Alcohol use: Not Currently   Drug use: No   Sexual activity: Yes    Birth control/protection: Surgical    Comment: hyst  Other Topics Concern   Not on file  Social History Narrative   lives with husband   Right handed    Social Drivers of Health   Financial Resource Strain: Low Risk  (05/31/2023)   Overall Financial Resource Strain (CARDIA)    Difficulty of Paying Living Expenses: Not hard at all  Food Insecurity: No Food Insecurity (12/12/2023)   Hunger Vital Sign    Worried About Running Out of Food in the Last Year: Never true    Ran Out of Food in the Last Year: Never true  Transportation Needs: No Transportation Needs  (12/12/2023)   PRAPARE - Administrator, Civil Service (Medical): No    Lack of Transportation (Non-Medical): No  Physical Activity: Insufficiently Active (05/31/2023)   Exercise Vital Sign    Days of Exercise per Week: 1 day    Minutes of Exercise per Session: 20 min  Stress: Stress Concern Present (05/31/2023)   [employer] of Occupational Health - Occupational Stress Questionnaire    Feeling of Stress : To some extent  Social Connections: Socially Integrated (05/31/2023)   Social Connection and Isolation Panel    Frequency of Communication with Friends and Family: More than three times a week    Frequency of Social Gatherings with Friends and Family: More than three times a week    Attends Religious Services: More than 4 times per year    Active Member of [employer] or Organizations: Yes    Attends Engineer, structural: More than 4 times per year    Marital Status: Married  Catering manager Violence: Not At Risk (12/12/2023)   Humiliation, Afraid, Rape, and Kick questionnaire    Fear of Current or Ex-Partner: No    Emotionally Abused: No    Physically Abused: No    Sexually Abused: No  :  REVIEW OF SYSTEMS:  A comprehensive 14 point review of systems was negative except as noted in the HPI.    Exam: BP (!) 165/94 (BP Location: Left Wrist, Patient Position: Sitting)   Pulse (!) 57   Temp 98.2 F (36.8 C) (Oral)   Resp 16   Ht 5' 2 (1.575 m)   Wt 107.5 kg (236 lb 15.9 oz)   SpO2 93%   BMI 43.35 kg/m    Physical Exam Vitals reviewed.  Constitutional:      General: She is not in acute distress. HENT:     Head: Normocephalic.  Eyes:     General: No scleral icterus.    Conjunctiva/sclera: Conjunctivae normal.  Cardiovascular:     Rate and Rhythm: Normal rate and regular rhythm.     Heart  sounds: No murmur heard. Pulmonary:     Effort: Pulmonary effort is normal. No respiratory distress.     Breath sounds: Normal breath sounds.  Abdominal:      General: Bowel sounds are normal. There is no distension.     Palpations: Abdomen is soft.  Musculoskeletal:     Right lower leg: No edema.     Left lower leg: No edema.  Lymphadenopathy:     Cervical: No cervical adenopathy.  Skin:    General: Skin is warm and dry.  Neurological:     Mental Status: She is alert and oriented to person, place, and time.  Psychiatric:        Mood and Affect: Mood normal.        Behavior: Behavior normal.        Thought Content: Thought content normal.        Judgment: Judgment normal.     LABS:  Lab Results  Component Value Date   WBC 9.5 11/27/2023   HGB 13.1 11/27/2023   HCT 40.2 11/27/2023   PLT 159 11/27/2023   GLUCOSE 141 (H) 11/27/2023   CHOL 143 11/27/2023   TRIG 125 11/27/2023   HDL 53 11/27/2023   LDLCALC 68 11/27/2023   ALT 31 11/27/2023   AST 39 11/27/2023   NA 143 11/27/2023   K 4.5 11/27/2023   CL 102 11/27/2023   CREATININE 0.66 11/27/2023   BUN 9 11/27/2023   CO2 25 11/27/2023   HGBA1C 6.6 (H) 11/27/2023    No results found.   ASSESSMENT AND PLAN:  1.  Elevated serum light chains: The patient has had an elevated serum kappa light chain for over a year and more recently developed a mildly elevated serum lambda light chain.  The kappa/lambda ratio is normal.  SPEP shows no M spike.  Immunofixation shows a mildly elevated IgA level and light chain specificity could not be verified.  She has no anemia, hypercalcemia, renal dysfunction.  Today, we discussed that overall this pattern is nonspecific and not diagnostic of an underlying plasma cell disorder.  For completeness, recommend checking a 24-hour UPEP/UIFE/light chains and rechecking SPEP, IFE, light chains in 3 to 4 months.  2.  Neuropathy: The patient has longstanding neuropathy in her feet/legs and fingertips.  Etiology is unclear.  Monitor.   Thank you for this referral.  Zakhai Meisinger, DNP, AGPCNP-BC, AOCNP

## 2023-12-19 ENCOUNTER — Other Ambulatory Visit (HOSPITAL_COMMUNITY): Payer: Self-pay

## 2023-12-19 ENCOUNTER — Ambulatory Visit: Payer: PPO

## 2023-12-19 VITALS — Ht 62.0 in | Wt 236.0 lb

## 2023-12-19 DIAGNOSIS — R768 Other specified abnormal immunological findings in serum: Secondary | ICD-10-CM

## 2023-12-19 DIAGNOSIS — Z0000 Encounter for general adult medical examination without abnormal findings: Secondary | ICD-10-CM

## 2023-12-19 DIAGNOSIS — Z1231 Encounter for screening mammogram for malignant neoplasm of breast: Secondary | ICD-10-CM

## 2023-12-19 NOTE — Patient Instructions (Signed)
 Deborah Carroll,  Thank you for taking the time for your Medicare Wellness Visit. I appreciate your continued commitment to your health goals. Please review the care plan we discussed, and feel free to reach out if I can assist you further.  Medicare recommends these wellness visits once per year to help you and your care team stay ahead of potential health issues. These visits are designed to focus on prevention, allowing your provider to concentrate on managing your acute and chronic conditions during your regular appointments.  Please note that Annual Wellness Visits do not include a physical exam. Some assessments may be limited, especially if the visit was conducted virtually. If needed, we may recommend a separate in-person follow-up with your provider.  Ongoing Care Seeing your primary care provider every 3 to 6 months helps us  monitor your health and provide consistent, personalized care.   Referrals If a referral was made during today's visit and you haven't received any updates within two weeks, please contact the referred provider directly to check on the status.  You have an order for:  []   2D Mammogram  [x]   3D Mammogram  []   Bone Density     Please call for appointment:   [facility] [address] [place], [place] [address] [phone]  Make sure to wear two-piece clothing.  No lotions, powders, or deodorants the day of the appointment. Make sure to bring picture ID and insurance card.  Bring list of medications you are currently taking including any supplements.   Recommended Screenings:  Health Maintenance  Topic Date Due   DTaP/Tdap/Td vaccine (1 - Tdap) Never done   Zoster (Shingles) Vaccine (1 of 2) Never done   Eye exam for diabetics  01/07/2019   Complete foot exam   07/19/2022   Breast Cancer Screening  01/30/2024   Flu Shot  01/07/2024*   Yearly kidney health urinalysis for diabetes  02/26/2024   Hemoglobin A1C   05/29/2024   Yearly kidney function blood test for diabetes  11/26/2024   Medicare Annual Wellness Visit  12/18/2024   Colon Cancer Screening  05/08/2026   Pneumococcal Vaccine for age over 62  Completed   DEXA scan (bone density measurement)  Completed   Hepatitis C Screening  Completed   HPV Vaccine  Aged Out   Meningitis B Vaccine  Aged Out   COVID-19 Vaccine  Discontinued  *Topic was postponed. The date shown is not the original due date.       12/19/2023    1:09 PM  Advanced Directives  Does Patient Have a Medical Advance Directive? Yes  Type of Advance Directive Living will   Advance Care Planning is important because it: Ensures you receive medical care that aligns with your values, goals, and preferences. Provides guidance to your family and loved ones, reducing the emotional burden of decision-making during critical moments.  Vision: Annual vision screenings are recommended for early detection of glaucoma, cataracts, and diabetic retinopathy. These exams can also reveal signs of chronic conditions such as diabetes and high blood pressure.  Dental: Annual dental screenings help detect early signs of oral cancer, gum disease, and other conditions linked to overall health, including heart disease and diabetes.  Please see the attached documents for additional preventive care recommendations.

## 2023-12-19 NOTE — Progress Notes (Signed)
 Subjective:   Deborah Carroll is a 68 y.o. who presents for a Medicare Wellness preventive visit.  As a reminder, Annual Wellness Visits don't include a physical exam, and some assessments may be limited, especially if this visit is performed virtually. We may recommend an in-person follow-up visit with your provider if needed.  Visit Complete: Virtual I connected with  Deborah Carroll on 12/19/23 by a audio enabled telemedicine application and verified that I am speaking with the correct person using two identifiers.  Patient Location: Home  Provider Location: Home Office  I discussed the limitations of evaluation and management by telemedicine. The patient expressed understanding and agreed to proceed.  Vital Signs: Because this visit was a virtual/telehealth visit, some criteria may be missing or patient reported. Any vitals not documented were not able to be obtained and vitals that have been documented are patient reported.  VideoDeclined- This patient declined Librarian, academic. Therefore the visit was completed with audio only.  Persons Participating in Visit: Patient.  AWV Questionnaire: No: Patient Medicare AWV questionnaire was not completed prior to this visit.  Cardiac Risk Factors include: advanced age (>79men, >83 women);diabetes mellitus;dyslipidemia;hypertension;sedentary lifestyle     Objective:    Today's Vitals   12/19/23 1259  Weight: 236 lb (107 kg)  Height: 5' 2 (1.575 m)   Body mass index is 43.16 kg/m.     12/19/2023    1:09 PM 12/12/2023    2:12 PM 10/02/2023    9:36 AM 05/09/2023    6:21 AM 05/05/2023   11:31 AM 12/13/2022    1:30 PM 10/25/2022    9:55 AM  Advanced Directives  Does Patient Have a Medical Advance Directive? Yes Yes Yes Yes No No No  Type of Advance Directive Living will Living will Living will Healthcare Power of [place];Living will     Does patient want to make changes to medical advance directive?  No -  Patient declined       Copy of Healthcare Power of Attorney in Chart?    No - copy requested     Would patient like information on creating a medical advance directive?     No - Patient declined No - Patient declined No - Patient declined    Current Medications (verified) Outpatient Encounter Medications as of 12/19/2023  Medication Sig   dicyclomine  (BENTYL ) 10 MG capsule Take 1 capsule by mouth three times daily as needed   hydrochlorothiazide  (HYDRODIURIL ) 25 MG tablet Take 1 tablet (25 mg total) by mouth daily.   HYDROcodone -acetaminophen  (NORCO) 10-325 MG tablet 1 taken up to 4 times daily as needed for pain   HYDROcodone -acetaminophen  (NORCO) 10-325 MG tablet 1 taken 4 times daily maximum per day as needed for pain   HYDROcodone -acetaminophen  (NORCO) 10-325 MG tablet 1 taken 4 times daily as needed for pain   metFORMIN  (GLUCOPHAGE -XR) 500 MG 24 hr tablet Take 1 tablet by mouth once daily   pantoprazole  (PROTONIX ) 40 MG tablet Take 1 tablet (40 mg total) by mouth daily.   phenytoin  (DILANTIN ) 100 MG ER capsule TAKE 2 CAPSULES BY MOUTH TWICE DAILY IN THE MORNING AND IN THE EVENING   Potassium Chloride  ER 20 MEQ TBCR Take 1 tablet by mouth twice daily   pregabalin  (LYRICA ) 50 MG capsule Take 1 capsule (50 mg total) by mouth 2 (two) times daily.   propranolol  (INDERAL ) 10 MG tablet Take 1 tablet by mouth twice daily   rosuvastatin  (CRESTOR ) 5 MG tablet Take 1  tablet by mouth once daily   sertraline  (ZOLOFT ) 100 MG tablet Take 2 tablets (200 mg total) by mouth daily.   torsemide  (DEMADEX ) 20 MG tablet Take 1 tablet (20 mg total) by mouth daily as needed (fluid or swelling).   tirzepatide  (MOUNJARO ) 2.5 MG/0.5ML Pen Inject 2.5 mg into the skin once a week. (Patient not taking: Reported on 12/19/2023)   No facility-administered encounter medications on file as of 12/19/2023.    Allergies (verified) Patient has no known allergies.   History: Past Medical History:  Diagnosis Date   Anxiety     Chronic pain    Depression    Diabetes mellitus without complication (HCC)    GERD (gastroesophageal reflux disease)    Hypertension    Morbid obesity (HCC) 03/20/2018   Neuropathy    Peripheral neuropathy    Peripheral neuropathy    Seizures (HCC)    last one 1992- Increased BP with delivery and is on meds still.   Sleep apnea    CPAP x 15 years   Past Surgical History:  Procedure Laterality Date   ABDOMINAL HYSTERECTOMY     ovaries present, no cancer,approx 2001   BACK SURGERY     BIOPSY  05/23/2020   Procedure: BIOPSY;  Surgeon: Cindie Carlin POUR, DO;  [facility]: [facility];  Service: Endoscopy;;   BREAST BIOPSY Left 08/19/2006   Benign calcifications and sclerotic lobules   CARPAL TUNNEL RELEASE     Bilateral   CATARACT EXTRACTION W/PHACO Right 06/20/2015   Procedure: CATARACT EXTRACTION PHACO AND INTRAOCULAR LENS PLACEMENT (IOC);  Surgeon: Oneil Platts, MD;  Location: [facility];  Service: Ophthalmology;  Laterality: Right;  CDE:6.25   CATARACT EXTRACTION W/PHACO Left 08/29/2015   Procedure: CATARACT EXTRACTION PHACO AND INTRAOCULAR LENS PLACEMENT (IOC);  Surgeon: Oneil Platts, MD;  Location: [facility];  Service: Ophthalmology;  Laterality: Left;  CDE: 4.11   CESAREAN SECTION     x2   CHOLECYSTECTOMY     COLONOSCOPY  10/14/2002   WLM:Lorzm[place]upnw involving descending colon, splenic flexure and distal transverse colon with most extensive inflammation in the splenic flexure area.  These changes are typical of ischemic colitis.  Biopsy taken.   COLONOSCOPY N/A 08/04/2012   SLF: POLYPOID LESION IN AT THE APPENDICEAL ORIFICE/Polyp  in the descending colon/Moderate melanosis throughout the entire examined colon/ The colon IS redundant. small tubular adenoma.    COLONOSCOPY WITH PROPOFOL  N/A 11/17/2018   3 tubular adenomas removed, 3 hyperplastic polyps, next colonoscopy in 11/2021.    COLONOSCOPY WITH PROPOFOL  N/A 05/09/2023   Procedure: COLONOSCOPY WITH PROPOFOL ;  Surgeon: Cindie Carlin POUR, DO;  [facility];  Service: Endoscopy;  Laterality: N/A;  730am, asa 3   ESOPHAGOGASTRODUODENOSCOPY (EGD) WITH PROPOFOL  N/A 05/23/2020   Procedure: ESOPHAGOGASTRODUODENOSCOPY (EGD) WITH PROPOFOL ;  Surgeon: Cindie Carlin POUR, DO;  [facility];  Service: Endoscopy;  Laterality: N/A;  11:45am   FOOT SURGERY Bilateral 2004   hammer toe repairs   PARS PLANA VITRECTOMY W/ REPAIR OF MACULAR HOLE     POLYPECTOMY  11/17/2018   Procedure: POLYPECTOMY;  Surgeon: Harvey Margo CROME, MD;  [facility];  Service: Endoscopy;;   POLYPECTOMY  05/23/2020   Procedure: POLYPECTOMY;  Surgeon: Cindie Carlin POUR, DO;  [facility];  Service: Endoscopy;;   POLYPECTOMY  05/09/2023   Procedure: POLYPECTOMY INTESTINAL;  Surgeon: Cindie Carlin POUR, DO;  [facility];  Service: Endoscopy;;   Family History  Problem Relation Age of Onset  Colon polyps Mother 28       benign    Tremor Mother    Emphysema Father    Asthma Father    Healthy Brother    Healthy Brother    Hypertension Maternal Grandmother    Heart attack Maternal Grandfather    Healthy Daughter    Healthy Daughter    Colon cancer Neg Hx    Liver disease Neg Hx    Social History   Socioeconomic History   Marital status: Married    Spouse name: Not on file   Number of children: 2   Years of education: 12   Highest education level: 12th grade  Occupational History   Occupation: disability  Tobacco Use   Smoking status: Former    Types: Cigarettes    Start date: 1980    Quit date: 1976    Years since quitting: 49.7   Smokeless tobacco: Never   Tobacco comments:    smoked x 4 years many years ago  Vaping Use   Vaping status: Never Used  Substance and Sexual Activity   Alcohol use: Not Currently   Drug use: No   Sexual activity: Yes    Birth control/protection: Surgical    Comment: hyst  Other Topics Concern   Not on file  Social History Narrative   lives with husband    Right handed    Social Drivers of Health   Financial Resource Strain: Low Risk  (12/19/2023)   Overall Financial Resource Strain (CARDIA)    Difficulty of Paying Living Expenses: Not hard at all  Food Insecurity: No Food Insecurity (12/19/2023)   Hunger Vital Sign    Worried About Running Out of Food in the Last Year: Never true    Ran Out of Food in the Last Year: Never true  Transportation Needs: No Transportation Needs (12/19/2023)   PRAPARE - Administrator, Civil Service (Medical): No    Lack of Transportation (Non-Medical): No  Physical Activity: Insufficiently Active (12/19/2023)   Exercise Vital Sign    Days of Exercise per Week: 1 day    Minutes of Exercise per Session: 20 min  Stress: No Stress Concern Present (12/19/2023)   [employer] of Occupational Health - Occupational Stress Questionnaire    Feeling of Stress: Only a little  Social Connections: Socially Integrated (12/19/2023)   Social Connection and Isolation Panel    Frequency of Communication with Friends and Family: More than three times a week    Frequency of Social Gatherings with Friends and Family: More than three times a week    Attends Religious Services: More than 4 times per year    Active Member of [employer] or Organizations: Yes    Attends Engineer, structural: More than 4 times per year    Marital Status: Married    Tobacco Counseling Counseling given: Not Answered Tobacco comments: smoked x 4 years many years ago    Clinical Intake:  Pre-visit preparation completed: Yes  Pain : No/denies pain  Diabetes: Yes CBG done?: No Did pt. bring in CBG monitor from home?: No  Lab Results  Component Value Date   HGBA1C 6.6 (H) 11/27/2023   HGBA1C CANCELED 06/03/2023   HGBA1C 6.9 (H) 02/26/2023     How often do you need to have someone help you when you read instructions, pamphlets, or other written materials from your doctor or pharmacy?: 1 - Never  Interpreter Needed?:  No  Information entered by :: Samah Lapiana  LPN   Activities of Daily Living     12/19/2023    1:08 PM 05/05/2023   11:09 AM  In your present state of health, do you have any difficulty performing the following activities:  Hearing? 0 0  Vision? 0 0  Difficulty concentrating or making decisions? 0 0  Walking or climbing stairs? 1   Dressing or bathing? 0   Doing errands, shopping? 0 0  Preparing Food and eating ? N   Using the Toilet? N   In the past six months, have you accidently leaked urine? N   Do you have problems with loss of bowel control? N   Managing your Medications? N   Managing your Finances? N   Housekeeping or managing your Housekeeping? Y     Patient Care Team: [name] LABOR, MD as PCP - General (Family Medicine) Hobart Powell BRAVO, MD (Inactive) as PCP - Cardiology (Cardiology) Colon Shove, MD as Consulting Physician (Neurosurgery) Tat, Asberry RAMAN, DO as Consulting Physician (Neurology)  I have updated your Care Teams any recent Medical Services you may have received from other providers in the past year.     Assessment:   This is a routine wellness examination for [facility].  Hearing/Vision screen Hearing Screening - Comments:: Hearing problems; wears hearing aids  Vision Screening - Comments:: No vision problems; will schedule routine/diabetic eye exam    Goals Addressed             This Visit's Progress    DIET - EAT MORE FRUITS AND VEGETABLES   On track      Depression Screen     12/19/2023    1:07 PM 12/12/2023    2:10 PM 11/27/2023    9:24 AM 08/27/2023   10:09 AM 06/03/2023   11:16 AM 02/26/2023    3:51 PM 02/26/2023    3:50 PM  PHQ 2/9 Scores  PHQ - 2 Score 0 0 2 2 2 2 1   PHQ- 9 Score   13 7 4 8 4     Fall Risk     12/19/2023    1:08 PM 11/27/2023    9:24 AM 10/02/2023    9:36 AM 06/03/2023   11:16 AM 12/13/2022    1:44 PM  Fall Risk   Falls in the past year? 0 0 0 0 0  Number falls in past yr: 0  0  0  Injury with Fall? 0  0  0   Risk for fall due to : Impaired mobility    Impaired mobility  Follow up Falls prevention discussed;Education provided;Falls evaluation completed  Falls evaluation completed  Falls prevention discussed;Education provided    MEDICARE RISK AT HOME:  Medicare Risk at Home Any stairs in or around the home?: No If so, are there any without handrails?: No Home free of loose throw rugs in walkways, pet beds, electrical cords, etc?: Yes Adequate lighting in your home to reduce risk of falls?: Yes Life alert?: No Use of a cane, walker or w/c?: No Grab bars in the bathroom?: Yes Shower chair or bench in shower?: No Elevated toilet seat or a handicapped toilet?: Yes  TIMED UP AND GO:  Was the test performed?  No  Cognitive Function: 6CIT completed        12/19/2023    1:09 PM 12/13/2022    1:36 PM 12/04/2021    3:17 PM  6CIT Screen  What Year? 0 points 0 points 0 points  What month? 0 points 0 points 0  points  What time? 0 points 0 points 0 points  Count back from 20 0 points 0 points 0 points  Months in reverse 0 points 0 points 0 points  Repeat phrase 0 points 0 points 0 points  Total Score 0 points 0 points 0 points    Immunizations Immunization History  Administered Date(s) Administered   Influenza Split 02/22/2013   Influenza,inj,Quad PF,6+ Mos 02/15/2014, 03/20/2018   Influenza-Unspecified 01/22/2012   PNEUMOCOCCAL CONJUGATE-20 01/21/2022   Pneumococcal Polysaccharide-23 08/07/2020    Screening Tests Health Maintenance  Topic Date Due   DTaP/Tdap/Td (1 - Tdap) Never done   Zoster Vaccines- Shingrix (1 of 2) Never done   OPHTHALMOLOGY EXAM  01/07/2019   FOOT EXAM  07/19/2022   Mammogram  01/30/2024   Influenza Vaccine  01/07/2024 (Originally 11/07/2023)   Diabetic kidney evaluation - Urine ACR  02/26/2024   HEMOGLOBIN A1C  05/29/2024   Diabetic kidney evaluation - eGFR measurement  11/26/2024   Medicare Annual Wellness (AWV)  12/18/2024   Colonoscopy  05/08/2026    Pneumococcal Vaccine: 50+ Years  Completed   DEXA SCAN  Completed   Hepatitis C Screening  Completed   HPV VACCINES  Aged Out   Meningococcal B Vaccine  Aged Out   COVID-19 Vaccine  Discontinued    Health Maintenance Items Addressed: Patient declines flu shot; mammogram ordered  Additional Screening:  Vision Screening: Recommended annual ophthalmology exams for early detection of glaucoma and other disorders of the eye. Is the patient up to date with their annual eye exam?  No  Who is the provider or what is the name of the office in which the patient attends annual eye exams? None; patient plans to establish with doctor that husband currently sees   Dental Screening: Recommended annual dental exams for proper oral hygiene  Community Resource Referral / Chronic Care Management: CRR required this visit?  No   CCM required this visit?  No   Plan:    I have personally reviewed and noted the following in the patient's chart:   Medical and social history Use of alcohol, tobacco or illicit drugs  Current medications and supplements including opioid prescriptions. Patient is currently taking opioid prescriptions. Information provided to patient regarding non-opioid alternatives. Patient advised to discuss non-opioid treatment plan with their provider. Functional ability and status Nutritional status Physical activity Advanced directives List of other physicians Hospitalizations, surgeries, and ER visits in previous 12 months Vitals Screenings to include cognitive, depression, and falls Referrals and appointments  In addition, I have reviewed and discussed with patient certain preventive protocols, quality metrics, and best practice recommendations. A written personalized care plan for preventive services as well as general preventive health recommendations were provided to patient.   Lavelle Pfeiffer [place], [place]   [id number]   After Visit Summary: (MyChart) Due to this being  a telephonic visit, the after visit summary with patients personalized plan was offered to patient via MyChart   Notes: Nothing significant to report at this time.

## 2023-12-24 LAB — UPEP/UIFE/LIGHT CHAINS/TP, 24-HR UR
% BETA, Urine: 29.5 %
ALPHA 1 URINE: 2.8 %
Albumin, U: 33.5 %
Alpha 2, Urine: 14.6 %
Free Kappa Lt Chains,Ur: 9.45 mg/L (ref 1.17–86.46)
Free Kappa/Lambda Ratio: 4.97 (ref 1.83–14.26)
Free Lambda Lt Chains,Ur: 1.9 mg/L (ref 0.27–15.21)
GAMMA GLOBULIN URINE: 19.5 %
Total Protein, Urine-Ur/day: 88 mg/(24.h) (ref 30–150)
Total Protein, Urine: 17.6 mg/dL
Total Volume: 500

## 2024-01-02 ENCOUNTER — Ambulatory Visit: Payer: Self-pay | Admitting: Oncology

## 2024-01-02 NOTE — Progress Notes (Signed)
 Called patient with no answer. Left message for patient to call back.

## 2024-01-05 ENCOUNTER — Encounter: Payer: Self-pay | Admitting: Family Medicine

## 2024-01-05 NOTE — Telephone Encounter (Signed)
 Nurses Please communicate the following with [place]  #1-the refills on the Mounjaro  was just in case she did not give me any feedback that she would have enough to last until her every 13-month office visit  #2-it is hard to know if the nausea is related to the medicine or not.  The goal of this medication is to get the A1c down.  For some individuals they can even lose some weight but that is not the primary goal.  #3-it seems reasonable to increase Mounjaro  to 5 mg once a week, 1 month supply with 2 refills She can give us  feedback after utilizing the 5 mg for 2 to 3 weeks She would go up on the dose to 5 mg on her next prescription Cancel the 2.5 mg prescription.  Obviously if she has significant side effects with the higher dose we may have to discontinue medication  #3 it does not sound like Lyrica  is helping her at all and is causing balance issues along with pedal edema I recommend stopping Lyrica   #4-I recommend A1c, metabolic 7, urine ACR diagnosis type 2 diabetes patient should do this lab work late November or early December for before her follow-up in December  #5-she can send us  additional updates on how she is doing after she has been on the 5 mg for several weeks sooner if any problems You may share this message with her Thanks-Dr. Glendia

## 2024-01-06 DIAGNOSIS — G4733 Obstructive sleep apnea (adult) (pediatric): Secondary | ICD-10-CM | POA: Diagnosis not present

## 2024-01-06 NOTE — Progress Notes (Signed)
 Patient aware that urine is normal and is agreeable with plan of keeping follow up as scheduled.

## 2024-01-08 ENCOUNTER — Other Ambulatory Visit: Payer: Self-pay

## 2024-01-08 DIAGNOSIS — E119 Type 2 diabetes mellitus without complications: Secondary | ICD-10-CM

## 2024-01-08 MED ORDER — TIRZEPATIDE 5 MG/0.5ML ~~LOC~~ SOAJ: 5.0000 mg | SUBCUTANEOUS | 2 refills | Status: DC

## 2024-01-11 ENCOUNTER — Other Ambulatory Visit: Payer: Self-pay | Admitting: Family Medicine

## 2024-01-15 ENCOUNTER — Other Ambulatory Visit: Payer: Self-pay | Admitting: Family Medicine

## 2024-02-09 ENCOUNTER — Other Ambulatory Visit: Payer: Self-pay | Admitting: Family Medicine

## 2024-03-01 ENCOUNTER — Encounter: Payer: Self-pay | Admitting: Family Medicine

## 2024-03-02 ENCOUNTER — Other Ambulatory Visit: Payer: Self-pay | Admitting: Nurse Practitioner

## 2024-03-02 MED ORDER — TIRZEPATIDE 7.5 MG/0.5ML ~~LOC~~ SOAJ: 7.5000 mg | SUBCUTANEOUS | 0 refills | Status: DC

## 2024-03-12 ENCOUNTER — Other Ambulatory Visit: Payer: Self-pay | Admitting: Family Medicine

## 2024-03-13 ENCOUNTER — Encounter: Payer: Self-pay | Admitting: Family Medicine

## 2024-03-15 ENCOUNTER — Other Ambulatory Visit: Payer: Self-pay | Admitting: Family Medicine

## 2024-03-15 MED ORDER — HYDROCODONE-ACETAMINOPHEN 10-325 MG PO TABS: ORAL_TABLET | ORAL | 0 refills | Status: DC

## 2024-03-22 ENCOUNTER — Ambulatory Visit: Admitting: Family Medicine

## 2024-03-27 ENCOUNTER — Other Ambulatory Visit: Payer: Self-pay | Admitting: Nurse Practitioner

## 2024-03-29 ENCOUNTER — Ambulatory Visit: Admitting: Nurse Practitioner

## 2024-03-29 ENCOUNTER — Other Ambulatory Visit: Payer: Self-pay

## 2024-03-29 ENCOUNTER — Inpatient Hospital Stay: Attending: Oncology

## 2024-03-29 ENCOUNTER — Encounter: Payer: Self-pay | Admitting: Nurse Practitioner

## 2024-03-29 VITALS — BP 113/76 | HR 63 | Temp 97.3°F | Ht 62.0 in | Wt 209.0 lb

## 2024-03-29 DIAGNOSIS — R7689 Other specified abnormal immunological findings in serum: Secondary | ICD-10-CM | POA: Insufficient documentation

## 2024-03-29 DIAGNOSIS — G609 Hereditary and idiopathic neuropathy, unspecified: Secondary | ICD-10-CM

## 2024-03-29 DIAGNOSIS — E119 Type 2 diabetes mellitus without complications: Secondary | ICD-10-CM

## 2024-03-29 DIAGNOSIS — Z79891 Long term (current) use of opiate analgesic: Secondary | ICD-10-CM

## 2024-03-29 DIAGNOSIS — Z7985 Long-term (current) use of injectable non-insulin antidiabetic drugs: Secondary | ICD-10-CM | POA: Diagnosis not present

## 2024-03-29 LAB — COMPREHENSIVE METABOLIC PANEL WITH GFR
ALT: 28 U/L (ref 0–44)
AST: 34 U/L (ref 15–41)
Albumin: 4.3 g/dL (ref 3.5–5.0)
Alkaline Phosphatase: 124 U/L (ref 38–126)
Anion gap: 9 (ref 5–15)
BUN: 8 mg/dL (ref 8–23)
CO2: 30 mmol/L (ref 22–32)
Calcium: 9 mg/dL (ref 8.9–10.3)
Chloride: 102 mmol/L (ref 98–111)
Creatinine, Ser: 0.63 mg/dL (ref 0.44–1.00)
GFR, Estimated: >60 mL/min
Glucose, Bld: 116 mg/dL — ABNORMAL HIGH (ref 70–99)
Potassium: 3.2 mmol/L — ABNORMAL LOW (ref 3.5–5.1)
Sodium: 142 mmol/L (ref 135–145)
Total Bilirubin: 0.4 mg/dL (ref 0.0–1.2)
Total Protein: 7.5 g/dL (ref 6.5–8.1)

## 2024-03-29 LAB — CBC WITH DIFFERENTIAL/PLATELET
Abs Immature Granulocytes: 0.05 K/uL (ref 0.00–0.07)
Basophils Absolute: 0 K/uL (ref 0.0–0.1)
Basophils Relative: 0 %
Eosinophils Absolute: 0.2 K/uL (ref 0.0–0.5)
Eosinophils Relative: 2 %
HCT: 40.3 % (ref 36.0–46.0)
Hemoglobin: 13.3 g/dL (ref 12.0–15.0)
Immature Granulocytes: 0 %
Lymphocytes Relative: 27 %
Lymphs Abs: 3.5 K/uL (ref 0.7–4.0)
MCH: 29.6 pg (ref 26.0–34.0)
MCHC: 33 g/dL (ref 30.0–36.0)
MCV: 89.8 fL (ref 80.0–100.0)
Monocytes Absolute: 0.7 K/uL (ref 0.1–1.0)
Monocytes Relative: 6 %
Neutro Abs: 8.2 K/uL — ABNORMAL HIGH (ref 1.7–7.7)
Neutrophils Relative %: 65 %
Platelets: 251 K/uL (ref 150–400)
RBC: 4.49 MIL/uL (ref 3.87–5.11)
RDW: 12.9 % (ref 11.5–15.5)
WBC: 12.6 K/uL — ABNORMAL HIGH (ref 4.0–10.5)
nRBC: 0 % (ref 0.0–0.2)

## 2024-03-29 MED ORDER — HYDROCODONE-ACETAMINOPHEN 10-325 MG PO TABS: ORAL_TABLET | ORAL | 0 refills | Status: AC

## 2024-03-29 NOTE — Progress Notes (Addendum)
 "  Subjective:    Patient ID: Deborah Carroll, female    DOB: 07/25/1955, 68 y.o.   MRN: [id number]  HPI Discussed the use of AI scribe software for clinical note transcription with the patient, who gave verbal consent to proceed.  History of Present Illness Deborah Carroll is a 68 year old female with diabetes and neuropathy who presents for pain management.  She experiences severe neuropathic pain in her feet and legs, reaching a level of 10 out of 10 without medication. Hydrocodone  10 mg taken up to four times daily reduces her pain to a tolerable level of 2 out of 10. She emphasizes the importance of taking the medication before the pain becomes severe, as it takes longer to work otherwise. A heating pad provides additional relief, especially after activities like walking, which can exacerbate her pain. In the past, has tried injections, acupuncture, physical therapy and electrical pulse stimulation with minimal improvement.   She has diabetes and has been using Mounjaro , resulting in significant weight loss from 236 lbs to 209 lbs. She feels better both mentally and physically since starting the medication. Initially, Mounjaro  caused constipation, but she has adjusted, and stool softeners are effective in managing this side effect. She has not had recent blood work since starting Mounjaro  but is due to have an A1c and urine protein test done.  She has a history of a herniated thoracic disc and other orthopedic issues. She previously tried Lyrica  and gabapentin for nerve pain but discontinued them due to nausea. She is currently on sertraline  200 mg daily for anxiety and depression, which she reports is working well.  No shortness of breath, chest pain, or coughing. No acid reflux, heartburn, nausea, or vomiting. She experiences numbness and tingling in her feet. No significant swelling in her legs unless she is on a cruise, which she attributes to increased sodium intake.     03/29/2024   10:01  AM 12/19/2023    1:07 PM 12/12/2023    2:10 PM 11/27/2023    9:24 AM 08/27/2023   10:09 AM  Depression screen PHQ 2/9  Decreased Interest 1 0 0 1 1  Down, Depressed, Hopeless 0 0 0 1 1  PHQ - 2 Score 1 0 0 2 2  Altered sleeping 0   2 1  Tired, decreased energy 1   2 1   Change in appetite 0   2 1  Feeling bad or failure about yourself  0   1 1  Trouble concentrating 0   2 1  Moving slowly or fidgety/restless 0   1 0  Suicidal thoughts 0   1 0  PHQ-9 Score 2   13  7    Difficult doing work/chores Not difficult at all   Not difficult at all Not difficult at all     Data saved with a previous flowsheet row definition      03/29/2024   10:01 AM 11/27/2023    9:24 AM 08/27/2023   10:09 AM 06/03/2023   11:16 AM  GAD 7 : Generalized Anxiety Score  Nervous, Anxious, on Edge 1 2 1 1   Control/stop worrying 1 2 1 1   Worry too much - different things 1 2 1 1   Trouble relaxing 1 2 1 1   Restless 1 2 1 1   Easily annoyed or irritable 0 2 1 1   Afraid - awful might happen 1 2 1 1   Total GAD 7 Score 6 14 7 7   Anxiety Difficulty Somewhat difficult  Not difficult at all Not difficult at all Not difficult at all    Social History[1]      Objective:   Physical Exam NAD.  Alert, oriented.  Calm cheerful affect.  Speech clear.  Lungs clear.  Heart regular rate rhythm.  Lower extremities no edema.  Diabetic Foot Exam - Simple   Simple Foot Form Visual Inspection No deformities, no ulcerations, no other skin breakdown bilaterally: Yes Sensation Testing See comments: Yes Pulse Check See comments: Yes Comments DP pulses present bilaterally. Sensation absent in both feet.     Today's Vitals   03/29/24 0958  BP: 113/76  Pulse: 63  Temp: (!) 97.3 F (36.3 C)  SpO2: 96%  Weight: 209 lb (94.8 kg)  Height: 5' 2 (1.575 m)   Body mass index is 38.23 kg/m.       Assessment & Plan:  1. Encounter for long-term opiate analgesic use (Primary) Chronic pain due to idiopathic peripheral  neuropathy and herniated thoracic disc Pain managed effectively with hydrocodone . Lyrica  and gabapentin discontinued due to nausea. Duloxetine  not considered due to sertraline  use. - Continue hydrocodone  10 mg up to four times daily.  - HYDROcodone -acetaminophen  (NORCO) 10-325 MG tablet; Take one tab po 4 times daily as needed for severe pain  Dispense: 120 tablet; Refill: 0 - HYDROcodone -acetaminophen  (NORCO) 10-325 MG tablet; Take one tab po 4 times daily as needed for severe pain  Dispense: 120 tablet; Refill: 0 - HYDROcodone -acetaminophen  (NORCO) 10-325 MG tablet; Take one tab po 4 times daily as needed for severe pain  Dispense: 120 tablet; Refill: 0  2. Idiopathic peripheral neuropathy Chronic pain due to idiopathic peripheral neuropathy and herniated thoracic disc Pain managed effectively with hydrocodone . Lyrica  and gabapentin discontinued due to nausea. Duloxetine  not considered due to sertraline  use. - Continue hydrocodone  10 mg up to four times daily.  - HYDROcodone -acetaminophen  (NORCO) 10-325 MG tablet; Take one tab po 4 times daily as needed for severe pain  Dispense: 120 tablet; Refill: 0 - HYDROcodone -acetaminophen  (NORCO) 10-325 MG tablet; Take one tab po 4 times daily as needed for severe pain  Dispense: 120 tablet; Refill: 0 - HYDROcodone -acetaminophen  (NORCO) 10-325 MG tablet; Take one tab po 4 times daily as needed for severe pain  Dispense: 120 tablet; Refill: 0  3. Diabetes mellitus without complication (HCC) Blood sugar well-controlled with Mounjaro . Significant weight loss achieved. A1c and urine protein tests pending. - Ordered A1c and urine protein tests. - Continue Mounjaro  for diabetes management. - Microalbumin / creatinine urine ratio - Hemoglobin A1c - Encouraged scheduling of annual diabetic retinopathy screening.  Return in about 3 months (around 06/27/2024).       [1]  Social History Tobacco Use   Smoking status: Former    Types: Cigarettes     Start date: 1980    Quit date: 1976    Years since quitting: 50.0   Smokeless tobacco: Never   Tobacco comments:    smoked x 4 years many years ago  Vaping Use   Vaping status: Never Used  Substance Use Topics   Alcohol use: Not Currently   Drug use: No   "

## 2024-03-30 ENCOUNTER — Ambulatory Visit: Payer: Self-pay | Admitting: Nurse Practitioner

## 2024-03-30 LAB — MICROALBUMIN / CREATININE URINE RATIO
Creatinine, Urine: 234.9 mg/dL
Microalb/Creat Ratio: 19 mg/g{creat} (ref 0–29)
Microalbumin, Urine: 43.6 ug/mL

## 2024-03-30 LAB — KAPPA/LAMBDA LIGHT CHAINS
Kappa free light chain: 21.2 mg/L — ABNORMAL HIGH (ref 3.3–19.4)
Kappa, lambda light chain ratio: 0.87 (ref 0.26–1.65)
Lambda free light chains: 24.4 mg/L (ref 5.7–26.3)

## 2024-03-30 LAB — HEMOGLOBIN A1C
Est. average glucose Bld gHb Est-mCnc: 111 mg/dL
Hgb A1c MFr Bld: 5.5 % (ref 4.8–5.6)

## 2024-04-02 LAB — PROTEIN ELECTROPHORESIS, SERUM
A/G Ratio: 1 (ref 0.7–1.7)
Albumin ELP: 3.6 g/dL (ref 2.9–4.4)
Alpha-1-Globulin: 0.3 g/dL (ref 0.0–0.4)
Alpha-2-Globulin: 0.9 g/dL (ref 0.4–1.0)
Beta Globulin: 1.3 g/dL (ref 0.7–1.3)
Gamma Globulin: 1 g/dL (ref 0.4–1.8)
Globulin, Total: 3.5 g/dL (ref 2.2–3.9)
Total Protein ELP: 7.1 g/dL (ref 6.0–8.5)

## 2024-04-04 LAB — IMMUNOFIXATION ELECTROPHORESIS
IgA: 447 mg/dL — ABNORMAL HIGH (ref 87–352)
IgG (Immunoglobin G), Serum: 1040 mg/dL (ref 586–1602)
IgM (Immunoglobulin M), Srm: 147 mg/dL (ref 26–217)
Total Protein ELP: 7 g/dL (ref 6.0–8.5)

## 2024-04-05 ENCOUNTER — Encounter: Payer: Self-pay | Admitting: *Deleted

## 2024-04-06 ENCOUNTER — Other Ambulatory Visit: Payer: Self-pay | Admitting: *Deleted

## 2024-04-06 ENCOUNTER — Ambulatory Visit: Payer: Self-pay | Admitting: Oncology

## 2024-04-06 DIAGNOSIS — E876 Hypokalemia: Secondary | ICD-10-CM

## 2024-04-06 NOTE — Progress Notes (Unsigned)
 "   Assessment/Plan:    1.  Essential Tremor.             - Prior propranolol  10 mg without benefit, but has been bradycardic and cannot increase  -***Primidone 2.  Chronic low back pain             - Patient used to follow with pain management.  Now following with primary care for pain management.  On hydrocodone .  On Lyrica .   3.  Remote history of seizure             - Last seizure was 1992.             - Patient on chronic Dilantin              - Last Dilantin  level was in 14.3 in August, 2025  Subjective:   Deborah Carroll was seen today in follow up for essential tremor.  My previous records were reviewed prior to todays visit.  We started to start her on primidone, 50 mg twice per day last visit, but I also told her that this may not be enough dosage.  She reports today that ***.  Separately, she was recently sent to hematology for elevated kappa free light chains but the kappa/lambda ratio was unremarkable.  No M spike was noted.  She is just being monitored. Current prescribed movement disorder medications: ***Primidone, 50 mg twice per day   PREVIOUS MEDICATIONS: Propranolol  10 mg  ALLERGIES:  Allergies[1]  CURRENT MEDICATIONS: Active Medications[2]    Objective:    PHYSICAL EXAMINATION:    VITALS:  There were no vitals filed for this visit.  GEN:  The patient appears stated age and is in NAD. HEENT:  Normocephalic, atraumatic.  The mucous membranes are moist. The superficial temporal arteries are without ropiness or tenderness. CV:  RRR Lungs:  CTAB Neck/HEME:  There are no carotid bruits bilaterally.  Neurological examination:  Orientation: The patient is alert and oriented x3. Cranial nerves: There is good facial symmetry. The speech is fluent and clear. Soft palate rises symmetrically and there is no tongue deviation. Hearing is intact to conversational tone. Sensation: Sensation is intact to light touch throughout Motor: Strength is at least antigravity  x4.  Movement examination: Tone: There is nl tone in the bilateral upper extremities.  The tone in the lower extremities is nl.  Abnormal movements: no rest tremor.  Min postural tremor.  There is intention tremor, mod on the R, mild on the L, worse with a weight.  She does fairly well pouring water  from 1 glass to another and does not spill the water , but there is evidence of tremor when the full glass of water  is in the right hand.  She has trouble with Archimedes spirals, especially on the right. Coordination:  There is no decremation with RAM's, with any form of RAMS, including alternating supination and pronation of the forearm, hand opening and closing, finger taps, heel taps and toe taps.  Gait and Station: The patient has no difficulty arising out of a deep-seated chair without the use of the hands. The patient's stride length is good I have reviewed and interpreted the following labs independently   Chemistry      Component Value Date/Time   NA 142 03/29/2024 1107   NA 143 11/27/2023 1057   K 3.2 (L) 03/29/2024 1107   CL 102 03/29/2024 1107   CO2 30 03/29/2024 1107   BUN 8 03/29/2024 1107   BUN  9 11/27/2023 1057   CREATININE 0.63 03/29/2024 1107   CREATININE 0.66 05/16/2014 1023      Component Value Date/Time   CALCIUM  9.0 03/29/2024 1107   ALKPHOS 124 03/29/2024 1107   AST 34 03/29/2024 1107   ALT 28 03/29/2024 1107   BILITOT 0.4 03/29/2024 1107   BILITOT 0.4 11/27/2023 1057      Lab Results  Component Value Date   WBC 12.6 (H) 03/29/2024   HGB 13.3 03/29/2024   HCT 40.3 03/29/2024   MCV 89.8 03/29/2024   PLT 251 03/29/2024   Lab Results  Component Value Date   TSH 1.760 10/17/2021     Chemistry      Component Value Date/Time   NA 142 03/29/2024 1107   NA 143 11/27/2023 1057   K 3.2 (L) 03/29/2024 1107   CL 102 03/29/2024 1107   CO2 30 03/29/2024 1107   BUN 8 03/29/2024 1107   BUN 9 11/27/2023 1057   CREATININE 0.63 03/29/2024 1107   CREATININE 0.66  05/16/2014 1023      Component Value Date/Time   CALCIUM  9.0 03/29/2024 1107   ALKPHOS 124 03/29/2024 1107   AST 34 03/29/2024 1107   ALT 28 03/29/2024 1107   BILITOT 0.4 03/29/2024 1107   BILITOT 0.4 11/27/2023 1057         Total time spent on today's visit was ***30 minutes, including both face-to-face time and nonface-to-face time.  Time included that spent on review of records (prior notes available to me/labs/imaging if pertinent), discussing treatment and goals, answering patient's questions and coordinating care.  Cc:  [name] LABOR, MD     [1] No Known Allergies [2]  No outpatient medications have been marked as taking for the 04/15/24 encounter (Appointment) with Deborah Carroll, Deborah RAMAN, DO.   "

## 2024-04-06 NOTE — Progress Notes (Signed)
 Good morning, patient's potassium level is low.  I know she is currently on potassium supplements or it looks like it based on her labs can you make sure that she is taking it and we may need to increase this some for a few days.  She also could eat a potassium rich diet and we could recheck at her next visit.  Delon Hope, AGNP-C Department of Hematology/Oncology [facility] at [facility]  Phone: [phone]  04/06/2024 7:47 AM

## 2024-04-06 NOTE — Progress Notes (Signed)
 Patient states that she had been out of her potassium and just restarted twice daily.  Will recheck at next visit per recommendations.

## 2024-04-13 ENCOUNTER — Inpatient Hospital Stay

## 2024-04-13 ENCOUNTER — Ambulatory Visit: Payer: Self-pay | Admitting: Oncology

## 2024-04-13 ENCOUNTER — Inpatient Hospital Stay: Attending: Oncology | Admitting: Oncology

## 2024-04-13 VITALS — BP 126/83 | HR 68 | Temp 97.7°F | Resp 18 | Ht 62.0 in | Wt 208.0 lb

## 2024-04-13 DIAGNOSIS — D72829 Elevated white blood cell count, unspecified: Secondary | ICD-10-CM | POA: Insufficient documentation

## 2024-04-13 DIAGNOSIS — G629 Polyneuropathy, unspecified: Secondary | ICD-10-CM | POA: Diagnosis not present

## 2024-04-13 DIAGNOSIS — R7689 Other specified abnormal immunological findings in serum: Secondary | ICD-10-CM | POA: Insufficient documentation

## 2024-04-13 DIAGNOSIS — E876 Hypokalemia: Secondary | ICD-10-CM | POA: Insufficient documentation

## 2024-04-13 LAB — POTASSIUM: Potassium: 3.6 mmol/L (ref 3.5–5.1)

## 2024-04-13 NOTE — Assessment & Plan Note (Addendum)
 She has had intermittently elevated white blood cell count dating back to 2008. White count has ranged from normal to as high as 14.7. Will check flow cytometry at her next visit but likely related to obesity.  She is a non-smoker.  She is not currently using steroids.

## 2024-04-13 NOTE — Assessment & Plan Note (Addendum)
 Potassium was low at 3.2 on 03/29/2024. Previously, patient ran out of potassium supplements.  She was instructed to restart 20 mill equivalents twice daily and we will recheck at her next visit.

## 2024-04-13 NOTE — Progress Notes (Signed)
 "  [facility] OFFICE PROGRESS NOTE  Deborah Carroll LABOR, MD  ASSESSMENT & PLAN:    Assessment & Plan Elevated serum immunoglobulin free light chains The patient has had an elevated serum kappa light chain for over a year and more recently developed a mildly elevated serum lambda light chain.  The kappa/lambda ratio is normal.  SPEP shows no M spike.  Immunofixation shows a mildly elevated IgA level and light chain specificity could not be verified.  24-hour urine from 12/19/2023 did not reveal evidence of an M spike in the urine.  Repeat labs from 03/29/2024 show elevated kappa free light chains at 21.2 but normal lambda free light chains and kappa lambda light chain ratio.  Immunofixation shows a faint band noted in IgA but no light chain specificity could be verified.  Recommend retesting in 4 to 6 months. Protein electrophoresis did not reveal evidence of M spike.  No crab criteria.  She has no anemia, hypercalcemia or renal dysfunction.  Patient reports night sweats almost every night where she sweats through her pajamas but not through her sheets.  Reports this has been going on for years.  Appetite is stable.  No fevers.  She has lost 25 pounds according to her scale due to Mounjaro .  Return to clinic in 6 months with labs a few days before.   Neuropathy The patient has longstanding neuropathy in her feet/legs and fingertips. Etiology is unclear. Monitor.  Leukocytosis, unspecified type She has had intermittently elevated white blood cell count dating back to 2008. White count has ranged from normal to as high as 14.7. Will check flow cytometry at her next visit but likely related to obesity.  She is a non-smoker.  She is not currently using steroids.  Hypokalemia Potassium was low at 3.2 on 03/29/2024. Previously, patient ran out of potassium supplements.  She was instructed to restart 20 mill equivalents twice daily and we will recheck at her next visit.  Orders Placed  This Encounter  Procedures   CBC with Differential    Standing Status:   Future    Expected Date:   10/11/2024    Expiration Date:   01/09/2025   Comprehensive metabolic panel    Standing Status:   Future    Expected Date:   10/11/2024    Expiration Date:   01/09/2025   Kappa/lambda light chains    Standing Status:   Future    Expected Date:   10/11/2024    Expiration Date:   01/09/2025   Immunofixation electrophoresis    Standing Status:   Future    Expected Date:   10/11/2024    Expiration Date:   01/09/2025   Protein electrophoresis, serum    Standing Status:   Future    Expected Date:   10/11/2024    Expiration Date:   01/09/2025    INTERVAL HISTORY: Deborah Carroll is a 69 year old female who is seen today for initial evaluation for elevated serum light chains.  She underwent lab work on 11/27/2023 which showed a normal CBC including a WBC of 9.5, hemoglobin 13.1, MCV 90, platelets 159,000, creatinine 0.66, GFR 96, calcium  9.3, vitamin B12 693, serum protein electrophoresis shows showed no M spike and immunofixation showed a mildly elevated IgA level of 429 with a faint band noted in IgA and light chain specificity could not be verified.  Serum light chains showed a mildly elevated kappa free light chain of 22.6 and mildly elevated lambda free light chain of 26.8  with a normal kappa/lambda ratio of 0.84.  Light chains have been checked intermittently in the past and the patient has had a persistently elevated kappa free light chain dating back to April 2024.  The kappa/lambda ratio has always been persistently normal.   She reports overall doing well since her last visit.  Appetite 75% energy levels are 40%.  She has 6 out of 10 back pain but this is chronic.  Has chronic constipation and takes stool softeners for this.  Has numbness and tingling in bilateral lower extremities.  Reports nightly sweating where she soaks through her pajamas but not through her sheets.  Reports she requires a fan close to  her face for her to sleep.  She has lost approximately 25 pounds since starting Mounjaro .  Hemoglobin A1c is now within normal range.  She is scheduled to get labs drawn today for neurology.  Reports her potassium levels are chronically low and takes potassium supplements.  During her last lab draw, patient had run out of potassium supplements and they had not been called in for her to resume.  She since has resumed 20 mill equivalents potassium twice daily.  She is also on torsemide  and hydrochlorothiazide  which are likely contributing.  We reviewed CBC with differential, CMP, kappa lambda light chains, IFE, protein electrophoresis.  SUMMARY OF HEMATOLOGIC HISTORY: Oncology History   No problem history exists.     CBC    Component Value Date/Time   WBC 12.6 (H) 03/29/2024 1107   RBC 4.49 03/29/2024 1107   HGB 13.3 03/29/2024 1107   HGB 13.1 11/27/2023 1057   HCT 40.3 03/29/2024 1107   HCT 40.2 11/27/2023 1057   HCT 43 06/15/2012 0000   PLT 251 03/29/2024 1107   PLT 159 11/27/2023 1057   MCV 89.8 03/29/2024 1107   MCV 90 11/27/2023 1057   MCV 85.2 06/15/2012 0000   MCH 29.6 03/29/2024 1107   MCHC 33.0 03/29/2024 1107   RDW 12.9 03/29/2024 1107   RDW 13.6 11/27/2023 1057   LYMPHSABS 3.5 03/29/2024 1107   LYMPHSABS 2.9 11/27/2023 1057   MONOABS 0.7 03/29/2024 1107   EOSABS 0.2 03/29/2024 1107   EOSABS 0.2 11/27/2023 1057   BASOSABS 0.0 03/29/2024 1107   BASOSABS 0.0 11/27/2023 1057       Latest Ref Rng & Units 03/29/2024   11:07 AM 11/27/2023   10:57 AM 02/26/2023    4:37 PM  CMP  Glucose 70 - 99 mg/dL 883  858  870   BUN 8 - 23 mg/dL 8  9  11    Creatinine 0.44 - 1.00 mg/dL 9.36  9.33  9.31   Sodium 135 - 145 mmol/L 142  143  143   Potassium 3.5 - 5.1 mmol/L 3.2  4.5  4.0   Chloride 98 - 111 mmol/L 102  102  101   CO2 22 - 32 mmol/L 30  25  25    Calcium  8.9 - 10.3 mg/dL 9.0  9.3  9.5   Total Protein 6.5 - 8.1 g/dL 7.5  7.2  7.2   Total Bilirubin 0.0 - 1.2 mg/dL  0.4  0.4  <9.7   Alkaline Phos 38 - 126 U/L 124  129  149   AST 15 - 41 U/L 34  39  20   ALT 0 - 44 U/L 28  31  18       Lab Results  Component Value Date   FERRITIN 139 08/13/2017   VITAMINB12 693 11/27/2023  Vitals:   04/13/24 1139  BP: 126/83  Pulse: 68  Resp: 18  Temp: 97.7 F (36.5 C)  SpO2: 98%    Review of System:  Review of Systems  Constitutional:  Positive for malaise/fatigue.  Gastrointestinal:  Positive for constipation.  Musculoskeletal:  Positive for back pain.  Neurological:  Positive for tingling and sensory change.    Physical Exam: Physical Exam Constitutional:      Appearance: Normal appearance.  HENT:     Head: Normocephalic and atraumatic.  Eyes:     Pupils: Pupils are equal, round, and reactive to light.  Cardiovascular:     Rate and Rhythm: Normal rate and regular rhythm.     Heart sounds: Normal heart sounds. No murmur heard. Pulmonary:     Effort: Pulmonary effort is normal.     Breath sounds: Normal breath sounds. No wheezing.  Abdominal:     General: Bowel sounds are normal. There is no distension.     Palpations: Abdomen is soft.     Tenderness: There is no abdominal tenderness.  Musculoskeletal:        General: Normal range of motion.     Cervical back: Normal range of motion.  Skin:    General: Skin is warm and dry.     Findings: No rash.  Neurological:     Mental Status: She is alert and oriented to person, place, and time.     Gait: Gait is intact.  Psychiatric:        Mood and Affect: Mood and affect normal.        Cognition and Memory: Memory normal.        Judgment: Judgment normal.      I spent 20 minutes dedicated to the care of this patient (face-to-face and non-face-to-face) on the date of the encounter to include what is described in the assessment and plan.,  Delon Hope, NP 04/13/2024 12:46 PM "

## 2024-04-13 NOTE — Progress Notes (Signed)
 Hey Deborah Carroll, let her know that her potassium levels are back to normal.  Continue potassium supplements as she has been taking them.  Delon Hope, AGNP-C Department of Hematology/Oncology [facility] at [facility]  Phone: [phone]  04/13/2024 3:11 PM

## 2024-04-13 NOTE — Assessment & Plan Note (Addendum)
 The patient has longstanding neuropathy in her feet/legs and fingertips. Etiology is unclear. Monitor.

## 2024-04-13 NOTE — Assessment & Plan Note (Addendum)
 The patient has had an elevated serum kappa light chain for over a year and more recently developed a mildly elevated serum lambda light chain.  The kappa/lambda ratio is normal.  SPEP shows no M spike.  Immunofixation shows a mildly elevated IgA level and light chain specificity could not be verified.  24-hour urine from 12/19/2023 did not reveal evidence of an M spike in the urine.  Repeat labs from 03/29/2024 show elevated kappa free light chains at 21.2 but normal lambda free light chains and kappa lambda light chain ratio.  Immunofixation shows a faint band noted in IgA but no light chain specificity could be verified.  Recommend retesting in 4 to 6 months. Protein electrophoresis did not reveal evidence of M spike.  No crab criteria.  She has no anemia, hypercalcemia or renal dysfunction.  Patient reports night sweats almost every night where she sweats through her pajamas but not through her sheets.  Reports this has been going on for years.  Appetite is stable.  No fevers.  She has lost 25 pounds according to her scale due to Mounjaro .  Return to clinic in 6 months with labs a few days before.

## 2024-04-14 ENCOUNTER — Encounter: Payer: Self-pay | Admitting: *Deleted

## 2024-04-14 NOTE — Progress Notes (Signed)
 Message left for patient to call back.  Information also sent via [facility].

## 2024-04-15 ENCOUNTER — Ambulatory Visit: Admitting: Neurology

## 2024-04-15 NOTE — Progress Notes (Signed)
 Patient aware and verbalized understanding.

## 2024-04-26 ENCOUNTER — Other Ambulatory Visit: Payer: Self-pay | Admitting: Nurse Practitioner

## 2024-04-27 ENCOUNTER — Ambulatory Visit: Payer: Self-pay | Admitting: Neurology

## 2024-04-27 VITALS — BP 124/76 | HR 61 | Wt 208.6 lb

## 2024-04-27 DIAGNOSIS — G250 Essential tremor: Secondary | ICD-10-CM

## 2024-04-27 DIAGNOSIS — R202 Paresthesia of skin: Secondary | ICD-10-CM | POA: Diagnosis not present

## 2024-04-27 DIAGNOSIS — G40909 Epilepsy, unspecified, not intractable, without status epilepticus: Secondary | ICD-10-CM | POA: Diagnosis not present

## 2024-04-27 MED ORDER — PRIMIDONE 50 MG PO TABS: 50.0000 mg | ORAL_TABLET | Freq: Two times a day (BID) | ORAL | 1 refills | Status: AC

## 2024-04-27 NOTE — Patient Instructions (Addendum)
" °  VISIT SUMMARY: During your visit, we discussed the worsening of your hand tremor and persistent numbness in your fingers. We reviewed your history of essential tremor and diabetic polyneuropathy, and we addressed your current symptoms and treatment options.  YOUR PLAN: -ESSENTIAL TREMOR: Essential tremor is a nervous system disorder that causes involuntary and rhythmic shaking. We have decided to start you on primidone  to help manage your tremor since you could not tolerate propranolol  due to low heart rate.  -DIABETIC POLYNEUROPATHY: Diabetic polyneuropathy is a type of nerve damage that can occur with diabetes, leading to numbness and tingling in the affected areas. We have ordered an EMG of your upper and lower extremities to evaluate the extent of your neuropathy and to check for other possible conditions like carpal tunnel syndrome or cervical radiculopathy.  INSTRUCTIONS: Please start taking primidone  as prescribed for your tremor. We have scheduled an EMG for your upper and lower extremities to further evaluate your neuropathy and other potential conditions. Follow up with us  after the EMG for further assessment and management.  Start primidone  50 mg - 1/2 tablet at bedtime for 1 week and then increase to 1 tablet at bedtime x 1 week, then 1 in the AM and 1 in the evening thereafter.     Contains text generated by [employer].   "

## 2024-04-27 NOTE — Progress Notes (Signed)
 "   Assessment/Plan:    1.  Essential Tremor.             - Prior propranolol  10 mg without benefit, but has been bradycardic and cannot increase  - Start primidone  and work to 50 mg twice per day.  Discussed risk, benefits, side effects 2.  Chronic low back pain             - Patient used to follow with pain management.  Now following with primary care for pain management.  On hydrocodone .  No longer on Lyrica    3.  Remote history of seizure             - Last seizure was 1992.             - Patient on chronic Dilantin              - Last Dilantin  level was in 14.3 in August, 2025  4.  Hand and feet paresthesias  - Was initially just going to do EMG of the upper extremities to rule out coexisting entrapment neuropathy, in the face of probable diabetic neuropathy, but she asked if we could go ahead and look at existence of diabetic neuropathy as well.  Subjective:   Deborah Carroll was seen today in follow up for essential tremor.  My previous records were reviewed prior to todays visit.  We started to start her on primidone , 50 mg twice per day last visit, but I also told her that this may not be enough dosage.  She reports today that she didn't go on it as pcp thought that it would interact with the lyrica .  However, she is no longer on the lyrica .    Separately, she was recently sent to hematology for elevated kappa free light chains but the kappa/lambda ratio was unremarkable.  No M spike was noted.  She is just being monitored.  She does report having hand paresthesias and dropping objects.  When asked if she is diabetic she states that she was until she started taking Mounjaro .  Her A1c is very well-controlled now at 5.5.  She does state that her feet are numb as well.  Current prescribed movement disorder medications: Primidone , 50 mg twice per day (reports not taking)   PREVIOUS MEDICATIONS: Propranolol  10 mg  ALLERGIES:  No Known Allergies  CURRENT MEDICATIONS:  Current Meds   Medication Sig   dicyclomine  (BENTYL ) 10 MG capsule Take 1 capsule by mouth three times daily as needed   hydrochlorothiazide  (HYDRODIURIL ) 25 MG tablet Take 1 tablet (25 mg total) by mouth daily.   HYDROcodone -acetaminophen  (NORCO) 10-325 MG tablet Take one tab po 4 times daily as needed for severe pain   HYDROcodone -acetaminophen  (NORCO) 10-325 MG tablet Take one tab po 4 times daily as needed for severe pain   HYDROcodone -acetaminophen  (NORCO) 10-325 MG tablet Take one tab po 4 times daily as needed for severe pain   metFORMIN  (GLUCOPHAGE -XR) 500 MG 24 hr tablet Take 1 tablet by mouth once daily   MOUNJARO  7.5 MG/0.5ML Pen INJECT 7.5MG  SUBCUTANEOUSLY  ONCE A WEEK   pantoprazole  (PROTONIX ) 40 MG tablet Take 1 tablet (40 mg total) by mouth daily.   phenytoin  (DILANTIN ) 100 MG ER capsule TAKE 2 CAPSULES BY MOUTH TWICE DAILY IN THE MORNING AND IN THE EVENING   Potassium Chloride  ER 20 MEQ TBCR Take 1 tablet by mouth twice daily   primidone  (MYSOLINE ) 50 MG tablet Take 1 tablet (50 mg total) by mouth  2 (two) times daily.   propranolol  (INDERAL ) 10 MG tablet Take 1 tablet by mouth twice daily   rosuvastatin  (CRESTOR ) 5 MG tablet Take 1 tablet by mouth once daily   sertraline  (ZOLOFT ) 100 MG tablet Take 2 tablets (200 mg total) by mouth daily.   torsemide  (DEMADEX ) 20 MG tablet Take 1 tablet (20 mg total) by mouth daily as needed (fluid or swelling).      Objective:    PHYSICAL EXAMINATION:    VITALS:   Vitals:   04/27/24 1252  BP: 124/76  Pulse: 61  SpO2: 98%  Weight: 208 lb 9.6 oz (94.6 kg)    GEN:  The patient appears stated age and is in NAD. HEENT:  Normocephalic, atraumatic.  The mucous membranes are moist. The superficial temporal arteries are without ropiness or tenderness. CV:  RRR Lungs:  CTAB Neck/HEME:  There are no carotid bruits bilaterally.  Neurological examination:  Orientation: The patient is alert and oriented x3. Cranial nerves: There is good facial  symmetry. The speech is fluent and clear. Soft palate rises symmetrically and there is no tongue deviation. Hearing is intact to conversational tone. Sensation: Sensation is intact to light touch throughout Motor: Strength is at least antigravity x4.  Movement examination: Tone: There is nl tone in the bilateral upper extremities.  The tone in the lower extremities is nl.  Abnormal movements: no rest tremor.  Min postural tremor.  There is intention tremor, overall more mild today.  She has trouble with Archimedes spirals on the right  Coordination:  There is no decremation with RAM's, with any form of RAMS, including alternating supination and pronation of the forearm, hand opening and closing, finger taps, heel taps and toe taps.  Gait and Station: The patient has no difficulty arising out of a deep-seated chair without the use of the hands. The patient's stride length is good as she ambulates out of the office I have reviewed and interpreted the following labs independently   Chemistry      Component Value Date/Time   NA 142 03/29/2024 1107   NA 143 11/27/2023 1057   K 3.6 04/13/2024 1206   CL 102 03/29/2024 1107   CO2 30 03/29/2024 1107   BUN 8 03/29/2024 1107   BUN 9 11/27/2023 1057   CREATININE 0.63 03/29/2024 1107   CREATININE 0.66 05/16/2014 1023      Component Value Date/Time   CALCIUM  9.0 03/29/2024 1107   ALKPHOS 124 03/29/2024 1107   AST 34 03/29/2024 1107   ALT 28 03/29/2024 1107   BILITOT 0.4 03/29/2024 1107   BILITOT 0.4 11/27/2023 1057      Lab Results  Component Value Date   WBC 12.6 (H) 03/29/2024   HGB 13.3 03/29/2024   HCT 40.3 03/29/2024   MCV 89.8 03/29/2024   PLT 251 03/29/2024   Lab Results  Component Value Date   TSH 1.760 10/17/2021     Chemistry      Component Value Date/Time   NA 142 03/29/2024 1107   NA 143 11/27/2023 1057   K 3.6 04/13/2024 1206   CL 102 03/29/2024 1107   CO2 30 03/29/2024 1107   BUN 8 03/29/2024 1107   BUN 9  11/27/2023 1057   CREATININE 0.63 03/29/2024 1107   CREATININE 0.66 05/16/2014 1023      Component Value Date/Time   CALCIUM  9.0 03/29/2024 1107   ALKPHOS 124 03/29/2024 1107   AST 34 03/29/2024 1107   ALT 28 03/29/2024 1107  BILITOT 0.4 03/29/2024 1107   BILITOT 0.4 11/27/2023 1057     Lab Results  Component Value Date   HGBA1C 5.5 03/29/2024     Cc:  [name] LABOR, MD   "

## 2024-05-05 ENCOUNTER — Other Ambulatory Visit: Payer: Self-pay | Admitting: Family Medicine

## 2024-06-22 ENCOUNTER — Encounter: Payer: Self-pay | Admitting: Neurology

## 2024-06-30 ENCOUNTER — Ambulatory Visit: Admitting: Family Medicine

## 2024-08-12 ENCOUNTER — Ambulatory Visit: Admitting: Neurology

## 2024-10-12 ENCOUNTER — Inpatient Hospital Stay

## 2024-10-19 ENCOUNTER — Inpatient Hospital Stay: Admitting: Oncology

## 2024-10-28 ENCOUNTER — Ambulatory Visit: Payer: Self-pay | Admitting: Neurology

## 2024-12-24 ENCOUNTER — Ambulatory Visit
